# Patient Record
Sex: Female | Born: 1995 | Race: White | Hispanic: Yes | Marital: Single | Smoking: Never smoker
Health system: Southern US, Community
[De-identification: ages and names within clinical notes are randomized; demographics above are authoritative.]

## PROBLEM LIST (undated history)

## (undated) DIAGNOSIS — E669 Obesity, unspecified: Secondary | ICD-10-CM

## (undated) DIAGNOSIS — K603 Anal fistula, unspecified: Secondary | ICD-10-CM

## (undated) DIAGNOSIS — M549 Dorsalgia, unspecified: Secondary | ICD-10-CM

## (undated) DIAGNOSIS — O24419 Gestational diabetes mellitus in pregnancy, unspecified control: Secondary | ICD-10-CM

## (undated) DIAGNOSIS — N83209 Unspecified ovarian cyst, unspecified side: Secondary | ICD-10-CM

## (undated) DIAGNOSIS — D649 Anemia, unspecified: Secondary | ICD-10-CM

## (undated) DIAGNOSIS — N200 Calculus of kidney: Secondary | ICD-10-CM

## (undated) DIAGNOSIS — G8929 Other chronic pain: Secondary | ICD-10-CM

## (undated) HISTORY — DX: Obesity, unspecified: E66.9

## (undated) HISTORY — DX: Other chronic pain: G89.29

## (undated) HISTORY — DX: Anemia, unspecified: D64.9

## (undated) HISTORY — PX: TONSILLECTOMY: SUR1361

## (undated) HISTORY — PX: ABDOMINAL SURGERY: SHX537

## (undated) HISTORY — PX: ADENOIDECTOMY: SUR15

## (undated) HISTORY — DX: Dorsalgia, unspecified: M54.9

---
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
## 2013-01-14 ENCOUNTER — Encounter (HOSPITAL_COMMUNITY): Payer: Self-pay | Admitting: Nurse Practitioner

## 2013-01-14 ENCOUNTER — Emergency Department (HOSPITAL_COMMUNITY)
Admission: EM | Admit: 2013-01-14 | Discharge: 2013-01-14 | Disposition: A | Discharge status: Home / Self Care | Payer: Medicaid Other | Attending: Emergency Medicine | Admitting: Emergency Medicine

## 2013-01-14 DIAGNOSIS — IMO0002 Reserved for concepts with insufficient information to code with codable children: Secondary | ICD-10-CM | POA: Insufficient documentation

## 2013-01-14 DIAGNOSIS — M5417 Radiculopathy, lumbosacral region: Secondary | ICD-10-CM

## 2013-01-14 DIAGNOSIS — R209 Unspecified disturbances of skin sensation: Secondary | ICD-10-CM | POA: Insufficient documentation

## 2013-01-14 MED ORDER — TRAMADOL HCL 50 MG PO TABS: 50.0000 mg | ORAL_TABLET | Freq: Four times a day (QID) | ORAL | Status: DC | PRN

## 2013-01-14 MED ORDER — IBUPROFEN 600 MG PO TABS: 600.0000 mg | ORAL_TABLET | Freq: Four times a day (QID) | ORAL | Status: DC | PRN

## 2013-01-14 MED ORDER — METHOCARBAMOL 500 MG PO TABS: 1000.0000 mg | ORAL_TABLET | Freq: Four times a day (QID) | ORAL | Status: DC

## 2013-01-14 NOTE — ED Notes (Signed)
Pt c/o lower back pain radiating down R leg. History of back pain was initially treated 3 years ago and it resolved but has been recurring this year. Taking ibuprofen with no relief. Denies new injuries. ambulatory

## 2013-01-14 NOTE — ED Provider Notes (Signed)
Medical screening examination/treatment/procedure(s) were performed by non-physician practitioner and as supervising physician I was immediately available for consultation/collaboration.   Rannie Craney, MD 01/14/13 2324 

## 2013-01-14 NOTE — ED Provider Notes (Signed)
History    This chart was scribed for non-physician practitioner working with Dione Booze, MD by Frederik Pear, ED Scribe. This patient was seen in room TR10C/TR10C and the patient's care was started at 1836.   CSN: [id number]  Arrival date & time 01/14/13  1756   First MD Initiated Contact with Patient 01/14/13 1836      Chief Complaint  Patient presents with  . Back Pain    (Consider location/radiation/quality/duration/timing/severity/associated sxs/prior treatment) The history is provided by the patient. No language interpreter was used.    Isabel Conner is a 17 y.o. female with no h/o of back surgeries who presents to the Emergency Department complaining of gradually worsening, waxing and waning, shooting right lower back pain that is aggravated by lifting her neck, which causes the pain to radiate down her right leg and initially began over the summer, but has significantly worsened and become constant today after work. She reports that she initially injured the area 4 years ago, but the pain went away and didn't reappear until this past summer. She denies any h/o of cancer or use of IV drugs. She reports that she has treated the pain with ibuprofen with no relief. She is allergic to amoxicillin and penicillin.  History reviewed. No pertinent past medical history.  History reviewed. No pertinent past surgical history.  History reviewed. No pertinent family history.  History  Substance Use Topics  . Smoking status: Never Smoker   . Smokeless tobacco: Not on file  . Alcohol Use: No    OB History   Grav Para Term Preterm Abortions TAB SAB Ect Mult Living                  Review of Systems  Constitutional: Negative for fever and unexpected weight change.  Gastrointestinal: Negative for constipation.       Negative for fecal incontinence.   Genitourinary: Negative for dysuria, hematuria, flank pain, vaginal bleeding, vaginal discharge and pelvic pain.       Negative for  urinary incontinence or retention.  Musculoskeletal: Positive for back pain.  Neurological: Positive for numbness. Negative for weakness.       Denies saddle paresthesias.  All other systems reviewed and are negative.    Allergies  Amoxicillin and Penicillins  Home Medications   Current Outpatient Rx  Name  Route  Sig  Dispense  Refill  . ibuprofen (ADVIL,MOTRIN) 200 MG tablet   Oral   Take 400-600 mg by mouth every 6 (six) hours as needed for pain.           BP 116/67  Pulse 87  Temp(Src) 99.2 F (37.3 C) (Oral)  Resp 18  SpO2 100%  Physical Exam  Nursing note and vitals reviewed. Constitutional: She appears well-developed and well-nourished. No distress.  Awake, alert, nontoxic appearance.  HENT:  Head: Normocephalic and atraumatic.  Eyes: Conjunctivae and EOM are normal. Pupils are equal, round, and reactive to light.  Neck: Normal range of motion. Neck supple. No tracheal deviation present.  Cardiovascular: Normal rate.   Pulmonary/Chest: Effort normal. No respiratory distress. She exhibits no tenderness.  Abdominal: Soft. She exhibits no distension. There is no tenderness. There is no rebound and no CVA tenderness.  Musculoskeletal: Normal range of motion. She exhibits no edema and no tenderness.  Pain over the right lumbar paraspinous muscles.  Normal DTRs. Normal sensation.  Neurological: She is alert. She has normal strength. She displays normal reflexes. No sensory deficit.  5/5 strength in entire  lower extremities bilaterally. No sensation deficit.   Skin: Skin is warm and dry. No rash noted.  Psychiatric: She has a normal mood and affect. Her behavior is normal.    ED Course  Procedures (including critical care time)  DIAGNOSTIC STUDIES: Oxygen Saturation is 100% on room air, normal by my interpretation.    COORDINATION OF CARE:  19:16- Discussed planned course of treatment with the patient, including Ultram, Advil, and Robaxin, who is agreeable at  this time.   Labs Reviewed - No data to display No results found.   1. Lumbosacral radiculopathy     Vital signs reviewed and are as follows: Filed Vitals:   01/14/13 1814  BP: 116/67  Pulse: 87  Temp: 99.2 F (37.3 C)  Resp: 18   No red flag s/s of low back pain. Patient was counseled on back pain precautions and told to do activity as tolerated but do not lift, push, or pull heavy objects more than 10 pounds for the next week.  Patient counseled to use ice or heat on back for no longer than 15 minutes every hour.   Patient prescribed muscle relaxer and counseled on proper use of muscle relaxant medication.    Patient prescribed narcotic pain medicine and counseled on proper use of narcotic pain medications. Counseled not to combine this medication with others containing tylenol.   Urged patient not to drink alcohol, drive, or perform any other activities that requires focus while taking either of these medications.  Patient urged to follow-up with PCP if pain does not improve with treatment and rest or if pain becomes recurrent. Urged to return with worsening severe pain, loss of bowel or bladder control, trouble walking.   The patient verbalizes understanding and agrees with the plan.   MDM  Patient with radicular type back pain. No neurological deficits. Patient is ambulatory. No warning symptoms of back pain including: loss of bowel or bladder control, night sweats, waking from sleep with back pain, unexplained fevers or weight loss, h/o cancer, IVDU, recent trauma. No concern for cauda equina, epidural abscess, or other serious cause of back pain. Conservative measures such as rest, ice/heat and pain medicine indicated with PCP follow-up if no improvement with conservative management.    I personally performed the services described in this documentation, which was scribed in my presence. The recorded information has been reviewed and is accurate.    Renne Crigler,  [place] 01/14/13 2316

## 2013-01-17 ENCOUNTER — Ambulatory Visit: Payer: Medicaid Other | Admitting: *Deleted

## 2013-01-25 ENCOUNTER — Encounter: Payer: Self-pay | Admitting: *Deleted

## 2013-01-25 ENCOUNTER — Encounter: Payer: Medicaid Other | Attending: Family Medicine | Admitting: *Deleted

## 2013-01-25 DIAGNOSIS — Z713 Dietary counseling and surveillance: Secondary | ICD-10-CM | POA: Insufficient documentation

## 2013-01-25 DIAGNOSIS — E669 Obesity, unspecified: Secondary | ICD-10-CM | POA: Insufficient documentation

## 2013-01-25 NOTE — Patient Instructions (Addendum)
Plan:  Aim for 4 Carb Choices per meal (60 grams) +/- 1 either way  Aim for 0-2 Carbs per snack if hungry  Consider reading food labels for Total Carbohydrate of foods Consider  increasing your activity level by swimming or walking for 15-30 minutes daily as tolerated Consider decreasing soda and increase water as tolerated

## 2013-01-25 NOTE — Progress Notes (Signed)
  Medical Nutrition Therapy:  Appt start time: 1030 end time:  1130.  Assessment:  Primary concerns today: patient here for obesity. Would like a plan to follow for weight loss to goal of 150 pounds. Lives with father and little brother and temporarily her grandmother. Father shops for food, patient or grandmother prepare meals. Sleeps from 11 PM to 6:30 AM. 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 Work times vary from 16 to 24 hours a week. At home when not working so no schedule to follow for most of the week. Now that weather is better, she is starting to walk more. Prefers outside activities, enjoys swimming. Would also prefer a class over individual. She states history of vegetarian diet, lost 30 pounds last Fall for about a month, but stopped following because she got tired of the food choices and had to cook every day.   MEDICATIONS: see list   DIETARY INTAKE:  Usual eating pattern includes 2 meals and 2 snacks per day.  Everyday foods include 2-3.  Avoided foods include 1-2.    24-hr recall:  B ( AM): skips often  Snk ( AM): occasionally chips or cookies  L ( PM): grandmother cooks: eggs with sliced hotdogs OR spaghetti soup, 12 oz regular soda from 2 liter bottle Snk ( PM): none D ( PM): eggs often, beans, rice or pasta, corn tortillas, 12 oz regular soda Snk ( PM): if cake available OR chocolate milk Beverages: regular soda, chocolate milk, occasional sweet tea  Usual physical activity: walking when weather is nice, lifting boxes at work  Estimated energy needs: 1800 calories 200 g carbohydrates 135 g protein 50 g fat  Progress Towards Goal(s):  In progress.   Nutritional Diagnosis:  NI-1.5 Excessive energy intake As related to activity level.  As evidenced by BMI of 36.1.    Intervention:  Nutrition counseling for weight loss initiated. Discussed use of Carb Counting as method for portion control. Taught her how to read food labels  and we discussed the benefits of increased activity. She was surprised at the amount of carbohydrate in her regular sodas and how many empty calories they contain.  Plan:  Aim for 4 Carb Choices per meal (60 grams) +/- 1 either way  Aim for 0-2 Carbs per snack if hungry  Consider reading food labels for Total Carbohydrate of foods Consider  increasing your activity level by swimming or walking for 15-30 minutes daily as tolerated Consider decreasing soda and increase water as tolerated  Handouts given during visit include: Carb Counting and Food Label handouts Meal Plan Card   Monitoring/Evaluation:  Dietary intake, exercise, reading food labels, and body weight in 5 week(s).

## 2013-02-23 ENCOUNTER — Encounter: Payer: Self-pay | Admitting: Family Medicine

## 2013-02-23 DIAGNOSIS — E669 Obesity, unspecified: Secondary | ICD-10-CM | POA: Insufficient documentation

## 2013-02-23 DIAGNOSIS — M549 Dorsalgia, unspecified: Secondary | ICD-10-CM | POA: Insufficient documentation

## 2013-02-26 ENCOUNTER — Ambulatory Visit (INDEPENDENT_AMBULATORY_CARE_PROVIDER_SITE_OTHER): Payer: Medicaid Other | Admitting: Physician Assistant

## 2013-02-26 VITALS — BP 126/70 | HR 72 | Temp 98.2°F | Resp 18 | Ht 63.0 in | Wt 204.0 lb

## 2013-02-26 DIAGNOSIS — IMO0001 Reserved for inherently not codable concepts without codable children: Secondary | ICD-10-CM

## 2013-02-26 DIAGNOSIS — Z309 Encounter for contraceptive management, unspecified: Secondary | ICD-10-CM

## 2013-02-26 MED ORDER — DESOGESTREL-ETHINYL ESTRADIOL 0.15-0.02/0.01 MG (21/5) PO TABS: 1.0000 | ORAL_TABLET | Freq: Every day | ORAL | Status: DC

## 2013-02-27 NOTE — Progress Notes (Signed)
   Patient ID: Isabel Conner MRN: [id number], DOB: 04/20/1996, 17 y.o. Date of Encounter: 02/27/2013, 9:37 AM    Chief Complaint:  Chief Complaint  Patient presents with  . wants to discuss birth control     HPI: 17 y.o. year old female has never used any type of contaception. Wants to start contraception. Is currently on menses-started 4 days ago.  Menses occur regularly Q 27-29 days. She has no significant pain/cramping with menses and has "normal" amount of bleeding.  No h/o blood clots. No family h/o blood clot. She does not smoke. Does not have HTN.     Home Meds: Current Outpatient Prescriptions on File Prior to Visit  Medication Sig Dispense Refill  . ibuprofen (ADVIL,MOTRIN) 600 MG tablet Take 1 tablet (600 mg total) by mouth every 6 (six) hours as needed for pain.  20 tablet  0  . methocarbamol (ROBAXIN) 500 MG tablet Take 2 tablets (1,000 mg total) by mouth 4 (four) times daily.  20 tablet  0  . traMADol (ULTRAM) 50 MG tablet Take 1 tablet (50 mg total) by mouth every 6 (six) hours as needed for pain.  15 tablet  0   No current facility-administered medications on file prior to visit.    Allergies:  Allergies  Allergen Reactions  . Amoxicillin Anaphylaxis  . Penicillins Anaphylaxis      Review of Systems: Constitutional: negative for chills, fever, night sweats, weight changes, or fatigue  HEENT: negative for vision changes, hearing loss, congestion, rhinorrhea, ST, epistaxis, or sinus pressure Cardiovascular: negative for chest pain or palpitations Respiratory: negative for hemoptysis, wheezing, shortness of breath, or cough Abdominal: negative for abdominal pain, nausea, vomiting, diarrhea, or constipation Dermatological: negative for rash Neurologic: negative for headache, dizziness, or syncope    Physical Exam: Blood pressure 126/70, pulse 72, temperature 98.2 F (36.8 C), temperature source Oral, resp. rate 18, height 5\' 3"  (1.6 m), weight 204 lb  (92.534 kg), last menstrual period 02/22/2013., Body mass index is 36.15 kg/(m^2). General: Well developed, well nourished, in no acute distress. Neck: Supple. No thyromegaly. Full ROM. No lymphadenopathy. Lungs: Clear bilaterally to auscultation without wheezes, rales, or rhonchi. Breathing is unlabored. Heart: RRR with S1 S2. No murmurs, rubs, or gallops appreciated. Abdomen: Soft, non-tender, non-distended with normoactive bowel sounds. No hepatomegaly. No rebound/guarding. No obvious abdominal masses. Msk:  Strength and tone normal for age. Extremities/Skin: Warm and dry. No clubbing or cyanosis. No edema. No rashes or suspicious lesions. Neuro: Alert and oriented X 3. Moves all extremities spontaneously. Gait is normal. CNII-XII grossly in tact. Psych:  Responds to questions appropriately with a normal affect.     ASSESSMENT AND PLAN:  17 y.o. year old female with  1. Contraception Discussed options. She chooses OCT/BCP. I discussed to take daily at same time of day. Discussed when to expect menses, etc. She voices understanding. Will need to start pap smear at age 17. - desogestrel-ethinyl estradiol (KARIVA,AZURETTE,MIRCETTE) 0.15-0.02/0.01 MG (21/5) tablet; Take 1 tablet by mouth daily.  Dispense: 1 Package; Refill: [address] [place], [place], [facility] 02/27/2013 9:37 AM

## 2013-03-05 ENCOUNTER — Ambulatory Visit: Payer: Medicaid Other | Admitting: *Deleted

## 2013-04-03 ENCOUNTER — Encounter (HOSPITAL_COMMUNITY): Payer: Self-pay | Admitting: *Deleted

## 2013-04-03 ENCOUNTER — Emergency Department (HOSPITAL_COMMUNITY)
Admission: EM | Admit: 2013-04-03 | Discharge: 2013-04-04 | Disposition: A | Discharge status: Home / Self Care | Payer: Medicaid Other | Attending: Emergency Medicine | Admitting: Emergency Medicine

## 2013-04-03 DIAGNOSIS — R509 Fever, unspecified: Secondary | ICD-10-CM | POA: Insufficient documentation

## 2013-04-03 DIAGNOSIS — Z8739 Personal history of other diseases of the musculoskeletal system and connective tissue: Secondary | ICD-10-CM | POA: Insufficient documentation

## 2013-04-03 DIAGNOSIS — J3489 Other specified disorders of nose and nasal sinuses: Secondary | ICD-10-CM | POA: Insufficient documentation

## 2013-04-03 DIAGNOSIS — E669 Obesity, unspecified: Secondary | ICD-10-CM | POA: Insufficient documentation

## 2013-04-03 DIAGNOSIS — H9191 Unspecified hearing loss, right ear: Secondary | ICD-10-CM

## 2013-04-03 DIAGNOSIS — J329 Chronic sinusitis, unspecified: Secondary | ICD-10-CM | POA: Insufficient documentation

## 2013-04-03 DIAGNOSIS — H902 Conductive hearing loss, unspecified: Secondary | ICD-10-CM | POA: Insufficient documentation

## 2013-04-03 DIAGNOSIS — R0982 Postnasal drip: Secondary | ICD-10-CM | POA: Insufficient documentation

## 2013-04-03 DIAGNOSIS — J029 Acute pharyngitis, unspecified: Secondary | ICD-10-CM | POA: Insufficient documentation

## 2013-04-03 DIAGNOSIS — H9209 Otalgia, unspecified ear: Secondary | ICD-10-CM | POA: Insufficient documentation

## 2013-04-03 MED ORDER — CLARITHROMYCIN 250 MG PO TABS
250.0000 mg | ORAL_TABLET | Freq: Once | ORAL | Status: AC
  Administered 2013-04-04: 250 mg via ORAL
  Filled 2013-04-03: qty 1

## 2013-04-03 MED ORDER — CLARITHROMYCIN 250 MG PO TABS: 500.0000 mg | ORAL_TABLET | Freq: Two times a day (BID) | ORAL | Status: DC

## 2013-04-03 NOTE — ED Provider Notes (Signed)
History     CSN: [id number]  Arrival date & time 04/03/13  2248   First MD Initiated Contact with Patient 04/03/13 2327      Chief Complaint  Patient presents with  . Headache    (Consider location/radiation/quality/duration/timing/severity/associated sxs/prior treatment) HPI Comments: Patient with a history of seasonal allergies, has been taking her normal.  Allergy, medication for the last 2-3, weeks.  She has had increased congestion, facial pain, decreased hearing in her right ear, low-grade fever.  She has tried taking multiple over-the-counter allergy medicines as well, without any relief.  She is a dull, frontal headache.  Denies individual changes  Patient is a 17 y.o. female presenting with headaches. The history is provided by the patient.  Headache Pain location:  Frontal Quality:  Dull Radiates to:  Does not radiate Severity currently:  5/10 Severity at highest:  5/10 Onset quality:  Gradual Duration:  2 weeks Timing:  Constant Chronicity:  Recurrent Relieved by:  Nothing Associated symptoms: congestion, drainage, ear pain, fever, hearing loss and sore throat   Associated symptoms: no blurred vision, no dizziness and no neck pain   Congestion:    Location:  Nasal   Interferes with sleep: yes     Interferes with eating/drinking: no     Past Medical History  Diagnosis Date  . Obesity   . Obesity   . Chronic back pain     History reviewed. No pertinent past surgical history.  No family history on file.  History  Substance Use Topics  . Smoking status: Never Smoker   . Smokeless tobacco: Never Used  . Alcohol Use: No    OB History   Grav Para Term Preterm Abortions TAB SAB Ect Mult Living                  Review of Systems  Constitutional: Positive for fever. Negative for chills.  HENT: Positive for hearing loss, ear pain, congestion, sore throat, rhinorrhea and postnasal drip. Negative for neck pain.   Eyes: Negative for blurred vision.  Skin:  Negative for pallor.  Neurological: Positive for headaches. Negative for dizziness and weakness.  All other systems reviewed and are negative.    Allergies  Amoxicillin and Penicillins  Home Medications   Current Outpatient Rx  Name  Route  Sig  Dispense  Refill  . desogestrel-ethinyl estradiol (KARIVA,AZURETTE,MIRCETTE) 0.15-0.02/0.01 MG (21/5) tablet   Oral   Take 1 tablet by mouth daily.   1 Package   11   . ibuprofen (ADVIL,MOTRIN) 600 MG tablet   Oral   Take 1 tablet (600 mg total) by mouth every 6 (six) hours as needed for pain.   20 tablet   0   . Loratadine 10 MG CAPS   Oral   Take 10 mg by mouth daily.         . methocarbamol (ROBAXIN) 500 MG tablet   Oral   Take 2 tablets (1,000 mg total) by mouth 4 (four) times daily.   20 tablet   0   . traMADol (ULTRAM) 50 MG tablet   Oral   Take 1 tablet (50 mg total) by mouth every 6 (six) hours as needed for pain.   15 tablet   0     BP 121/75  Pulse 104  Temp(Src) 98.7 F (37.1 C)  Resp 20  SpO2 98%  Physical Exam  Nursing note and vitals reviewed. Constitutional: She is oriented to person, place, and time. She appears well-developed and well-nourished.  HENT:  Head: Normocephalic and atraumatic.  Right Ear: No lacerations. No drainage, swelling or tenderness. A middle ear effusion is present. Decreased hearing is noted.  Left Ear: External ear normal.  Mouth/Throat: Oropharynx is clear and moist.  Eyes: Pupils are equal, round, and reactive to light.  Neck: Normal range of motion.  Cardiovascular: Normal rate.   Abdominal: Soft.  Musculoskeletal: Normal range of motion.  Neurological: She is alert and oriented to person, place, and time.  Skin: Skin is warm.    ED Course  Procedures (including critical care time)  Labs Reviewed - No data to display No results found.   No diagnosis found.    MDM   We'll treat patient for sinusitis, with antibiotic, with her my assistant to to her  penicillin allergy, but her sugar continue her ranitidine on a regular basis.  I will refer her to ENT to evaluate her right ear is healing which has been ongoing for the past one to 2 years        Arman Filter, NP 04/03/13 2343

## 2013-04-03 NOTE — ED Notes (Signed)
The pt is c/o a headache and not able to breath through her nose for 2-3 days

## 2013-04-04 NOTE — ED Provider Notes (Signed)
Medical screening examination/treatment/procedure(s) were performed by non-physician practitioner and as supervising physician I was immediately available for consultation/collaboration.  John-Adam Janith Nielson, M.D.    John-Adam Odell Fasching, MD 04/04/13 0732 

## 2013-04-24 ENCOUNTER — Encounter (HOSPITAL_COMMUNITY): Payer: Self-pay | Admitting: Adult Health

## 2013-04-24 ENCOUNTER — Emergency Department (HOSPITAL_COMMUNITY)
Admission: EM | Admit: 2013-04-24 | Discharge: 2013-04-24 | Disposition: A | Discharge status: Home / Self Care | Payer: Medicaid Other | Attending: Emergency Medicine | Admitting: Emergency Medicine

## 2013-04-24 DIAGNOSIS — Z3202 Encounter for pregnancy test, result negative: Secondary | ICD-10-CM | POA: Insufficient documentation

## 2013-04-24 DIAGNOSIS — E669 Obesity, unspecified: Secondary | ICD-10-CM | POA: Insufficient documentation

## 2013-04-24 DIAGNOSIS — M549 Dorsalgia, unspecified: Secondary | ICD-10-CM | POA: Insufficient documentation

## 2013-04-24 DIAGNOSIS — R51 Headache: Secondary | ICD-10-CM | POA: Insufficient documentation

## 2013-04-24 DIAGNOSIS — G8929 Other chronic pain: Secondary | ICD-10-CM | POA: Insufficient documentation

## 2013-04-24 DIAGNOSIS — Z79899 Other long term (current) drug therapy: Secondary | ICD-10-CM | POA: Insufficient documentation

## 2013-04-24 MED ORDER — IBUPROFEN 200 MG PO TABS
400.0000 mg | ORAL_TABLET | Freq: Once | ORAL | Status: AC
  Administered 2013-04-24: 400 mg via ORAL
  Filled 2013-04-24: qty 2

## 2013-04-24 MED ORDER — KETOROLAC TROMETHAMINE 30 MG/ML IJ SOLN: 30.0000 mg | Freq: Once | INTRAMUSCULAR | Status: DC

## 2013-04-24 MED ORDER — OXYCODONE-ACETAMINOPHEN 5-325 MG PO TABS
1.0000 | ORAL_TABLET | Freq: Once | ORAL | Status: AC
  Administered 2013-04-24: 1 via ORAL
  Filled 2013-04-24: qty 1

## 2013-04-24 MED ORDER — DIPHENHYDRAMINE HCL 50 MG/ML IJ SOLN: 25.0000 mg | Freq: Once | INTRAMUSCULAR | Status: DC

## 2013-04-24 MED ORDER — METOCLOPRAMIDE HCL 5 MG/ML IJ SOLN
10.0000 mg | Freq: Once | INTRAMUSCULAR | Status: AC
  Administered 2013-04-24: 10 mg via INTRAMUSCULAR
  Filled 2013-04-24: qty 2

## 2013-04-24 MED ORDER — DEXAMETHASONE SODIUM PHOSPHATE 10 MG/ML IJ SOLN
10.0000 mg | Freq: Once | INTRAMUSCULAR | Status: AC
  Administered 2013-04-24: 10 mg via INTRAMUSCULAR
  Filled 2013-04-24: qty 1

## 2013-04-24 NOTE — ED Notes (Signed)
Pt ambulating independently w/ steady gait on d/c in no acute distress, A&Ox4.D/c instructions reviewed w/ pt and family - pt and family deny any further questions or concerns at present.  

## 2013-04-24 NOTE — ED Notes (Addendum)
Presents with headache that began last night. Pt described headache as mild and got better with better with Ibuprofen, this morning woke up with a worse headache and light and sound sensitivity. Headache described as throbbing located behind eyes and in temples and radiation into both ears. Pt alert and oreinted, MAEx4. No drift or droop.

## 2013-04-24 NOTE — ED Notes (Signed)
NURSE FIRST ROUNDS : NURSE EXPLAINED DELAY , WAIT TIME AND PROCESS , DENIES PAIN AT THIS TIME , RESPIRATIONS UNLABORED .

## 2013-04-24 NOTE — ED Provider Notes (Signed)
History     CSN: [id number]  Arrival date & time 04/24/13  1717   First MD Initiated Contact with Patient 04/24/13 2201      Chief Complaint  Patient presents with  . Headache   HPI  History provided by the patient. Patient is a 17 year old female history of seasonal allergies who presents with complaints of headache. Headache first started gradually yesterday evening and was improved with ibuprofen. She then awoke this morning with worsening headache. Is primarily over the frontal forehead area. She also complains of a fullness and pressure to her ears especially in her right ear. Patient did take more ibuprofen today but has not had significant improvement. She denies significant allergy symptoms recently. Denies any fever, chills or sweats. Denies any weakness or numbness in extremities. She does have some associated photophobia and generalized fatigue. No other aggravating or alleviating factors. No other associated symptoms.    Past Medical History  Diagnosis Date  . Obesity   . Obesity   . Chronic back pain     History reviewed. No pertinent past surgical history.  History reviewed. No pertinent family history.  History  Substance Use Topics  . Smoking status: Never Smoker   . Smokeless tobacco: Never Used  . Alcohol Use: No    OB History   Grav Para Term Preterm Abortions TAB SAB Ect Mult Living                  Review of Systems  Constitutional: Negative for fever, chills and diaphoresis.  Eyes: Positive for photophobia.  Gastrointestinal: Negative for nausea and vomiting.  Neurological: Positive for light-headedness and headaches.  All other systems reviewed and are negative.    Allergies  Amoxicillin and Penicillins  Home Medications   Current Outpatient Rx  Name  Route  Sig  Dispense  Refill  . clarithromycin (BIAXIN) 250 MG tablet   Oral   Take 250 mg by mouth 2 (two) times daily.         Marland Kitchen desogestrel-ethinyl estradiol (VIORELE)  0.15-0.02/0.01 MG (21/5) tablet   Oral   Take 1 tablet by mouth daily.         Marland Kitchen ibuprofen (ADVIL,MOTRIN) 200 MG tablet   Oral   Take 600 mg by mouth every 6 (six) hours as needed for pain.            BP 106/68  Pulse 77  Temp(Src) 99 F (37.2 C) (Oral)  Resp 14  SpO2 99%  Physical Exam  Nursing note and vitals reviewed. Constitutional: She is oriented to person, place, and time. She appears well-developed and well-nourished. No distress.  HENT:  Head: Normocephalic and atraumatic.  Right Ear: Tympanic membrane normal.  Left Ear: Tympanic membrane normal.  Eyes: Conjunctivae and EOM are normal. Pupils are equal, round, and reactive to light.  Neck: Normal range of motion. Neck supple.  No meningeal signs  Cardiovascular: Normal rate and regular rhythm.   No murmur heard. Pulmonary/Chest: Effort normal and breath sounds normal. No respiratory distress. She has no wheezes. She has no rales.  Abdominal: Soft. There is no tenderness. There is no rigidity, no rebound, no guarding, no CVA tenderness and no tenderness at McBurney's point.  Neurological: She is alert and oriented to person, place, and time. She has normal strength. No cranial nerve deficit or sensory deficit. Gait normal.  Skin: Skin is warm and dry. No rash noted.  Psychiatric: She has a normal mood and affect. Her behavior is normal.  ED Course  Procedures       1. Headache       MDM  Patient seen and evaluated. Patient will appearing with normal nonfocal neuro exam.  Pt feeling better ready to return home.      Angus Seller, PA-C 04/25/13 0004

## 2013-04-26 NOTE — ED Provider Notes (Signed)
Medical screening examination/treatment/procedure(s) were performed by non-physician practitioner and as supervising physician I was immediately available for consultation/collaboration.   Itzel Lowrimore B. Bernette Mayers, MD 04/26/13 [phone]

## 2013-08-29 ENCOUNTER — Emergency Department (HOSPITAL_COMMUNITY)
Admission: EM | Admit: 2013-08-29 | Discharge: 2013-08-29 | Disposition: A | Discharge status: Home / Self Care | Payer: Medicaid Other | Attending: Emergency Medicine | Admitting: Emergency Medicine

## 2013-08-29 ENCOUNTER — Encounter (HOSPITAL_COMMUNITY): Payer: Self-pay | Admitting: Emergency Medicine

## 2013-08-29 DIAGNOSIS — R112 Nausea with vomiting, unspecified: Secondary | ICD-10-CM | POA: Insufficient documentation

## 2013-08-29 DIAGNOSIS — M549 Dorsalgia, unspecified: Secondary | ICD-10-CM | POA: Insufficient documentation

## 2013-08-29 DIAGNOSIS — R51 Headache: Secondary | ICD-10-CM | POA: Insufficient documentation

## 2013-08-29 DIAGNOSIS — R52 Pain, unspecified: Secondary | ICD-10-CM | POA: Insufficient documentation

## 2013-08-29 DIAGNOSIS — R000 Tachycardia, unspecified: Secondary | ICD-10-CM | POA: Insufficient documentation

## 2013-08-29 DIAGNOSIS — G8929 Other chronic pain: Secondary | ICD-10-CM | POA: Insufficient documentation

## 2013-08-29 DIAGNOSIS — R509 Fever, unspecified: Secondary | ICD-10-CM

## 2013-08-29 DIAGNOSIS — Z880 Allergy status to penicillin: Secondary | ICD-10-CM | POA: Insufficient documentation

## 2013-08-29 DIAGNOSIS — Z3202 Encounter for pregnancy test, result negative: Secondary | ICD-10-CM | POA: Insufficient documentation

## 2013-08-29 LAB — PREGNANCY, URINE: Preg Test, Ur: NEGATIVE

## 2013-08-29 LAB — URINALYSIS, ROUTINE W REFLEX MICROSCOPIC
Bilirubin Urine: NEGATIVE
Glucose, UA: NEGATIVE mg/dL
Hgb urine dipstick: NEGATIVE
Ketones, ur: NEGATIVE mg/dL
Nitrite: NEGATIVE
Protein, ur: NEGATIVE mg/dL
Specific Gravity, Urine: 1.016 (ref 1.005–1.030)
Urobilinogen, UA: 0.2 mg/dL (ref 0.0–1.0)
pH: 7.5 (ref 5.0–8.0)

## 2013-08-29 LAB — URINE MICROSCOPIC-ADD ON

## 2013-08-29 MED ORDER — ACETAMINOPHEN 325 MG PO TABS
650.0000 mg | ORAL_TABLET | Freq: Once | ORAL | Status: AC
  Administered 2013-08-29: 650 mg via ORAL
  Filled 2013-08-29: qty 2

## 2013-08-29 MED ORDER — IBUPROFEN 400 MG PO TABS
600.0000 mg | ORAL_TABLET | Freq: Once | ORAL | Status: AC
  Administered 2013-08-29: 600 mg via ORAL
  Filled 2013-08-29 (×2): qty 1

## 2013-08-29 MED ORDER — SODIUM CHLORIDE 0.9 % IV BOLUS (SEPSIS)
1000.0000 mL | Freq: Once | INTRAVENOUS | Status: AC
  Administered 2013-08-29: 1000 mL via INTRAVENOUS

## 2013-08-29 MED ORDER — ONDANSETRON HCL 4 MG/2ML IJ SOLN
4.0000 mg | Freq: Once | INTRAMUSCULAR | Status: AC
  Administered 2013-08-29: 4 mg via INTRAVENOUS
  Filled 2013-08-29: qty 2

## 2013-08-29 NOTE — ED Notes (Signed)
Dr Juleen China made aware of pt vital signs. sts to ambulate pt. Pt ambulated-steady gait, denies dizziness and lightheadedness. sts "I'm good". MD made aware- pt discharged.

## 2013-08-29 NOTE — ED Provider Notes (Signed)
CSN: [id number]     Arrival date & time 08/29/13  1107 History   First MD Initiated Contact with Patient 08/29/13 1203     Chief Complaint  Patient presents with  . Fever   (Consider location/radiation/quality/duration/timing/severity/associated sxs/prior Treatment) HPI  17 year old female with fever or, myalgias and nausea vomiting. Symptom onset around 6:00 this morning. Diffuse body aches and headache. Subjective fever. Nausea and multiple episodes of vomiting. Initially stomach contents and then after several episodes of vomiting noticed a small amount of blood. No diarrhea. No sick contacts. No urinary complaints. Patient w/ no past medical history. No intervention prior to arrival.  Past Medical History  Diagnosis Date  . Obesity   . Obesity   . Chronic back pain    History reviewed. No pertinent past surgical history. No family history on file. History  Substance Use Topics  . Smoking status: Never Smoker   . Smokeless tobacco: Never Used  . Alcohol Use: No   OB History   Grav Para Term Preterm Abortions TAB SAB Ect Mult Living                 Review of Systems  All systems reviewed and negative, other than as noted in HPI.   Allergies  Amoxicillin and Penicillins  Home Medications   Current Outpatient Rx  Name  Route  Sig  Dispense  Refill  . diphenhydrAMINE (BENADRYL) 25 MG tablet   Oral   Take 25 mg by mouth every 6 (six) hours as needed for itching or allergies.         Marland Kitchen ibuprofen (ADVIL,MOTRIN) 200 MG tablet   Oral   Take 400 mg by mouth every 6 (six) hours as needed for pain.           BP 100/50  Pulse 108  Temp(Src) 102.9 F (39.4 C) (Oral)  Resp 20  SpO2 99% Physical Exam  Nursing note and vitals reviewed. Constitutional: She appears well-developed and well-nourished. No distress.  Laying in bed. NAD. Obese.   HENT:  Head: Normocephalic and atraumatic.  Mouth/Throat: Oropharynx is clear and moist. No oropharyngeal exudate.  Eyes:  Conjunctivae are normal. Pupils are equal, round, and reactive to light. Right eye exhibits no discharge. Left eye exhibits no discharge.  Neck: Normal range of motion. Neck supple.  No nuchal rigidity  Cardiovascular: Normal rate, regular rhythm and normal heart sounds.  Exam reveals no gallop and no friction rub.   No murmur heard. tachcyardic  Pulmonary/Chest: Effort normal and breath sounds normal. No respiratory distress.  Abdominal: Soft. She exhibits no distension. There is no tenderness.  Genitourinary:  No cva tenderness  Musculoskeletal: She exhibits no edema and no tenderness.  Neurological: She is alert. She exhibits normal muscle tone. Coordination normal.  Skin: Skin is warm and dry. She is not diaphoretic.  Psychiatric: She has a normal mood and affect. Her behavior is normal. Thought content normal.    ED Course  Procedures (including critical care time) Labs Review Labs Reviewed  URINALYSIS, ROUTINE W REFLEX MICROSCOPIC - Abnormal; Notable for the following:    APPearance CLOUDY (*)    Leukocytes, UA SMALL (*)    All other components within normal limits  URINE MICROSCOPIC-ADD ON - Abnormal; Notable for the following:    Squamous Epithelial / LPF FEW (*)    Bacteria, UA FEW (*)    All other components within normal limits  URINE CULTURE  PREGNANCY, URINE   Imaging Review No results found.  MDM   1. Febrile illness, acute     17 year old female with fever, body aches and nausea vomiting. Suspect viral illness. Nonfocal physical exam. Patient is nontoxic appearing. Tachycardia mproved with IV fluids and antipyretics. Mild hypotension noted but pt reports improved symptoms. Consider sepsis/bacteremia, but doubt. Suspect that the blood she noticed was from a small Mallory-Weiss tear. Abdominal exam is benign. No further vomiting.  No respiratory distress. UA with few bacteria/leuks, but also some squam cells. Not overwhelmingly convincing of UTI and w/o specific  urinary complaints will defer from tx at this time. Culture sent. Will tx symptomatically. Return precautions discussed.     Raeford Razor, MD 08/29/13 1525

## 2013-08-29 NOTE — ED Notes (Signed)
Fever and body aches since this am denies dysuria has had some vomiting  That had blood in it

## 2013-09-01 LAB — URINE CULTURE: Colony Count: >=100000

## 2013-09-02 ENCOUNTER — Telehealth (HOSPITAL_COMMUNITY): Payer: Self-pay | Admitting: Emergency Medicine

## 2013-09-02 NOTE — Progress Notes (Signed)
ED Antimicrobial Stewardship Positive Culture Follow Up   Isabel Conner is an 17 y.o. female who presented to [facility] on 08/29/2013 with a chief complaint of  Chief Complaint  Patient presents with  . Fever    Recent Results (from the past 720 hour(s))  URINE CULTURE     Status: None   Collection Time    08/29/13  1:48 PM      Result Value Range Status   Specimen Description URINE, RANDOM   Final   Special Requests NONE   Final   Culture  Setup Time     Final   Value: 08/29/2013 15:12     Performed at [facility] Count     Final   Value: >=100,000 COLONIES/ML     Performed at [facility]   Culture     Final   Value: ESCHERICHIA COLI     Performed at [facility]   Report Status 09/01/2013 FINAL   Final   Organism ID, Bacteria ESCHERICHIA COLI   Final    []  Treated with , organism resistant to prescribed antimicrobial [x]  Patient discharged originally without antimicrobial agent and treatment is now indicated  New antibiotic prescription: Cipro 250 mg PO every 12 hours for 3 days  ED Provider: Fayrene Helper, PA-C  Isabel Conner 09/02/2013, 11:06 AM Infectious Diseases Pharmacist Phone# [phone]

## 2013-09-02 NOTE — ED Notes (Signed)
Post ED Visit - Positive Culture Follow-up: Successful Patient Follow-Up  Culture assessed and recommendations reviewed by: []  Wes Dulaney, Pharm.D., BCPS []  Celedonio Miyamoto, Pharm.D., BCPS []  Georgina Pillion, Pharm.D., BCPS []  [place], [address].D., BCPS, AAHIVP []  Estella Husk, Pharm.D., BCPS, AAHIVP [x]  Abran Duke, [address].D., BCPS  Positive urine culture  [x]  Patient discharged without antimicrobial prescription and treatment is now indicated []  Organism is resistant to prescribed ED discharge antimicrobial []  Patient with positive blood cultures  Changes discussed with ED provider: Fayrene Helper PA-C New antibiotic prescription: Cipro 250 mg PO every 12 hrs for 3 days    Kylie A Holland 09/02/2013, 11:57 AM

## 2014-06-22 ENCOUNTER — Encounter (HOSPITAL_COMMUNITY): Payer: Self-pay | Admitting: Emergency Medicine

## 2014-06-22 ENCOUNTER — Emergency Department (HOSPITAL_COMMUNITY)
Admission: EM | Admit: 2014-06-22 | Discharge: 2014-06-22 | Disposition: A | Discharge status: Home / Self Care | Payer: Medicaid Other | Attending: Emergency Medicine | Admitting: Emergency Medicine

## 2014-06-22 DIAGNOSIS — K529 Noninfective gastroenteritis and colitis, unspecified: Secondary | ICD-10-CM

## 2014-06-22 DIAGNOSIS — Z880 Allergy status to penicillin: Secondary | ICD-10-CM | POA: Insufficient documentation

## 2014-06-22 DIAGNOSIS — G8929 Other chronic pain: Secondary | ICD-10-CM | POA: Insufficient documentation

## 2014-06-22 DIAGNOSIS — Z79899 Other long term (current) drug therapy: Secondary | ICD-10-CM | POA: Insufficient documentation

## 2014-06-22 DIAGNOSIS — E669 Obesity, unspecified: Secondary | ICD-10-CM | POA: Insufficient documentation

## 2014-06-22 DIAGNOSIS — Z791 Long term (current) use of non-steroidal anti-inflammatories (NSAID): Secondary | ICD-10-CM | POA: Insufficient documentation

## 2014-06-22 DIAGNOSIS — K5289 Other specified noninfective gastroenteritis and colitis: Secondary | ICD-10-CM | POA: Insufficient documentation

## 2014-06-22 DIAGNOSIS — R112 Nausea with vomiting, unspecified: Secondary | ICD-10-CM | POA: Insufficient documentation

## 2014-06-22 LAB — COMPREHENSIVE METABOLIC PANEL
ALT: 12 U/L (ref 0–35)
ANION GAP: 17 — AB (ref 5–15)
AST: 16 U/L (ref 0–37)
Albumin: 3.7 g/dL (ref 3.5–5.2)
Alkaline Phosphatase: 74 U/L (ref 39–117)
BUN: 7 mg/dL (ref 6–23)
CALCIUM: 9.1 mg/dL (ref 8.4–10.5)
CO2: 22 mEq/L (ref 19–32)
Chloride: 99 mEq/L (ref 96–112)
Creatinine, Ser: 0.52 mg/dL (ref 0.50–1.10)
GFR calc non Af Amer: >90 mL/min (ref >90)
GLUCOSE: 108 mg/dL — AB (ref 70–99)
POTASSIUM: 3.3 meq/L — AB (ref 3.7–5.3)
SODIUM: 138 meq/L (ref 137–147)
Total Bilirubin: 0.4 mg/dL (ref 0.3–1.2)
Total Protein: 7.5 g/dL (ref 6.0–8.3)

## 2014-06-22 LAB — CBC
HCT: 36.8 % (ref 36.0–46.0)
HEMOGLOBIN: 12 g/dL (ref 12.0–15.0)
MCH: 26.3 pg (ref 26.0–34.0)
MCHC: 32.6 g/dL (ref 30.0–36.0)
MCV: 80.7 fL (ref 78.0–100.0)
Platelets: 209 10*3/uL (ref 150–400)
RBC: 4.56 MIL/uL (ref 3.87–5.11)
RDW: 14.1 % (ref 11.5–15.5)
WBC: 14.3 10*3/uL — ABNORMAL HIGH (ref 4.0–10.5)

## 2014-06-22 LAB — HCG, SERUM, QUALITATIVE: Preg, Serum: NEGATIVE

## 2014-06-22 LAB — I-STAT CG4 LACTIC ACID, ED: LACTIC ACID, VENOUS: 1.36 mmol/L (ref 0.5–2.2)

## 2014-06-22 LAB — LIPASE, BLOOD: Lipase: 11 U/L (ref 11–59)

## 2014-06-22 MED ORDER — ONDANSETRON 4 MG PO TBDP: 4.0000 mg | ORAL_TABLET | Freq: Three times a day (TID) | ORAL | Status: DC | PRN

## 2014-06-22 MED ORDER — ACETAMINOPHEN 325 MG PO TABS
650.0000 mg | ORAL_TABLET | Freq: Once | ORAL | Status: AC
  Administered 2014-06-22: 650 mg via ORAL
  Filled 2014-06-22: qty 2

## 2014-06-22 MED ORDER — DICYCLOMINE HCL 20 MG PO TABS: 20.0000 mg | ORAL_TABLET | Freq: Two times a day (BID) | ORAL | Status: DC

## 2014-06-22 MED ORDER — DIPHENOXYLATE-ATROPINE 2.5-0.025 MG PO TABS: 1.0000 | ORAL_TABLET | Freq: Four times a day (QID) | ORAL | Status: DC | PRN

## 2014-06-22 MED ORDER — ONDANSETRON HCL 4 MG/2ML IJ SOLN
4.0000 mg | Freq: Once | INTRAMUSCULAR | Status: AC
  Administered 2014-06-22: 4 mg via INTRAVENOUS
  Filled 2014-06-22: qty 2

## 2014-06-22 MED ORDER — DIPHENOXYLATE-ATROPINE 2.5-0.025 MG PO TABS
2.0000 | ORAL_TABLET | Freq: Once | ORAL | Status: AC
  Administered 2014-06-22: 2 via ORAL
  Filled 2014-06-22: qty 2

## 2014-06-22 MED ORDER — SODIUM CHLORIDE 0.9 % IV BOLUS (SEPSIS)
1000.0000 mL | Freq: Once | INTRAVENOUS | Status: AC
  Administered 2014-06-22: 1000 mL via INTRAVENOUS

## 2014-06-22 NOTE — ED Notes (Signed)
Pt refused lab work at this time.

## 2014-06-22 NOTE — ED Notes (Signed)
Pt in c/o diarrhea for 5 days, today developed fever and n/v, generalized fatigue, body aches, no distress noted

## 2014-06-22 NOTE — Discharge Instructions (Signed)
Viral Gastroenteritis Viral gastroenteritis is also called stomach flu. This illness is caused by a certain type of germ (virus). It can cause sudden watery poop (diarrhea) and throwing up (vomiting). This can cause you to lose body fluids (dehydration). This illness usually lasts for 3 to 8 days. It usually goes away on its own. HOME CARE   Drink enough fluids to keep your pee (urine) clear or pale yellow. Drink small amounts of fluids often.  Ask your doctor how to replace body fluid losses (rehydration).  Avoid:  Foods high in sugar.  Alcohol.  Bubbly (carbonated) drinks.  Tobacco.  Juice.  Caffeine drinks.  Very hot or cold fluids.  Fatty, greasy foods.  Eating too much at one time.  Dairy products until 24 to 48 hours after your watery poop stops.  You may eat foods with active cultures (probiotics). They can be found in some yogurts and supplements.  Wash your hands well to avoid spreading the illness.  Only take medicines as told by your doctor. Do not give aspirin to children. Do not take medicines for watery poop (antidiarrheals).  Ask your doctor if you should keep taking your regular medicines.  Keep all doctor visits as told. GET HELP RIGHT AWAY IF:   You cannot keep fluids down.  You do not pee at least once every 6 to 8 hours.  You are short of breath.  You see blood in your poop or throw up. This may look like coffee grounds.  You have belly (abdominal) pain that gets worse or is just in one small spot (localized).  You keep throwing up or having watery poop.  You have a fever.  The patient is a child younger than 3 months, and he or she has a fever.  The patient is a child older than 3 months, and he or she has a fever and problems that do not go away.  The patient is a child older than 3 months, and he or she has a fever and problems that suddenly get worse.  The patient is a baby, and he or she has no tears when crying. MAKE SURE YOU:     Understand these instructions.  Will watch your condition.  Will get help right away if you are not doing well or get worse. Document Released: 05/03/2008 Document Revised: 02/07/2012 Document Reviewed: 09/01/2011 ExitCare Patient Information 2015 [employer]. This information is not intended to replace advice given to you by your health care provider. Make sure you discuss any questions you have with your health care provider.  

## 2014-06-28 NOTE — ED Provider Notes (Signed)
CSN: [id number]     Arrival date & time 06/22/14  1431 History   First MD Initiated Contact with Patient 06/22/14 1712     Chief Complaint  Patient presents with  . N/V/D      HPI  She presents with several days of diarrhea. Fever nausea and vomiting feels weak all over body aches presents here. No difficulty breathing. No skin rash. No dysuria. Denies pregnancy. No foreign travel.  Past Medical History  Diagnosis Date  . Obesity   . Obesity   . Chronic back pain    History reviewed. No pertinent past surgical history. History reviewed. No pertinent family history. History  Substance Use Topics  . Smoking status: Never Smoker   . Smokeless tobacco: Never Used  . Alcohol Use: No   OB History   Grav Para Term Preterm Abortions TAB SAB Ect Mult Living                 Review of Systems  Constitutional: Negative for fever, chills, diaphoresis, appetite change and fatigue.  HENT: Negative for mouth sores, sore throat and trouble swallowing.   Eyes: Negative for visual disturbance.  Respiratory: Negative for cough, chest tightness, shortness of breath and wheezing.   Cardiovascular: Negative for chest pain.  Gastrointestinal: Positive for nausea, vomiting and diarrhea. Negative for abdominal pain and abdominal distention.  Endocrine: Negative for polydipsia, polyphagia and polyuria.  Genitourinary: Negative for dysuria, frequency and hematuria.  Musculoskeletal: Negative for gait problem.  Skin: Negative for color change, pallor and rash.  Neurological: Negative for dizziness, syncope, light-headedness and headaches.  Hematological: Does not bruise/bleed easily.  Psychiatric/Behavioral: Negative for behavioral problems and confusion.      Allergies  Amoxicillin and Penicillins  Home Medications   Prior to Admission medications   Medication Sig Start Date End Date Taking? Authorizing Provider  acetaminophen (TYLENOL) 325 MG tablet Take 650 mg by mouth every 6 (six)  hours as needed for mild pain or moderate pain.    Yes Historical Provider, MD  naproxen sodium (ANAPROX) 220 MG tablet Take 220 mg by mouth 2 (two) times daily as needed (pain).   Yes Historical Provider, MD  dicyclomine (BENTYL) 20 MG tablet Take 1 tablet (20 mg total) by mouth 2 (two) times daily. 06/22/14   Rolland PorterMark Alazne Quant, MD  diphenoxylate-atropine (LOMOTIL) 2.5-0.025 MG per tablet Take 1 tablet by mouth 4 (four) times daily as needed for diarrhea or loose stools. 06/22/14   Rolland PorterMark Chrissi Crow, MD  ondansetron (ZOFRAN ODT) 4 MG disintegrating tablet Take 1 tablet (4 mg total) by mouth every 8 (eight) hours as needed for nausea. 06/22/14   Rolland PorterMark Kareena Arrambide, MD   BP 114/68  Pulse 110  Temp(Src) 102.6 F (39.2 C) (Oral)  Resp 20  Ht 5\' 3"  (1.6 m)  Wt 230 lb (104.327 kg)  BMI 40.75 kg/m2  SpO2 97%  LMP 06/11/2014 Physical Exam  Constitutional: She is oriented to person, place, and time. She appears well-developed and well-nourished. No distress.  HENT:  Head: Normocephalic.  Eyes: Conjunctivae are normal. Pupils are equal, round, and reactive to light. No scleral icterus.  Neck: Normal range of motion. Neck supple. No thyromegaly present.  Cardiovascular: Normal rate and regular rhythm.  Exam reveals no gallop and no friction rub.   No murmur heard. Pulmonary/Chest: Effort normal and breath sounds normal. No respiratory distress. She has no wheezes. She has no rales.  Abdominal: Soft. Bowel sounds are normal. She exhibits no distension. There is no tenderness.  There is no rebound.  Complains of mild diffuse tenderness. No guarding or rebound.  Musculoskeletal: Normal range of motion.  Neurological: She is alert and oriented to person, place, and time.  Skin: Skin is warm and dry. No rash noted.  Psychiatric: She has a normal mood and affect. Her behavior is normal.    ED Course  Procedures (including critical care time) Labs Review Labs Reviewed  CBC - Abnormal; Notable for the following:    WBC  14.3 (*)    All other components within normal limits  COMPREHENSIVE METABOLIC PANEL - Abnormal; Notable for the following:    Potassium 3.3 (*)    Glucose, Bld 108 (*)    Anion gap 17 (*)    All other components within normal limits  LIPASE, BLOOD  HCG, SERUM, QUALITATIVE  I-STAT CG4 LACTIC ACID, ED    Imaging Review No results found.   EKG Interpretation None      MDM   Final diagnoses:  Gastroenteritis    Labs reassuring. Patient is appropriate for outpatient treatment. Antiemetics. Fluid hydration.    Rolland Porter, MD 06/28/14 0010

## 2015-11-17 ENCOUNTER — Emergency Department (HOSPITAL_COMMUNITY)
Admission: EM | Admit: 2015-11-17 | Discharge: 2015-11-17 | Disposition: A | Discharge status: Home / Self Care | Payer: Medicaid Other | Attending: Emergency Medicine | Admitting: Emergency Medicine

## 2015-11-17 ENCOUNTER — Encounter (HOSPITAL_COMMUNITY): Payer: Self-pay | Admitting: Emergency Medicine

## 2015-11-17 DIAGNOSIS — Z880 Allergy status to penicillin: Secondary | ICD-10-CM | POA: Insufficient documentation

## 2015-11-17 DIAGNOSIS — M545 Low back pain: Secondary | ICD-10-CM | POA: Insufficient documentation

## 2015-11-17 DIAGNOSIS — G8929 Other chronic pain: Secondary | ICD-10-CM | POA: Insufficient documentation

## 2015-11-17 DIAGNOSIS — Z9889 Other specified postprocedural states: Secondary | ICD-10-CM | POA: Insufficient documentation

## 2015-11-17 DIAGNOSIS — E669 Obesity, unspecified: Secondary | ICD-10-CM | POA: Insufficient documentation

## 2015-11-17 DIAGNOSIS — Z79899 Other long term (current) drug therapy: Secondary | ICD-10-CM | POA: Insufficient documentation

## 2015-11-17 DIAGNOSIS — M549 Dorsalgia, unspecified: Secondary | ICD-10-CM

## 2015-11-17 MED ORDER — NAPROXEN 500 MG PO TABS: 500.0000 mg | ORAL_TABLET | Freq: Two times a day (BID) | ORAL | Status: DC

## 2015-11-17 MED ORDER — METHOCARBAMOL 500 MG PO TABS: 500.0000 mg | ORAL_TABLET | Freq: Two times a day (BID) | ORAL | Status: DC

## 2015-11-17 MED ORDER — NAPROXEN 500 MG PO TABS
500.0000 mg | ORAL_TABLET | Freq: Once | ORAL | Status: AC
  Administered 2015-11-17: 500 mg via ORAL
  Filled 2015-11-17: qty 1

## 2015-11-17 MED ORDER — METHOCARBAMOL 500 MG PO TABS
500.0000 mg | ORAL_TABLET | Freq: Once | ORAL | Status: AC
  Administered 2015-11-17: 500 mg via ORAL
  Filled 2015-11-17: qty 1

## 2015-11-17 NOTE — ED Provider Notes (Signed)
CSN: [id number]     Arrival date & time 11/17/15  1722 History   First MD Initiated Contact with Patient 11/17/15 1739     Chief Complaint  Patient presents with  . Back Pain    HPI   Isabel Conner is a 19 y.o. female with a PMH of chronic back pain who presents to the ED with pain. She states her pain worsened this morning on waking up. She reports movement exacerbates her pain. She has tried over-the-counter pain medication without significant symptom relief. She reports her pain radiates to her right lower extremity. He denies numbness, weakness, bowel or bladder incontinence, saddle anesthesia, history of malignancy, history of IV drug use, anticoagulant use. Patient states she injured herself 6 years ago while reaching down to shave her legs in the shower, and has had back pain since that time.   Past Medical History  Diagnosis Date  . Obesity   . Obesity   . Chronic back pain    History reviewed. No pertinent past surgical history. No family history on file. Social History  Substance Use Topics  . Smoking status: Never Smoker   . Smokeless tobacco: Never Used  . Alcohol Use: No   OB History    No data available      Review of Systems  Musculoskeletal: Positive for back pain.  Neurological: Negative for weakness and numbness.      Allergies  Amoxicillin and Penicillins  Home Medications   Prior to Admission medications   Medication Sig Start Date End Date Taking? Authorizing Provider  acetaminophen (TYLENOL) 325 MG tablet Take 650 mg by mouth every 6 (six) hours as needed for mild pain or moderate pain.     Historical Provider, MD  dicyclomine (BENTYL) 20 MG tablet Take 1 tablet (20 mg total) by mouth 2 (two) times daily. 06/22/14   Rolland Porter, MD  diphenoxylate-atropine (LOMOTIL) 2.5-0.025 MG per tablet Take 1 tablet by mouth 4 (four) times daily as needed for diarrhea or loose stools. 06/22/14   Rolland Porter, MD  methocarbamol (ROBAXIN) 500 MG tablet Take 1  tablet (500 mg total) by mouth 2 (two) times daily. 11/17/15   Mady Gemma, PA-C  naproxen (NAPROSYN) 500 MG tablet Take 1 tablet (500 mg total) by mouth 2 (two) times daily with a meal. 11/17/15   Mady Gemma, PA-C  naproxen sodium (ANAPROX) 220 MG tablet Take 220 mg by mouth 2 (two) times daily as needed (pain).    Historical Provider, MD  ondansetron (ZOFRAN ODT) 4 MG disintegrating tablet Take 1 tablet (4 mg total) by mouth every 8 (eight) hours as needed for nausea. 06/22/14   Rolland Porter, MD    BP 131/62 mmHg  Pulse 59  Temp(Src) 98.1 F (36.7 C) (Oral)  Resp 18  SpO2 100%  LMP 11/17/2015 Physical Exam  Constitutional: She is oriented to person, place, and time. She appears well-developed and well-nourished. No distress.  HENT:  Head: Normocephalic and atraumatic.  Right Ear: External ear normal.  Left Ear: External ear normal.  Nose: Nose normal.  Eyes: Conjunctivae and EOM are normal. Right eye exhibits no discharge. Left eye exhibits no discharge. No scleral icterus.  Neck: Normal range of motion. Neck supple.  Cardiovascular: Normal rate, regular rhythm, normal heart sounds and intact distal pulses.   Pulmonary/Chest: Effort normal and breath sounds normal. No respiratory distress. She has no wheezes. She has no rales.  Abdominal: Soft. Bowel sounds are normal. She exhibits no distension  and no mass. There is no tenderness. There is no rebound and no guarding.  Musculoskeletal: Normal range of motion. She exhibits tenderness. She exhibits no edema.  Mild tenderness to palpation of right lumbar paraspinal muscles. No palpable step-off or deformity. No midline tenderness.  Neurological: She is alert and oriented to person, place, and time. She has normal strength and normal reflexes. No sensory deficit.  Skin: Skin is warm and dry. She is not diaphoretic.  Psychiatric: She has a normal mood and affect. Her behavior is normal.  Nursing note and vitals  reviewed.   ED Course  Procedures (including critical care time)  Labs Review Labs Reviewed - No data to display  Imaging Review No results found.     EKG Interpretation None      MDM   Final diagnoses:  Back pain, unspecified location    19 year old female presents with back pain, which she states started this morning on waking up. She reports chronic back pain s/p injury that occurred 6 years ago. She denies numbness, weakness, bowel or bladder incontinence, saddle anesthesia, history of malignancy, history of IV drug use, anticoagulant use. On exam, patient has tenderness to palpation of her right lumbar paraspinal muscles. No midline tenderness, step-off, or deformity. Strength, sensation, DTRs intact. Patient given ibuprofen and robaxin. Do not feel imaging is indicated at this time. Low suspicion for cauda equina, abscess, hematoma. Patient to follow-up with spine specialist given ongoing symptoms. Will treat with anti-inflammatory and muscle relaxant. Return precautions discussed. Patient verbalizes her understanding and is in agreement with plan.  BP 131/62 mmHg  Pulse 59  Temp(Src) 98.1 F (36.7 C) (Oral)  Resp 18  SpO2 100%  LMP 11/17/2015     Mady GemmaElizabeth C Westfall, PA-C 11/17/15 1759  Leta BaptistEmily Roe Nguyen, MD 11/20/15 [phone]

## 2015-11-17 NOTE — ED Notes (Signed)
Friend at bedside to drive patient home

## 2015-11-17 NOTE — ED Notes (Signed)
Per pt, states chronic back pain-increased pain this am

## 2015-11-17 NOTE — Discharge Instructions (Signed)
1. Medications: naproxen, robaxin, usual home medications 2. Treatment: rest, drink plenty of fluids, heat 3. Follow Up: please followup with spine specialist for discussion of your diagnoses and further evaluation after today's visit; if you do not have a primary care doctor use the resource guide provided to find one; please return to the ER for loss of control of your bowel or bladder, numbness, weakness, new or worsening symptoms   Back Pain, Adult Back pain is very common in adults.The cause of back pain is rarely dangerous and the pain often gets better over time.The cause of your back pain may not be known. Some common causes of back pain include:  Strain of the muscles or ligaments supporting the spine.  Wear and tear (degeneration) of the spinal disks.  Arthritis.  Direct injury to the back. For many people, back pain may return. Since back pain is rarely dangerous, most people can learn to manage this condition on their own. HOME CARE INSTRUCTIONS Watch your back pain for any changes. The following actions may help to lessen any discomfort you are feeling:  Remain active. It is stressful on your back to sit or stand in one place for long periods of time. Do not sit, drive, or stand in one place for more than 30 minutes at a time. Take short walks on even surfaces as soon as you are able.Try to increase the length of time you walk each day.  Exercise regularly as directed by your health care provider. Exercise helps your back heal faster. It also helps avoid future injury by keeping your muscles strong and flexible.  Do not stay in bed.Resting more than 1-2 days can delay your recovery.  Pay attention to your body when you bend and lift. The most comfortable positions are those that put less stress on your recovering back. Always use proper lifting techniques, including:  Bending your knees.  Keeping the load close to your body.  Avoiding twisting.  Find a comfortable  position to sleep. Use a firm mattress and lie on your side with your knees slightly bent. If you lie on your back, put a pillow under your knees.  Avoid feeling anxious or stressed.Stress increases muscle tension and can worsen back pain.It is important to recognize when you are anxious or stressed and learn ways to manage it, such as with exercise.  Take medicines only as directed by your health care provider. Over-the-counter medicines to reduce pain and inflammation are often the most helpful.Your health care provider may prescribe muscle relaxant drugs.These medicines help dull your pain so you can more quickly return to your normal activities and healthy exercise.  Apply ice to the injured area:  Put ice in a plastic bag.  Place a towel between your skin and the bag.  Leave the ice on for 20 minutes, 2-3 times a day for the first 2-3 days. After that, ice and heat may be alternated to reduce pain and spasms.  Maintain a healthy weight. Excess weight puts extra stress on your back and makes it difficult to maintain good posture. SEEK MEDICAL CARE IF:  You have pain that is not relieved with rest or medicine.  You have increasing pain going down into the legs or buttocks.  You have pain that does not improve in one week.  You have night pain.  You lose weight.  You have a fever or chills. SEEK IMMEDIATE MEDICAL CARE IF:   You develop new bowel or bladder control problems.  You  have unusual weakness or numbness in your arms or legs.  You develop nausea or vomiting.  You develop abdominal pain.  You feel faint.   This information is not intended to replace advice given to you by your health care provider. Make sure you discuss any questions you have with your health care provider.   Document Released: 11/15/2005 Document Revised: 12/06/2014 Document Reviewed: 03/19/2014 Elsevier Interactive Patient Education 2016 [employer].   Emergency Department Resource  Guide 1) Find a Doctor and Pay Out of Pocket Although you won't have to find out who is covered by your insurance plan, it is a good idea to ask around and get recommendations. You will then need to call the office and see if the doctor you have chosen will accept you as a new patient and what types of options they offer for patients who are self-pay. Some doctors offer discounts or will set up payment plans for their patients who do not have insurance, but you will need to ask so you aren't surprised when you get to your appointment.  2) Contact Your Local Health Department Not all health departments have doctors that can see patients for sick visits, but many do, so it is worth a call to see if yours does. If you don't know where your local health department is, you can check in your phone book. The CDC also has a tool to help you locate your state's health department, and many state websites also have listings of all of their local health departments.  3) Find a Walk-in Clinic If your illness is not likely to be very severe or complicated, you may want to try a walk in clinic. These are popping up all over the country in pharmacies, drugstores, and shopping centers. They're usually staffed by nurse practitioners or physician assistants that have been trained to treat common illnesses and complaints. They're usually fairly quick and inexpensive. However, if you have serious medical issues or chronic medical problems, these are probably not your best option.  No Primary Care Doctor: - Call Health Connect at  [phone] - they can help you locate a primary care doctor that  accepts your insurance, provides certain services, etc. - Physician Referral Service- [phone]  Chronic Pain Problems: Organization         Address  Phone   Notes  [facility]  [phone] Patients need to be referred by their primary care doctor.   Medication Assistance: Organization          Address  Phone   Notes  [facility] Medication [facility] [address] [place] [place], [place] [address] [phone] --Must be a resident of [facility] -- Must have NO insurance coverage whatsoever (no Medicaid/ Medicare, etc.) -- The pt. MUST have a primary care doctor that directs their care regularly and follows them in the community   Med[facility]ist  [phone]   [employer]  [phone]    Agencies that provide inexpensive medical care: Organization         Address  Phone   Notes  [facility]  [phone]   [facility]    [phone]   [facility] [address] [place], [place] [address] [phone]   [facility] [address] [place] [place] [phone]   Planned Parenthood    [phone]   [facility]    [phone]  [facility]  [address], [place] Phone:  [phone], Fax:  [phone] Hours of Operation:  9 am - 6 pm, M-F.  Also accepts Medicaid/Medicare and self-pay.  [facility]  [address], [place] Phone: [phone], Fax: [phone]. Hours of Operation:  8:30 am - 5:30 pm, M-F.  Also accepts Medicaid and self-pay.  [facility] [place] [address], [place] Phone: [phone]   [facility] [address] Natasha Bence [place], [place] [phone], Ext. [phone] Mondays & Thursdays: 7-9 AM.  First 15 patients are seen on a first come, first serve basis.    Medicaid-accepting [facility] Providers:  Organization         Address  Phone   Notes  [facility] [address], [place] [phone] Also accepts self-pay patients.  [facility] [address] Laurell Josephs [place], [place]  [phone]   [facility] [address], [place] [phone]   [facility] [address] [place] [phone]   Renaye Rakers [address], [place]   [phone] Only accepts [place] Access [place] patients after they have their name applied to their card.   Self-Pay (no insurance) in [facility]:  Organization         Address  Phone   Notes  Sickle Cell Patients, [facility] [address] [place], [place] [phone]   [facility] [address] [place], [place] [phone]   [facility]  [address], [place] [phone]   [facility]/Dr. Osei-Bonsu  [address] [place] or [address], [place] [phone] Phone number for both [place] and [place] locations is the same.  Urgent Medical and [facility] [address] [place] [phone]   [facility] [address] [place] or [address] [phone]   [facility] [address], [place] [phone], phone; [phone], fax Sees patients 1st and 3rd Saturday of every month.  Must not qualify for public or private insurance (i.e. Medicaid, Medicare, [place] Health Choice, Veterans' Benefits)  Household income should be no more than 200% of the poverty level The clinic cannot treat you if you are pregnant or think you are pregnant  Sexually transmitted diseases are not treated at the clinic.    Dental Care: Organization         Address  Phone  Notes  [facility] [address] [place], [place] [phone] Accepts children up to age 77 who are enrolled in [place] or [place] Health Choice; pregnant women with a Medicaid card; and children who have applied for Medicaid or [place] Health Choice, but were declined, whose parents can pay a reduced fee at time of service.  [facility] of [facility]  [address], [place] [phone] Accepts children up to age 41 who are enrolled in [place] or  Health Choice; pregnant women with a Medicaid card; and children who have applied for Medicaid or  Health Choice, but were declined, whose parents can pay a reduced fee at time of service.  [facility]  [phone]  [address] [place], [place] [phone] Patients are seen by appointment only. Walk-ins are not accepted. [facility] will see patients 40 years of age and older. Monday - Tuesday (8am-5pm) Most Wednesdays (8:30-5pm) $30 per visit, cash only  [facility]  [address], [facility] [phone] Patients are seen by appointment only. Walk-ins are not accepted. [facility] will see patients 20 years of age and older. One Wednesday Evening (Monthly: Volunteer Based).  $30 per visit, cash only  [employer]  [phone] for adults; Children under age 21, call Graduate Pediatric Dentistry at [phone]. Children aged 41-14, please call [phone] to request a pediatric application.  Dental services are provided in all areas of dental care including fillings, crowns and bridges, complete and partial dentures, implants, gum treatment, root canals, and extractions. Preventive care is also provided. Treatment is provided to both adults and children. Patients are selected via a lottery and there is often a waiting list.   [facility] [address], [place]  [phone] [email or web address]   [facility] [address] [place], [place] [phone], Ext. [phone] Second and Fourth Thursday of each month, opens at 6:30 AM; Clinic ends at 9 AM.  Patients are seen on a first-come first-served basis, and a limited number are seen during each clinic.   [facility]  [address] Ether Griffins [place], [place] [phone]   Eligibility Requirements You must  have lived in [place], [place], or [place] counties for at least the last three months.   You cannot be eligible for state or federal sponsored [place], including [employer], [place], or [employer].   You generally cannot be eligible for healthcare insurance through your employer.    How to apply: Eligibility screenings are held every Tuesday and Wednesday afternoon from 1:00 pm until 4:00 pm. You do not need an appointment for the interview!  [facility] [address], [place], [place] [phone]   [facility] Health Department  [phone]   [facility] Health Department  [phone]   [facility]  [phone]    Behavioral Health Resources in the Community: Intensive Outpatient Programs Organization         Address  Phone  Notes  [facility] [address], [place], [place] [phone]   [facility] Outpatient [address] [place], [place] [phone]   [facility] [address] [place], [place]  [phone]   [facility] [address]  [place], [place] [phone] or [phone]   Substance Abuse Resources Organization         Address  Phone  Notes  [facility]  [phone]   [facility]  [phone]   [facility]  [phone]   Floydene Flock  [phone]   Residential & Outpatient Substance Abuse Program  [phone]   Psychological Services Organization         Address  Phone  Notes  [facility]  [phone]   [employer] Services  [phone]   [facility] Mental Health [address] [place] [phone] or [phone]    Mobile Crisis Teams Organization         Address  Phone  Notes  [facility], Mobile Crisis Care Unit  [phone]   [facility]  [address] [place], [place] [phone]   Jasmine December  DeEsch [address] [place] [phone]    Self-Help/Support Groups Organization         Address  Phone             Notes  [facility] [place] - variety of support groups  336- I7437963 Call for more information  Narcotics Anonymous (NA), [facility] [address], [employer] [place]  2 meetings at this location   Statistician         Address  Phone  Notes  [facility] [address],    [place]  [phone]   [facility]  [address], [place] [address], [place], [place] [phone]   [facility] [address] [place], [place] [phone] Admissions: 8am-3pm M-F  [facility] [address],    [place], [place] [phone]   [facility] [address] [place], [place], [place] [phone]   [facility] [address]  [place], [place] [phone]   [facility] - Intensive Outpatient [address] Laurell Josephs [address], [place], [place] [phone]   [facility] ([facility]iction [facility] [address] [place]  [place], [place] [phone] or [phone]   [facility] ([facility]) [address] [place], [place] [phone] Accepts Medicaid  [facility] [address]  [place] [phone] Substance Abuse/Addiction Treatment   [facility] Organization         Address  Phone  Notes  [facility]  [phone]   Angie Fava, PhD [address] Ervin Knack [place], [place]   [phone] or [phone]   [facility]   [address] [place], [place] [phone]   [facility] [address], [place], [place] [phone] Insurance/Medicaid/sponsorship through [facility] [address]                                    [place], [place] [phone] Therapy/tele-psych/case  [facility] [address], [place] [phone]    Dr. Lolly Mustache  [phone]   [facility]  [employer] [facility] Dept. 1) [address] [place] 2) [address], [place] 3)  [address], [place] [phone]  [phone]   [facility] Child Abuse Hotline 440-040-2218 or [phone] (After Hours)

## 2015-12-08 ENCOUNTER — Emergency Department (HOSPITAL_COMMUNITY)
Admission: EM | Admit: 2015-12-08 | Discharge: 2015-12-08 | Disposition: A | Discharge status: Home / Self Care | Payer: Medicaid Other | Attending: Emergency Medicine | Admitting: Emergency Medicine

## 2015-12-08 ENCOUNTER — Encounter (HOSPITAL_COMMUNITY): Payer: Self-pay

## 2015-12-08 DIAGNOSIS — Y9389 Activity, other specified: Secondary | ICD-10-CM | POA: Insufficient documentation

## 2015-12-08 DIAGNOSIS — E669 Obesity, unspecified: Secondary | ICD-10-CM | POA: Insufficient documentation

## 2015-12-08 DIAGNOSIS — Z880 Allergy status to penicillin: Secondary | ICD-10-CM | POA: Insufficient documentation

## 2015-12-08 DIAGNOSIS — Z791 Long term (current) use of non-steroidal anti-inflammatories (NSAID): Secondary | ICD-10-CM | POA: Insufficient documentation

## 2015-12-08 DIAGNOSIS — Y998 Other external cause status: Secondary | ICD-10-CM | POA: Insufficient documentation

## 2015-12-08 DIAGNOSIS — S6992XA Unspecified injury of left wrist, hand and finger(s), initial encounter: Secondary | ICD-10-CM | POA: Insufficient documentation

## 2015-12-08 DIAGNOSIS — Y92511 Restaurant or cafe as the place of occurrence of the external cause: Secondary | ICD-10-CM | POA: Insufficient documentation

## 2015-12-08 DIAGNOSIS — Z79899 Other long term (current) drug therapy: Secondary | ICD-10-CM | POA: Insufficient documentation

## 2015-12-08 DIAGNOSIS — G8929 Other chronic pain: Secondary | ICD-10-CM | POA: Insufficient documentation

## 2015-12-08 DIAGNOSIS — Z23 Encounter for immunization: Secondary | ICD-10-CM | POA: Insufficient documentation

## 2015-12-08 DIAGNOSIS — W231XXA Caught, crushed, jammed, or pinched between stationary objects, initial encounter: Secondary | ICD-10-CM | POA: Insufficient documentation

## 2015-12-08 MED ORDER — SULFAMETHOXAZOLE-TRIMETHOPRIM 800-160 MG PO TABS: 1.0000 | ORAL_TABLET | Freq: Two times a day (BID) | ORAL | Status: AC

## 2015-12-08 MED ORDER — TETANUS-DIPHTH-ACELL PERTUSSIS 5-2.5-18.5 LF-MCG/0.5 IM SUSP
0.5000 mL | Freq: Once | INTRAMUSCULAR | Status: AC
  Administered 2015-12-08: 0.5 mL via INTRAMUSCULAR
  Filled 2015-12-08: qty 0.5

## 2015-12-08 MED ORDER — HYDROCODONE-ACETAMINOPHEN 5-325 MG PO TABS: 1.0000 | ORAL_TABLET | Freq: Four times a day (QID) | ORAL | Status: DC | PRN

## 2015-12-08 MED ORDER — HYDROCODONE-ACETAMINOPHEN 5-325 MG PO TABS
2.0000 | ORAL_TABLET | Freq: Once | ORAL | Status: AC
  Administered 2015-12-08: 2 via ORAL
  Filled 2015-12-08: qty 2

## 2015-12-08 MED ORDER — BACITRACIN ZINC 500 UNIT/GM EX OINT
1.0000 "application " | TOPICAL_OINTMENT | Freq: Two times a day (BID) | CUTANEOUS | Status: DC
  Administered 2015-12-08: 1 via TOPICAL
  Filled 2015-12-08: qty 0.9

## 2015-12-08 MED ORDER — LIDOCAINE HCL (PF) 1 % IJ SOLN
5.0000 mL | Freq: Once | INTRAMUSCULAR | Status: AC
  Administered 2015-12-08: 5 mL
  Filled 2015-12-08: qty 5

## 2015-12-08 NOTE — Discharge Instructions (Signed)
Nail Avulsion Injury °Nail avulsion means that you have lost the whole, or part of a nail. The nail will usually grow back in 2 to 6 months. If your injury damaged the growth center of the nail, the nail may be deformed, split, or not stuck to the nail bed. Sometimes the avulsed nail is stitched back in place. This provides temporary protection to the nail bed until the new nail grows in.  °HOME CARE INSTRUCTIONS  °· Raise (elevate) your injury as much as possible. °· Protect the injury and cover it with bandages (dressings) or splints as instructed. °· Change dressings as instructed. °SEEK MEDICAL CARE IF:  °· There is increasing pain, redness, or swelling. °· You cannot move your fingers or toes. °  °This information is not intended to replace advice given to you by your health care provider. Make sure you discuss any questions you have with your health care provider. °  °Document Released: 12/23/2004 Document Revised: 02/07/2012 Document Reviewed: 10/17/2009 °Elsevier Interactive Patient Education ©2016 Elsevier Inc. ° °

## 2015-12-08 NOTE — ED Notes (Signed)
Pt c/o L ring finger injury.  Pain score 10/10.  Pt reports that she was attempting to sit down in a chair, was punctured by a piece of metal, and sat on her finger while it was punctured by the metal.  Bleeding is currently controlled.  Last tetanus unknown.

## 2015-12-08 NOTE — ED Provider Notes (Signed)
CSN: [id number]     Arrival date & time 12/08/15  1631 History  By signing my name below, I, Soijett Blue, attest that this documentation has been prepared under the direction and in the presence of Roxy Horseman, PA-C Electronically Signed: Soijett Blue, ED Scribe. 12/08/2015. 5:00 PM.   Chief Complaint  Patient presents with  . Finger Injury      The history is provided by the patient. No language interpreter was used.    Isabel Conner is a 20 y.o. female who presents to the Emergency Department complaining of left ring finger injury onset 30 minutes ago. She reports that she crushed her left ring finger while trying to move a chair while at a restaurant and her finger was caught in the metal frame of the chair while attempting to sit down. She is unsure of the status of her tetanus at this time.  She states that she has tried applying pressure for the relief for her symptoms. She denies color change, swelling, and any other symptoms.    Past Medical History  Diagnosis Date  . Obesity   . Obesity   . Chronic back pain    History reviewed. No pertinent past surgical history. History reviewed. No pertinent family history. Social History  Substance Use Topics  . Smoking status: Never Smoker   . Smokeless tobacco: Never Used  . Alcohol Use: No   OB History    No data available     Review of Systems  Musculoskeletal: Positive for arthralgias. Negative for joint swelling.  Skin: Positive for wound. Negative for color change.      Allergies  Amoxicillin and Penicillins  Home Medications   Prior to Admission medications   Medication Sig Start Date End Date Taking? Authorizing Provider  acetaminophen (TYLENOL) 325 MG tablet Take 650 mg by mouth every 6 (six) hours as needed for mild pain or moderate pain.     Historical Provider, MD  dicyclomine (BENTYL) 20 MG tablet Take 1 tablet (20 mg total) by mouth 2 (two) times daily. 06/22/14   Rolland Porter, MD   diphenoxylate-atropine (LOMOTIL) 2.5-0.025 MG per tablet Take 1 tablet by mouth 4 (four) times daily as needed for diarrhea or loose stools. 06/22/14   Rolland Porter, MD  methocarbamol (ROBAXIN) 500 MG tablet Take 1 tablet (500 mg total) by mouth 2 (two) times daily. 11/17/15   Mady Gemma, PA-C  naproxen (NAPROSYN) 500 MG tablet Take 1 tablet (500 mg total) by mouth 2 (two) times daily with a meal. 11/17/15   Mady Gemma, PA-C  naproxen sodium (ANAPROX) 220 MG tablet Take 220 mg by mouth 2 (two) times daily as needed (pain).    Historical Provider, MD  ondansetron (ZOFRAN ODT) 4 MG disintegrating tablet Take 1 tablet (4 mg total) by mouth every 8 (eight) hours as needed for nausea. 06/22/14   Rolland Porter, MD   BP 126/78 mmHg  Pulse 96  Temp(Src) 98.1 F (36.7 C) (Oral)  Resp 16  SpO2 98%  LMP 11/17/2015 Physical Exam Physical Exam  Constitutional: Pt is oriented to person, place, and time. Appears well-developed and well-nourished. No distress.  HENT:  Head: Normocephalic and atraumatic.  Eyes: Conjunctivae are normal. No scleral icterus.  Neck: Normal range of motion.  Cardiovascular: Normal rate, regular rhythm, normal heart sounds and intact distal pulses.   No murmur heard. Capillary refill < 3 sec  Pulmonary/Chest: Effort normal and breath sounds normal. No respiratory distress.  Musculoskeletal: Normal range  of motion. Exhibits no edema.  ROM: 5/5  Neurological: Pt is alert and oriented to person, place, and time.  Sensation: 5/5 Strength: 5/5  Skin: Skin is warm and dry. Pt. is not diaphoretic. Left ring fingernail has laceration through the distal aspect of the fingernail. Psychiatric: Pt has a normal mood and affect.  Nursing note and vitals reviewed.   ED Course  .Nail Removal Date/Time: 12/08/2015 5:52 PM Performed by: Roxy HorsemanBROWNING, Jerrel Tiberio Authorized by: Roxy HorsemanBROWNING, Veneta Sliter Consent: Verbal consent obtained. Risks and benefits: risks, benefits and alternatives  were discussed Consent given by: patient Patient understanding: patient states understanding of the procedure being performed Patient consent: the patient's understanding of the procedure matches consent given Procedure consent: procedure consent matches procedure scheduled Relevant documents: relevant documents present and verified Test results: test results available and properly labeled Site marked: the operative site was marked Imaging studies: imaging studies available Required items: required blood products, implants, devices, and special equipment available Patient identity confirmed: verbally with patient Time out: Immediately prior to procedure a "time out" was called to verify the correct patient, procedure, equipment, support staff and site/side marked as required. Location: left hand Location details: left ring finger Anesthesia: digital block Local anesthetic: lidocaine 1% without epinephrine Anesthetic total: 3 ml Patient sedated: no Preparation: skin prepped with alcohol Amount removed: partial Side: ulnar Wedge excision of skin of nail fold: no Nail bed sutured: yes Nail bed suture material: 5-0 vicryl plus. Number of sutures: 1 Nail matrix removed: none Removed nail replaced and anchored: no Dressing: splint and antibiotic ointment Patient tolerance: Patient tolerated the procedure well with no immediate complications   (including critical care time) DIAGNOSTIC STUDIES: Oxygen Saturation is 98% on RA, nl by my interpretation.    COORDINATION OF CARE: 4:59 PM Discussed treatment plan with pt at bedside and pt agreed to plan.      MDM   Final diagnoses:  Injury of tip of finger, left, initial encounter    Distal fingertip injury. Doubt any bone involvement, as the injury is quite distal. Underlying fracture not completely ruled out without imaging, but I find this to be very unlikely. Patient does not have tenderness over the DIP. Fingernail removed  partially, and laceration of nailbed repaired. Bacitracin applied with finger splint. Will give antibiotics, update tetanus, and discharge with hand follow-up.  I personally performed the services described in this documentation, which was scribed in my presence. The recorded information has been reviewed and is accurate.      Roxy Horsemanobert Karinna Beadles, PA-C 12/08/15 1756  Loren Raceravid Yelverton, MD 12/09/15 [id number]

## 2015-12-08 NOTE — Progress Notes (Addendum)
Pt states she see providers at [facility] and will return there if needed  CM provided her with a list of [place] medicaid providers to assist to find pcp services prn  Pt appreciative and pleasant CM joked with them because pt was upset with female visitor causing her pain

## 2016-02-29 ENCOUNTER — Emergency Department (HOSPITAL_COMMUNITY)
Admission: EM | Admit: 2016-02-29 | Discharge: 2016-02-29 | Disposition: A | Discharge status: Home / Self Care | Payer: Medicaid Other | Attending: Physician Assistant | Admitting: Physician Assistant

## 2016-02-29 ENCOUNTER — Encounter (HOSPITAL_COMMUNITY): Payer: Self-pay

## 2016-02-29 DIAGNOSIS — Z880 Allergy status to penicillin: Secondary | ICD-10-CM | POA: Insufficient documentation

## 2016-02-29 DIAGNOSIS — Z79899 Other long term (current) drug therapy: Secondary | ICD-10-CM | POA: Insufficient documentation

## 2016-02-29 DIAGNOSIS — B9689 Other specified bacterial agents as the cause of diseases classified elsewhere: Secondary | ICD-10-CM

## 2016-02-29 DIAGNOSIS — E669 Obesity, unspecified: Secondary | ICD-10-CM | POA: Insufficient documentation

## 2016-02-29 DIAGNOSIS — N760 Acute vaginitis: Secondary | ICD-10-CM | POA: Insufficient documentation

## 2016-02-29 DIAGNOSIS — Z791 Long term (current) use of non-steroidal anti-inflammatories (NSAID): Secondary | ICD-10-CM | POA: Insufficient documentation

## 2016-02-29 DIAGNOSIS — G8929 Other chronic pain: Secondary | ICD-10-CM | POA: Insufficient documentation

## 2016-02-29 DIAGNOSIS — N390 Urinary tract infection, site not specified: Secondary | ICD-10-CM | POA: Insufficient documentation

## 2016-02-29 DIAGNOSIS — Z3202 Encounter for pregnancy test, result negative: Secondary | ICD-10-CM | POA: Insufficient documentation

## 2016-02-29 DIAGNOSIS — R112 Nausea with vomiting, unspecified: Secondary | ICD-10-CM | POA: Insufficient documentation

## 2016-02-29 LAB — COMPREHENSIVE METABOLIC PANEL
ALT: 13 U/L — ABNORMAL LOW (ref 14–54)
ANION GAP: 9 (ref 5–15)
AST: 21 U/L (ref 15–41)
Albumin: 4.2 g/dL (ref 3.5–5.0)
Alkaline Phosphatase: 66 U/L (ref 38–126)
BUN: 11 mg/dL (ref 6–20)
CHLORIDE: 108 mmol/L (ref 101–111)
CO2: 22 mmol/L (ref 22–32)
Calcium: 8.9 mg/dL (ref 8.9–10.3)
Creatinine, Ser: 0.57 mg/dL (ref 0.44–1.00)
GFR calc Af Amer: >60 mL/min (ref >60)
GFR calc non Af Amer: >60 mL/min (ref >60)
GLUCOSE: 119 mg/dL — AB (ref 65–99)
POTASSIUM: 3.9 mmol/L (ref 3.5–5.1)
SODIUM: 139 mmol/L (ref 135–145)
TOTAL PROTEIN: 7.8 g/dL (ref 6.5–8.1)
Total Bilirubin: 0.5 mg/dL (ref 0.3–1.2)

## 2016-02-29 LAB — WET PREP, GENITAL
SPERM: NONE SEEN
TRICH WET PREP: NONE SEEN
YEAST WET PREP: NONE SEEN

## 2016-02-29 LAB — CBC
HCT: 41.4 % (ref 36.0–46.0)
Hemoglobin: 14 g/dL (ref 12.0–15.0)
MCH: 27.9 pg (ref 26.0–34.0)
MCHC: 33.8 g/dL (ref 30.0–36.0)
MCV: 82.5 fL (ref 78.0–100.0)
Platelets: 208 10*3/uL (ref 150–400)
RBC: 5.02 MIL/uL (ref 3.87–5.11)
RDW: 13.1 % (ref 11.5–15.5)
WBC: 16.9 10*3/uL — ABNORMAL HIGH (ref 4.0–10.5)

## 2016-02-29 LAB — URINALYSIS, ROUTINE W REFLEX MICROSCOPIC
Bilirubin Urine: NEGATIVE
Glucose, UA: NEGATIVE mg/dL
Hgb urine dipstick: NEGATIVE
Ketones, ur: NEGATIVE mg/dL
Nitrite: NEGATIVE
Protein, ur: NEGATIVE mg/dL
Specific Gravity, Urine: 1.024 (ref 1.005–1.030)
pH: 6 (ref 5.0–8.0)

## 2016-02-29 LAB — URINE MICROSCOPIC-ADD ON

## 2016-02-29 LAB — I-STAT BETA HCG BLOOD, ED (MC, WL, AP ONLY): I-stat hCG, quantitative: <5 m[IU]/mL (ref <5)

## 2016-02-29 LAB — LIPASE, BLOOD: LIPASE: 21 U/L (ref 11–51)

## 2016-02-29 MED ORDER — ONDANSETRON HCL 4 MG/2ML IJ SOLN
4.0000 mg | Freq: Once | INTRAMUSCULAR | Status: DC
  Filled 2016-02-29: qty 2

## 2016-02-29 MED ORDER — SODIUM CHLORIDE 0.9 % IV BOLUS (SEPSIS): 1000.0000 mL | Freq: Once | INTRAVENOUS | Status: DC

## 2016-02-29 MED ORDER — ONDANSETRON 4 MG PO TBDP
4.0000 mg | ORAL_TABLET | Freq: Once | ORAL | Status: AC | PRN
  Administered 2016-02-29: 4 mg via ORAL
  Filled 2016-02-29: qty 1

## 2016-02-29 MED ORDER — FENTANYL CITRATE (PF) 100 MCG/2ML IJ SOLN
50.0000 ug | INTRAMUSCULAR | Status: DC | PRN
  Administered 2016-02-29: 50 ug via NASAL
  Filled 2016-02-29: qty 2

## 2016-02-29 MED ORDER — METRONIDAZOLE 500 MG PO TABS: 500.0000 mg | ORAL_TABLET | Freq: Two times a day (BID) | ORAL | Status: DC

## 2016-02-29 MED ORDER — NITROFURANTOIN MONOHYD MACRO 100 MG PO CAPS: 100.0000 mg | ORAL_CAPSULE | Freq: Two times a day (BID) | ORAL | Status: DC

## 2016-02-29 NOTE — ED Provider Notes (Signed)
CSN: [id number]     Arrival date & time 02/29/16  1655 History   First MD Initiated Contact with Patient 02/29/16 2042     Chief Complaint  Patient presents with  . Abdominal Pain  . Emesis   HPI  Isabel Conner is a 20 year old female with PMHx of chronic back pain and obesity presenting with abdominal pain. Onset of pain was 3 days. She describes them as period cramps over her lower abdomen. She states that she is not due for her period for another week. She denies vaginal bleeding. Her cramping has been worsening over the past 24 hours. She denies exacerbating or alleviating factors. She has tried over-the-counter gas relief pills. She reports associated nausea, vomiting and vaginal discharge. She describes a white, creamy discharge. She denies dysuria, hematuria, malodorous urine, or pelvic pain. She denies concerns for STD exposures.  Past Medical History  Diagnosis Date  . Obesity   . Obesity   . Chronic back pain    History reviewed. No pertinent past surgical history. No family history on file. Social History  Substance Use Topics  . Smoking status: Never Smoker   . Smokeless tobacco: Never Used  . Alcohol Use: 1.8 oz/week    3 Standard drinks or equivalent per week   OB History    No data available     Review of Systems  All other systems reviewed and are negative.     Allergies  Amoxicillin; Penicillins; and Aleve  Home Medications   Prior to Admission medications   Medication Sig Start Date End Date Taking? Authorizing Provider  ibuprofen (ADVIL,MOTRIN) 200 MG tablet Take 600-800 mg by mouth every 6 (six) hours as needed for headache or moderate pain.   Yes Historical Provider, MD  Simethicone (GAS-X PO) Take 2 tablets by mouth daily as needed (gas).   Yes Historical Provider, MD  methocarbamol (ROBAXIN) 500 MG tablet Take 1 tablet (500 mg total) by mouth 2 (two) times daily. 11/17/15   Mady Gemma, PA-C  metroNIDAZOLE (FLAGYL) 500 MG tablet Take 1 tablet  (500 mg total) by mouth 2 (two) times daily. 02/29/16   Theodore Virgin, PA-C  naproxen (NAPROSYN) 500 MG tablet Take 1 tablet (500 mg total) by mouth 2 (two) times daily with a meal. 11/17/15   Mady Gemma, PA-C  nitrofurantoin, macrocrystal-monohydrate, (MACROBID) 100 MG capsule Take 1 capsule (100 mg total) by mouth 2 (two) times daily. 02/29/16   Arah Aro, PA-C   BP 128/78 mmHg  Pulse 121  Temp(Src) 98.2 F (36.8 C) (Oral)  Resp 18  Ht  (1.6 m)  Wt 95.255 kg  BMI 37.21 kg/m2  SpO2 100%  LMP 01/31/2016 Physical Exam  Constitutional: She appears well-developed and well-nourished. No distress.  Obese. Nontoxic appearing.  HENT:  Head: Normocephalic and atraumatic.  Right Ear: External ear normal.  Left Ear: External ear normal.  Eyes: Conjunctivae are normal. Right eye exhibits no discharge. Left eye exhibits no discharge. No scleral icterus.  Neck: Normal range of motion.  Cardiovascular: Normal rate.   Pulmonary/Chest: Effort normal.  Abdominal: Soft. Normal appearance and bowel sounds are normal. There is tenderness in the suprapubic area. There is no rigidity, no rebound and no guarding.  Suprapubic tenderness. No peritoneal signs.  Genitourinary: Cervix exhibits no motion tenderness, no discharge and no friability. Vaginal discharge found.  Musculoskeletal: Normal range of motion.  Moves all extremities spontaneously  Lymphadenopathy:       Right: No inguinal adenopathy  present.       Left: No inguinal adenopathy present.  Neurological: She is alert. Coordination normal.  Skin: Skin is warm and dry.  Psychiatric: She has a normal mood and affect. Her behavior is normal.  Nursing note and vitals reviewed.   ED Course  Procedures (including critical care time) Labs Review Labs Reviewed  WET PREP, GENITAL - Abnormal; Notable for the following:    Clue Cells Wet Prep HPF POC PRESENT (*)    WBC, Wet Prep HPF POC RARE (*)    All other components within  normal limits  COMPREHENSIVE METABOLIC PANEL - Abnormal; Notable for the following:    Glucose, Bld 119 (*)    ALT 13 (*)    All other components within normal limits  CBC - Abnormal; Notable for the following:    WBC 16.9 (*)    All other components within normal limits  URINALYSIS, ROUTINE W REFLEX MICROSCOPIC (NOT [facility]) - Abnormal; Notable for the following:    APPearance TURBID (*)    Leukocytes, UA MODERATE (*)    All other components within normal limits  URINE MICROSCOPIC-ADD ON - Abnormal; Notable for the following:    Squamous Epithelial / LPF TOO NUMEROUS TO COUNT (*)    Bacteria, UA MANY (*)    All other components within normal limits  URINE CULTURE  LIPASE, BLOOD  RPR  HIV ANTIBODY (ROUTINE TESTING)  I-STAT BETA HCG BLOOD, ED ([facility], [facility], [facility] ONLY)  GC/CHLAMYDIA PROBE AMP ([place]) NOT AT [facility]    Imaging Review No results found. I have personally reviewed and evaluated these images and lab results as part of my medical decision-making.   EKG Interpretation None      MDM   Final diagnoses:  UTI (lower urinary tract infection)  BV (bacterial vaginosis)   20 year old female presenting with lower abdominal cramping, nausea, vomiting and vaginal discharge. Afebrile and nontoxic appearing. Patient is tachycardic to 120. Denies chest pain, shortness breath or palpitations. Attempted to give bolus and IV infiltrated. Patient does not want an IV and is requesting PO fluids. Patient is hemodynamically stable and in no acute distress so I do not believe that she needs an IV. Suprapubic tenderness without peritoneal signs. Copious amounts  of white discharge noted on pelvic. No CMT or adnexal tenderness. Leukocytosis. Urinalysis positive for infection. Clue cells on wet prep. We'll treat with Flagyl and Macrobid. Patient is to follow-up with her PCP as needed. Return precautions given in discharge paperwork and discussed with pt at bedside. Pt stable for  discharge    Alveta HeimlichStevi Megean Fabio, PA-C 02/29/16 2349  Courteney Lyn Mackuen, MD 02/29/16 2353

## 2016-02-29 NOTE — Discharge Instructions (Signed)
Bacterial Vaginosis °Bacterial vaginosis is a vaginal infection that occurs when the normal balance of bacteria in the vagina is disrupted. It results from an overgrowth of certain bacteria. This is the most common vaginal infection in women of childbearing age. Treatment is important to prevent complications, especially in pregnant women, as it can cause a premature delivery. °CAUSES  °Bacterial vaginosis is caused by an increase in harmful bacteria that are normally present in smaller amounts in the vagina. Several different kinds of bacteria can cause bacterial vaginosis. However, the reason that the condition develops is not fully understood. °RISK FACTORS °Certain activities or behaviors can put you at an increased risk of developing bacterial vaginosis, including: °· Having a new sex partner or multiple sex partners. °· Douching. °· Using an intrauterine device (IUD) for contraception. °Women do not get bacterial vaginosis from toilet seats, bedding, swimming pools, or contact with objects around them. °SIGNS AND SYMPTOMS  °Some women with bacterial vaginosis have no signs or symptoms. Common symptoms include: °· Grey vaginal discharge. °· A fishlike odor with discharge, especially after sexual intercourse. °· Itching or burning of the vagina and vulva. °· Burning or pain with urination. °DIAGNOSIS  °Your health care provider will take a medical history and examine the vagina for signs of bacterial vaginosis. A sample of vaginal fluid may be taken. Your health care provider will look at this sample under a microscope to check for bacteria and abnormal cells. A vaginal pH test may also be done.  °TREATMENT  °Bacterial vaginosis may be treated with antibiotic medicines. These may be given in the form of a pill or a vaginal cream. A second round of antibiotics may be prescribed if the condition comes back after treatment. Because bacterial vaginosis increases your risk for sexually transmitted diseases, getting  treated can help reduce your risk for chlamydia, gonorrhea, HIV, and herpes. °HOME CARE INSTRUCTIONS  °· Only take over-the-counter or prescription medicines as directed by your health care provider. °· If antibiotic medicine was prescribed, take it as directed. Make sure you finish it even if you start to feel better. °· Tell all sexual partners that you have a vaginal infection. They should see their health care provider and be treated if they have problems, such as a mild rash or itching. °· During treatment, it is important that you follow these instructions: °· Avoid sexual activity or use condoms correctly. °· Do not douche. °· Avoid alcohol as directed by your health care provider. °· Avoid breastfeeding as directed by your health care provider. °SEEK MEDICAL CARE IF:  °· Your symptoms are not improving after 3 days of treatment. °· You have increased discharge or pain. °· You have a fever. °MAKE SURE YOU:  °· Understand these instructions. °· Will watch your condition. °· Will get help right away if you are not doing well or get worse. °FOR MORE INFORMATION  °Centers for Disease Control and Prevention, Division of STD Prevention: www.cdc.gov/std °American Sexual Health Association (ASHA): www.ashastd.org  °  °This information is not intended to replace advice given to you by your health care provider. Make sure you discuss any questions you have with your health care provider. °  °Document Released: 11/15/2005 Document Revised: 12/06/2014 Document Reviewed: 06/27/2013 °Elsevier Interactive Patient Education ©2016 Elsevier Inc[employer] ° °Urinary Tract Infection °Urinary tract infections (UTIs) can develop anywhere along your urinary tract. Your urinary tract is your body's drainage system for removing wastes and extra water. Your urinary tract includes two kidneys, two ureters,   a bladder, and a urethra. Your kidneys are a pair of bean-shaped organs. Each kidney is about the size of your fist. They are located below  your ribs, one on each side of your spine. °CAUSES °Infections are caused by microbes, which are microscopic organisms, including fungi, viruses, and bacteria. These organisms are so small that they can only be seen through a microscope. Bacteria are the microbes that most commonly cause UTIs. °SYMPTOMS  °Symptoms of UTIs may vary by age and gender of the patient and by the location of the infection. Symptoms in young women typically include a frequent and intense urge to urinate and a painful, burning feeling in the bladder or urethra during urination. Older women and men are more likely to be tired, shaky, and weak and have muscle aches and abdominal pain. A fever may mean the infection is in your kidneys. Other symptoms of a kidney infection include pain in your back or sides below the ribs, nausea, and vomiting. °DIAGNOSIS °To diagnose a UTI, your caregiver will ask you about your symptoms. Your caregiver will also ask you to provide a urine sample. The urine sample will be tested for bacteria and white blood cells. White blood cells are made by your body to help fight infection. °TREATMENT  °Typically, UTIs can be treated with medication. Because most UTIs are caused by a bacterial infection, they usually can be treated with the use of antibiotics. The choice of antibiotic and length of treatment depend on your symptoms and the type of bacteria causing your infection. °HOME CARE INSTRUCTIONS °· If you were prescribed antibiotics, take them exactly as your caregiver instructs you. Finish the medication even if you feel better after you have only taken some of the medication. °· Drink enough water and fluids to keep your urine clear or pale yellow. °· Avoid caffeine, tea, and carbonated beverages. They tend to irritate your bladder. °· Empty your bladder often. Avoid holding urine for long periods of time. °· Empty your bladder before and after sexual intercourse. °· After a bowel movement, women should cleanse  from front to back. Use each tissue only once. °SEEK MEDICAL CARE IF:  °· You have back pain. °· You develop a fever. °· Your symptoms do not begin to resolve within 3 days. °SEEK IMMEDIATE MEDICAL CARE IF:  °· You have severe back pain or lower abdominal pain. °· You develop chills. °· You have nausea or vomiting. °· You have continued burning or discomfort with urination. °MAKE SURE YOU:  °· Understand these instructions. °· Will watch your condition. °· Will get help right away if you are not doing well or get worse. °  °This information is not intended to replace advice given to you by your health care provider. Make sure you discuss any questions you have with your health care provider. °  °Document Released: 08/25/2005 Document Revised: 08/06/2015 Document Reviewed: 12/24/2011 °Elsevier Interactive Patient Education ©2016 Elsevier Inc. ° °

## 2016-02-29 NOTE — ED Notes (Signed)
While obtaining an IV, it infiltrated. Attempted to start another IV, pt would like to hold off until the pelvic exam is performed. Pt reports being nausea. Informed that we had anti-nausea medication via IV if she would allow for an IV. Pt continues to refuse til her pelvic exam is completed.

## 2016-02-29 NOTE — ED Notes (Signed)
Soda brought to pt per MD, pt refused IV.  PT sitting in bed laughing with friend

## 2016-02-29 NOTE — ED Notes (Signed)
Pt c/o period cramps that started 2-3 days ago, but they have gradually become worse and is described as severe cramping. Pt states she has missed her expected period and denies spotting. Pt is having a "cream like" discharge. Pt states today she has been nauseated and has vomited x1.

## 2016-02-29 NOTE — ED Notes (Signed)
Pain medication given in Triage. Patient advised about side effects of medications and  to avoid driving for a minimum of 4 hours.  

## 2016-03-01 LAB — GC/CHLAMYDIA PROBE AMP (~~LOC~~) NOT AT ARMC
Chlamydia: NEGATIVE
NEISSERIA GONORRHEA: NEGATIVE

## 2016-03-02 LAB — URINE CULTURE

## 2016-03-02 LAB — RPR: RPR: NONREACTIVE

## 2016-03-02 LAB — HIV ANTIBODY (ROUTINE TESTING W REFLEX): HIV SCREEN 4TH GENERATION: NONREACTIVE

## 2016-12-03 ENCOUNTER — Ambulatory Visit (HOSPITAL_COMMUNITY)
Admission: EM | Admit: 2016-12-03 | Discharge: 2016-12-03 | Disposition: A | Discharge status: Other Acute Inpatient Hospital | Payer: BLUE CROSS/BLUE SHIELD

## 2017-05-21 ENCOUNTER — Emergency Department (HOSPITAL_COMMUNITY)
Admission: EM | Admit: 2017-05-21 | Discharge: 2017-05-22 | Disposition: A | Discharge status: Home / Self Care | Payer: BLUE CROSS/BLUE SHIELD | Attending: Emergency Medicine | Admitting: Emergency Medicine

## 2017-05-21 ENCOUNTER — Encounter (HOSPITAL_COMMUNITY): Payer: Self-pay | Admitting: *Deleted

## 2017-05-21 DIAGNOSIS — Y929 Unspecified place or not applicable: Secondary | ICD-10-CM | POA: Diagnosis not present

## 2017-05-21 DIAGNOSIS — Y939 Activity, unspecified: Secondary | ICD-10-CM | POA: Insufficient documentation

## 2017-05-21 DIAGNOSIS — R55 Syncope and collapse: Secondary | ICD-10-CM | POA: Insufficient documentation

## 2017-05-21 DIAGNOSIS — W01198A Fall on same level from slipping, tripping and stumbling with subsequent striking against other object, initial encounter: Secondary | ICD-10-CM | POA: Insufficient documentation

## 2017-05-21 DIAGNOSIS — S0081XA Abrasion of other part of head, initial encounter: Secondary | ICD-10-CM | POA: Diagnosis not present

## 2017-05-21 DIAGNOSIS — Y998 Other external cause status: Secondary | ICD-10-CM | POA: Diagnosis not present

## 2017-05-21 LAB — CBC
HCT: 41.1 % (ref 36.0–46.0)
HEMOGLOBIN: 13.3 g/dL (ref 12.0–15.0)
MCH: 27.4 pg (ref 26.0–34.0)
MCHC: 32.4 g/dL (ref 30.0–36.0)
MCV: 84.7 fL (ref 78.0–100.0)
Platelets: 226 10*3/uL (ref 150–400)
RBC: 4.85 MIL/uL (ref 3.87–5.11)
RDW: 13.2 % (ref 11.5–15.5)
WBC: 12 10*3/uL — ABNORMAL HIGH (ref 4.0–10.5)

## 2017-05-21 LAB — BASIC METABOLIC PANEL
ANION GAP: 9 (ref 5–15)
BUN: 12 mg/dL (ref 6–20)
CALCIUM: 9 mg/dL (ref 8.9–10.3)
CO2: 20 mmol/L — ABNORMAL LOW (ref 22–32)
Chloride: 104 mmol/L (ref 101–111)
Creatinine, Ser: 0.65 mg/dL (ref 0.44–1.00)
GFR calc Af Amer: >60 mL/min (ref >60)
GLUCOSE: 161 mg/dL — AB (ref 65–99)
Potassium: 3.5 mmol/L (ref 3.5–5.1)
Sodium: 133 mmol/L — ABNORMAL LOW (ref 135–145)

## 2017-05-21 LAB — CBG MONITORING, ED: GLUCOSE-CAPILLARY: 164 mg/dL — AB (ref 65–99)

## 2017-05-21 LAB — I-STAT BETA HCG BLOOD, ED (MC, WL, AP ONLY)

## 2017-05-21 MED ORDER — SODIUM CHLORIDE 0.9 % IV BOLUS (SEPSIS)
1000.0000 mL | Freq: Once | INTRAVENOUS | Status: AC
  Administered 2017-05-21: 1000 mL via INTRAVENOUS

## 2017-05-21 MED ORDER — SODIUM CHLORIDE 0.9 % IV SOLN: 1000.0000 mL | INTRAVENOUS | Status: DC

## 2017-05-21 NOTE — ED Provider Notes (Signed)
[facility]-EMERGENCY DEPT Provider Note   CSN: [id number] Arrival date & time: 05/21/17  2301     History   Chief Complaint Chief Complaint  Patient presents with  . Loss of Consciousness    HPI Isabel Conner is a 21 y.o. female with a hx of obesity and chronic back pain presents to the Emergency Department complaining of acute syncope onset just PTA.  Patient reports she has been on a diet for the last 4 days and has decreased her by mouth intake to only 1 small meal per day. She reports streaking water but no other fluids. She reports that tonight she was sitting on the bottom stair of her front porch and began feeling lightheaded when she passed out falling forward. Witnesses state that she struck her face on the concrete. They deny seizure activity. Witness reports that patient was immediately alert and oriented after the episode. No loss of bowel or bladder control. No numbness, tingling, weakness. Patient denies vision changes, diarrhea, fever, chills, chest pain, shortness of breath.  The history is provided by the patient and medical records. No language interpreter was used.    Past Medical History:  Diagnosis Date  . Chronic back pain   . Obesity   . Obesity     Patient Active Problem List   Diagnosis Date Noted  . Obesity   . Chronic back pain     Past Surgical History:  Procedure Laterality Date  . ADENOIDECTOMY    . TONSILLECTOMY      OB History    No data available       Home Medications    Prior to Admission medications   Medication Sig Start Date End Date Taking? Authorizing Provider  bacitracin ointment Apply 1 application topically 2 (two) times daily. 05/22/17   Lani Havlik, Dahlia Client, PA-C  ibuprofen (ADVIL,MOTRIN) 200 MG tablet Take 600-800 mg by mouth every 6 (six) hours as needed for headache or moderate pain.    [provider]  methocarbamol (ROBAXIN) 500 MG tablet Take 1 tablet (500 mg total) by mouth 2 (two) times daily. 11/17/15    Mady Gemma, PA-C  metroNIDAZOLE (FLAGYL) 500 MG tablet Take 1 tablet (500 mg total) by mouth 2 (two) times daily. 02/29/16   Barrett, Rolm Gala, PA-C  naproxen (NAPROSYN) 500 MG tablet Take 1 tablet (500 mg total) by mouth 2 (two) times daily with a meal. 11/17/15   Westfall, Dorise Hiss, PA-C  nitrofurantoin, macrocrystal-monohydrate, (MACROBID) 100 MG capsule Take 1 capsule (100 mg total) by mouth 2 (two) times daily. 02/29/16   Barrett, Rolm Gala, PA-C  Simethicone (GAS-X PO) Take 2 tablets by mouth daily as needed (gas).    [provider]    Family History No family history on file.  Social History Social History  Substance Use Topics  . Smoking status: Never Smoker  . Smokeless tobacco: Never Used  . Alcohol use 1.8 oz/week    3 Standard drinks or equivalent per week     Allergies   Amoxicillin; Penicillins; and Aleve [naproxen sodium]   Review of Systems Review of Systems  Constitutional: Negative for appetite change, diaphoresis, fatigue, fever and unexpected weight change.  HENT: Positive for facial swelling. Negative for mouth sores.   Eyes: Negative for visual disturbance.  Respiratory: Negative for cough, chest tightness, shortness of breath and wheezing.   Cardiovascular: Negative for chest pain.  Gastrointestinal: Negative for abdominal pain, constipation, diarrhea, nausea and vomiting.  Endocrine: Negative for polydipsia, polyphagia and polyuria.  Genitourinary: Negative for dysuria, frequency, hematuria and urgency.  Musculoskeletal: Negative for back pain and neck stiffness.  Skin: Negative for rash.  Allergic/Immunologic: Negative for immunocompromised state.  Neurological: Positive for syncope. Negative for light-headedness and headaches.  Hematological: Does not bruise/bleed easily.  Psychiatric/Behavioral: Negative for sleep disturbance. The patient is not nervous/anxious.   All other systems reviewed and are negative.    Physical Exam Updated  Vital Signs BP 116/65   Pulse 92   Temp 98 F (36.7 C) (Oral)   Resp 16   LMP 05/18/2017   SpO2 100%   Physical Exam  Constitutional: She is oriented to person, place, and time. She appears well-developed and well-nourished. No distress.  Awake, alert, nontoxic appearance  HENT:  Head: Normocephalic. Head is with abrasion.  Nose: No nasal deformity, septal deviation or nasal septal hematoma. Epistaxis is observed.  Mouth/Throat: Oropharynx is clear and moist. No oropharyngeal exudate.  Abrasion to the right forehead, nose and lips No bony tenderness to the face Small amount of epistaxis but no evidence of septal deviation or hematoma Teeth intact, no oral lacerations  Eyes: Conjunctivae and EOM are normal. Pupils are equal, round, and reactive to light. No scleral icterus.  No horizontal, vertical or rotational nystagmus  Neck: Normal range of motion. Neck supple.  Full active and passive ROM without pain No midline or paraspinal tenderness No nuchal rigidity or meningeal signs  Cardiovascular: Normal rate, regular rhythm and intact distal pulses.   Pulmonary/Chest: Effort normal and breath sounds normal. No respiratory distress. She has no wheezes. She has no rales.  Equal chest expansion  Abdominal: Soft. Bowel sounds are normal. She exhibits no mass. There is no tenderness. There is no rebound and no guarding.  Musculoskeletal: Normal range of motion. She exhibits no edema.  Lymphadenopathy:    She has no cervical adenopathy.  Neurological: She is alert and oriented to person, place, and time. No cranial nerve deficit. She exhibits normal muscle tone. Coordination normal.  Mental Status:  Alert, oriented, thought content appropriate. Speech fluent without evidence of aphasia. Able to follow 2 step commands without difficulty.  Cranial Nerves:  II:  Peripheral visual fields grossly normal, pupils equal, round, reactive to light III,IV, VI: ptosis not present, extra-ocular  motions intact bilaterally  V,VII: smile symmetric, facial light touch sensation equal VIII: hearing grossly normal bilaterally  IX,X: midline uvula rise  XI: bilateral shoulder shrug equal and strong XII: midline tongue extension  Motor:  5/5 in upper and lower extremities bilaterally including strong and equal grip strength and dorsiflexion/plantar flexion Sensory: Pinprick and light touch normal in all extremities.  Cerebellar: normal finger-to-nose with bilateral upper extremities Gait: normal gait and balance CV: distal pulses palpable throughout   Skin: Skin is warm and dry. No rash noted. She is not diaphoretic.  Psychiatric: She has a normal mood and affect. Her behavior is normal. Judgment and thought content normal.  Nursing note and vitals reviewed.    ED Treatments / Results  Labs (all labs ordered are listed, but only abnormal results are displayed) Labs Reviewed  BASIC METABOLIC PANEL - Abnormal; Notable for the following:       Result Value   Sodium 133 (*)    CO2 20 (*)    Glucose, Bld 161 (*)    All other components within normal limits  CBC - Abnormal; Notable for the following:    WBC 12.0 (*)    All other components within normal limits  URINALYSIS, ROUTINE  W REFLEX MICROSCOPIC - Abnormal; Notable for the following:    Hgb urine dipstick MODERATE (*)    Squamous Epithelial / LPF 0-5 (*)    All other components within normal limits  CBG MONITORING, ED - Abnormal; Notable for the following:    Glucose-Capillary 164 (*)    All other components within normal limits  MAGNESIUM  I-STAT BETA HCG BLOOD, ED ([facility], [facility], [facility] ONLY)    EKG  EKG Interpretation  Date/Time:  Saturday May 21 2017 23:10:18 EDT Ventricular Rate:  106 PR Interval:    QRS Duration: 96 QT Interval:  346 QTC Calculation: 460 R Axis:   71 Text Interpretation:  Sinus tachycardia Borderline Q waves in lateral leads No old tracing to compare Confirmed by Rolan Bucco [phone]) on 05/21/2017  11:31:17 PM       Procedures Procedures (including critical care time)  Medications Ordered in ED Medications  sodium chloride 0.9 % bolus 1,000 mL (0 mLs Intravenous Stopped 05/22/17 0457)    Followed by  sodium chloride 0.9 % bolus 1,000 mL (0 mLs Intravenous Stopped 05/22/17 0457)    Followed by  0.9 %  sodium chloride infusion (1,000 mLs Intravenous Not Given 05/22/17 0505)     Initial Impression / Assessment and Plan / ED Course  I have reviewed the triage vital signs and the nursing notes.  Pertinent labs & imaging results that were available during my care of the patient were reviewed by me and considered in my medical decision making (see chart for details).  Clinical Course as of May 22 649  Sun May 22, 2017  0131 Patient reports she is feeling better. Tachycardia has improved. No hypotension.  [HM]    Clinical Course User Index [HM] Kaleia Longhi, Dahlia Client, PA-C    Patient without arrhythmia while here in the department.  Mild tachycardia resolved after fluids.  Patient without history of congestive heart failure, normal hematocrit, normal ECG, no shortness of breath and systolic blood pressure greater than 90; patient is low risk. Syncope is likely secondary to patient's lack of food intake for the last 4 days. Will plan for discharge home with close cardiology/PCP follow-up.  Possibility of recurrent syncope has been discussed. I discussed reasons to avoid driving until cardiology/PCP followup and other safety prevention including use of ladders and working at heights.   Pt has remained hemodynamically stable throughout their time in the ED  BP 111/61   Pulse 97   Temp 98 F (36.7 C) (Oral)   Resp 20   LMP 05/18/2017   SpO2 95%   Orthostatic VS for the past 24 hrs:  BP- Lying Pulse- Lying BP- Sitting Pulse- Sitting BP- Standing at 0 minutes Pulse- Standing at 0 minutes  05/22/17 0059 120/61 99 117/71 107 123/55 102       Final Clinical Impressions(s) / ED  Diagnoses   Final diagnoses:  Syncope and collapse  Abrasion, face w/o infection    New Prescriptions Discharge Medication List as of 05/22/2017  4:55 AM    START taking these medications   Details  bacitracin ointment Apply 1 application topically 2 (two) times daily., Starting Sun 05/22/2017, Print         Encarnacion Scioneaux, Dahlia Client, PA-C 05/22/17 0652    Ward, Layla Maw, DO 05/22/17 [phone]

## 2017-05-21 NOTE — ED Triage Notes (Signed)
Pt has been fasting on a diet for a few days. Has been having headaches since yesterday. Tonight pt was walking down a couple of steps and had a syncopal episode. Abrasions noted to forehead and nose. Pt a&ox4 on arrival, c/o of facial pain, worse to nose

## 2017-05-22 LAB — URINALYSIS, ROUTINE W REFLEX MICROSCOPIC
BACTERIA UA: NONE SEEN
Bilirubin Urine: NEGATIVE
Glucose, UA: NEGATIVE mg/dL
Ketones, ur: NEGATIVE mg/dL
Leukocytes, UA: NEGATIVE
NITRITE: NEGATIVE
PROTEIN: NEGATIVE mg/dL
SPECIFIC GRAVITY, URINE: 1.013 (ref 1.005–1.030)
pH: 6 (ref 5.0–8.0)

## 2017-05-22 LAB — MAGNESIUM: Magnesium: 2 mg/dL (ref 1.7–2.4)

## 2017-05-22 MED ORDER — BACITRACIN ZINC 500 UNIT/GM EX OINT: 1.0000 "application " | TOPICAL_OINTMENT | Freq: Two times a day (BID) | CUTANEOUS | 0 refills | Status: DC

## 2017-05-22 NOTE — Discharge Instructions (Signed)
1. Medications: usual home medications 2. Treatment: rest, drink plenty of fluids, eat a minimum of 3 meals per day 3. Follow Up: Please followup with your primary doctor in 1-2 days for discussion of your diagnoses and further evaluation after today's visit; if you do not have a primary care doctor use the resource guide provided to find one; Please return to the ER for if you pass out again, have chest pain, difficulty breathing or other concerns.

## 2018-06-08 ENCOUNTER — Ambulatory Visit: Payer: BLUE CROSS/BLUE SHIELD | Admitting: Family Medicine

## 2018-06-19 ENCOUNTER — Encounter: Payer: Self-pay | Admitting: Family Medicine

## 2018-06-27 ENCOUNTER — Ambulatory Visit: Payer: BLUE CROSS/BLUE SHIELD | Admitting: Family Medicine

## 2018-06-28 ENCOUNTER — Encounter: Payer: Self-pay | Admitting: Family Medicine

## 2018-08-28 ENCOUNTER — Emergency Department (HOSPITAL_COMMUNITY)
Admission: EM | Admit: 2018-08-28 | Discharge: 2018-08-29 | Disposition: A | Discharge status: Home / Self Care | Payer: BLUE CROSS/BLUE SHIELD | Attending: Emergency Medicine | Admitting: Emergency Medicine

## 2018-08-28 ENCOUNTER — Emergency Department (HOSPITAL_COMMUNITY): Payer: BLUE CROSS/BLUE SHIELD

## 2018-08-28 ENCOUNTER — Encounter (HOSPITAL_COMMUNITY): Payer: Self-pay

## 2018-08-28 ENCOUNTER — Other Ambulatory Visit: Payer: Self-pay

## 2018-08-28 DIAGNOSIS — R109 Unspecified abdominal pain: Secondary | ICD-10-CM | POA: Diagnosis present

## 2018-08-28 DIAGNOSIS — Z79899 Other long term (current) drug therapy: Secondary | ICD-10-CM | POA: Insufficient documentation

## 2018-08-28 DIAGNOSIS — N760 Acute vaginitis: Secondary | ICD-10-CM | POA: Insufficient documentation

## 2018-08-28 DIAGNOSIS — N83201 Unspecified ovarian cyst, right side: Secondary | ICD-10-CM | POA: Insufficient documentation

## 2018-08-28 DIAGNOSIS — B373 Candidiasis of vulva and vagina: Secondary | ICD-10-CM | POA: Diagnosis not present

## 2018-08-28 DIAGNOSIS — B379 Candidiasis, unspecified: Secondary | ICD-10-CM

## 2018-08-28 DIAGNOSIS — B9689 Other specified bacterial agents as the cause of diseases classified elsewhere: Secondary | ICD-10-CM

## 2018-08-28 LAB — CBC WITH DIFFERENTIAL/PLATELET
Abs Immature Granulocytes: 0.1 10*3/uL (ref 0.0–0.1)
BASOS PCT: 1 %
Basophils Absolute: 0.1 10*3/uL (ref 0.0–0.1)
EOS PCT: 3 %
Eosinophils Absolute: 0.5 10*3/uL (ref 0.0–0.7)
HCT: 42.2 % (ref 36.0–46.0)
Hemoglobin: 13.5 g/dL (ref 12.0–15.0)
Immature Granulocytes: 1 %
Lymphocytes Relative: 22 %
Lymphs Abs: 3.8 10*3/uL (ref 0.7–4.0)
MCH: 28.2 pg (ref 26.0–34.0)
MCHC: 32 g/dL (ref 30.0–36.0)
MCV: 88.3 fL (ref 78.0–100.0)
MONO ABS: 1 10*3/uL (ref 0.1–1.0)
MONOS PCT: 6 %
Neutro Abs: 12.1 10*3/uL — ABNORMAL HIGH (ref 1.7–7.7)
Neutrophils Relative %: 69 %
PLATELETS: 279 10*3/uL (ref 150–400)
RBC: 4.78 MIL/uL (ref 3.87–5.11)
RDW: 12.6 % (ref 11.5–15.5)
WBC: 17.5 10*3/uL — ABNORMAL HIGH (ref 4.0–10.5)

## 2018-08-28 LAB — COMPREHENSIVE METABOLIC PANEL
ALT: 10 U/L (ref 0–44)
ANION GAP: 8 (ref 5–15)
AST: 31 U/L (ref 15–41)
Albumin: 3.6 g/dL (ref 3.5–5.0)
Alkaline Phosphatase: 62 U/L (ref 38–126)
BUN: 13 mg/dL (ref 6–20)
CO2: 24 mmol/L (ref 22–32)
Calcium: 8.8 mg/dL — ABNORMAL LOW (ref 8.9–10.3)
Chloride: 106 mmol/L (ref 98–111)
Creatinine, Ser: 0.62 mg/dL (ref 0.44–1.00)
Glucose, Bld: 106 mg/dL — ABNORMAL HIGH (ref 70–99)
Potassium: 4.3 mmol/L (ref 3.5–5.1)
SODIUM: 138 mmol/L (ref 135–145)
Total Bilirubin: 1.2 mg/dL (ref 0.3–1.2)
Total Protein: 6.5 g/dL (ref 6.5–8.1)

## 2018-08-28 LAB — WET PREP, GENITAL
Sperm: NONE SEEN
Trich, Wet Prep: NONE SEEN

## 2018-08-28 LAB — POC URINE PREG, ED: Preg Test, Ur: NEGATIVE

## 2018-08-28 LAB — HCG, QUANTITATIVE, PREGNANCY: HCG, BETA CHAIN, QUANT, S: 1 m[IU]/mL (ref <5)

## 2018-08-28 NOTE — ED Triage Notes (Signed)
Pt found out she was pregnant about 6 weeks ago.  Had a week of bleeding off and on until the 28th of September.  Had a  miscarriage last month. Now having cramping but no bleeding. Hx of previous ectopic.

## 2018-08-28 NOTE — ED Provider Notes (Signed)
[facility] EMERGENCY DEPARTMENT Provider Note   CSN: [id number] Arrival date & time: 08/28/18  1912     History   Chief Complaint Chief Complaint  Patient presents with  . Abdominal Cramping    HPI Isabel Conner is a 22 y.o. female.  Patient presents to the emergency department with a chief complaint of abdominal cramping.  She reports that she has had an irregular periods this month.  She reports that she has had spotting since 9/25, and has had some lower abdominal/pelvic cramps.  She reports having had positive pregnancy test last month, but believes that she miscarried as she had vaginal bleeding after the pregnancy test last month.  She has not followed up with anyone regarding this.  She does not have an OB/GYN.  She reports that she has not had any vaginal discharge, but is still having some discomfort.  She has a history of ectopic pregnancy, and is concerned about this.  She rates her pain is mild to moderate.  Denies any other associated symptoms.  There are no aggravating factors.  The history is provided by the patient. No language interpreter was used.    Past Medical History:  Diagnosis Date  . Chronic back pain   . Obesity   . Obesity     Patient Active Problem List   Diagnosis Date Noted  . Obesity   . Chronic back pain     Past Surgical History:  Procedure Laterality Date  . ADENOIDECTOMY    . TONSILLECTOMY       OB History   None      Home Medications    Prior to Admission medications   Medication Sig Start Date End Date Taking? Authorizing Provider  bacitracin ointment Apply 1 application topically 2 (two) times daily. 05/22/17   Muthersbaugh, Dahlia Client, PA-C  ibuprofen (ADVIL,MOTRIN) 200 MG tablet Take 600-800 mg by mouth every 6 (six) hours as needed for headache or moderate pain.    [provider]  methocarbamol (ROBAXIN) 500 MG tablet Take 1 tablet (500 mg total) by mouth 2 (two) times daily. 11/17/15   Mady Gemma, PA-C  metroNIDAZOLE (FLAGYL) 500 MG tablet Take 1 tablet (500 mg total) by mouth 2 (two) times daily. 02/29/16   Barrett, Rolm Gala, PA-C  naproxen (NAPROSYN) 500 MG tablet Take 1 tablet (500 mg total) by mouth 2 (two) times daily with a meal. 11/17/15   Westfall, Dorise Hiss, PA-C  nitrofurantoin, macrocrystal-monohydrate, (MACROBID) 100 MG capsule Take 1 capsule (100 mg total) by mouth 2 (two) times daily. 02/29/16   Barrett, Rolm Gala, PA-C  Simethicone (GAS-X PO) Take 2 tablets by mouth daily as needed (gas).    [provider]    Family History History reviewed. No pertinent family history.  Social History Social History   Tobacco Use  . Smoking status: Never Smoker  . Smokeless tobacco: Never Used  Substance Use Topics  . Alcohol use: Yes    Alcohol/week: 3.0 standard drinks    Types: 3 Standard drinks or equivalent per week  . Drug use: Yes    Types: Marijuana    Comment: occasional     Allergies   Amoxicillin; Penicillins; and Aleve [naproxen sodium]   Review of Systems Review of Systems  All other systems reviewed and are negative.    Physical Exam Updated Vital Signs BP 121/74   Pulse 86   Temp 98.9 F (37.2 C) (Oral)   Resp 18   Ht 5\' 3"  (  1.6 m)   Wt 102.5 kg   LMP 08/26/2018   SpO2 100%   BMI 40.03 kg/m   Physical Exam  Constitutional: She is oriented to person, place, and time. She appears well-developed and well-nourished.  HENT:  Head: Normocephalic and atraumatic.  Eyes: Pupils are equal, round, and reactive to light. Conjunctivae and EOM are normal.  Neck: Normal range of motion. Neck supple.  Cardiovascular: Normal rate and regular rhythm. Exam reveals no gallop and no friction rub.  No murmur heard. Pulmonary/Chest: Effort normal and breath sounds normal. No respiratory distress. She has no wheezes. She has no rales. She exhibits no tenderness.  Abdominal: Soft. Bowel sounds are normal. She exhibits no distension and no mass.  There is no tenderness. There is no rebound and no guarding.  Genitourinary:  Genitourinary Comments: Pelvic exam chaperoned by female ER tech, no right or left adnexal tenderness, moderate uterine tenderness, no vaginal discharge, scant amount of blood in vaginal vault, no CMT or friability, no foreign body, no injury to the external genitalia, no other significant findings   Musculoskeletal: Normal range of motion. She exhibits no edema or tenderness.  Neurological: She is alert and oriented to person, place, and time.  Skin: Skin is warm and dry.  Psychiatric: She has a normal mood and affect. Her behavior is normal. Judgment and thought content normal.  Nursing note and vitals reviewed.    ED Treatments / Results  Labs (all labs ordered are listed, but only abnormal results are displayed) Labs Reviewed  WET PREP, GENITAL - Abnormal; Notable for the following components:      Result Value   Yeast Wet Prep HPF POC PRESENT (*)    Clue Cells Wet Prep HPF POC PRESENT (*)    WBC, Wet Prep HPF POC MANY (*)    All other components within normal limits  CBC WITH DIFFERENTIAL/PLATELET - Abnormal; Notable for the following components:   WBC 17.5 (*)    Neutro Abs 12.1 (*)    All other components within normal limits  COMPREHENSIVE METABOLIC PANEL - Abnormal; Notable for the following components:   Glucose, Bld 106 (*)    Calcium 8.8 (*)    All other components within normal limits  HCG, QUANTITATIVE, PREGNANCY  POC URINE PREG, ED  GC/CHLAMYDIA PROBE AMP ([place]) NOT AT [facility]    EKG None  Radiology US Transvaginal Non-ob  Result Date: 08/29/2018 CLINICAL DATA:  Pelvic pain x 3-4 days, miscarriage last month EXAM: TRANSABDOMINAL AND TRANSVAGINAL ULTRASOUND OF PELVIS DOPPLER ULTRASOUND OF OVARIES TECHNIQUE: Both transabdominal and transvaginal ultrasound examinations of the pelvis were performed. Transabdominal technique was performed for global imaging of the pelvis including  uterus, ovaries, adnexal regions, and pelvic cul-de-sac. It was necessary to proceed with endovaginal exam following the transabdominal exam to visualize the bilateral ovaries. Color and duplex Doppler ultrasound was utilized to evaluate blood flow to the ovaries. COMPARISON:  None. FINDINGS: Uterus Measurements: 6.7 x 3.3 x 4.3 cm. No fibroids or other mass visualized. Endometrium Thickness: 6 mm.  No focal abnormality visualized. Right ovary Measurements: 8.1 x 7.5 x 9.5 cm. 7.0 x 6.7 x 7.7 cm simple cyst. Adjacent 3.6 x 2.7 x 2.3 cm simple cyst. Intervening thickened septation versus ovarian tissue. Left ovary Measurements: 2.9 x 1.8 x 2.8 cm. Normal appearance/no adnexal mass. Pulsed Doppler evaluation of both ovaries demonstrates normal low-resistance arterial and venous waveforms. Other findings No abnormal free fluid. IMPRESSION: Two simple right ovarian cysts measuring up to 7.7  cm. Consider follow-up pelvic ultrasound in 6-12 weeks. Otherwise negative pelvic ultrasound. No evidence of ovarian torsion. Electronically Signed   By: Charline Bills M.D.   On: 08/29/2018 00:56   US Pelvis Complete  Result Date: 08/29/2018 CLINICAL DATA:  Pelvic pain x 3-4 days, miscarriage last month EXAM: TRANSABDOMINAL AND TRANSVAGINAL ULTRASOUND OF PELVIS DOPPLER ULTRASOUND OF OVARIES TECHNIQUE: Both transabdominal and transvaginal ultrasound examinations of the pelvis were performed. Transabdominal technique was performed for global imaging of the pelvis including uterus, ovaries, adnexal regions, and pelvic cul-de-sac. It was necessary to proceed with endovaginal exam following the transabdominal exam to visualize the bilateral ovaries. Color and duplex Doppler ultrasound was utilized to evaluate blood flow to the ovaries. COMPARISON:  None. FINDINGS: Uterus Measurements: 6.7 x 3.3 x 4.3 cm. No fibroids or other mass visualized. Endometrium Thickness: 6 mm.  No focal abnormality visualized. Right ovary Measurements:  8.1 x 7.5 x 9.5 cm. 7.0 x 6.7 x 7.7 cm simple cyst. Adjacent 3.6 x 2.7 x 2.3 cm simple cyst. Intervening thickened septation versus ovarian tissue. Left ovary Measurements: 2.9 x 1.8 x 2.8 cm. Normal appearance/no adnexal mass. Pulsed Doppler evaluation of both ovaries demonstrates normal low-resistance arterial and venous waveforms. Other findings No abnormal free fluid. IMPRESSION: Two simple right ovarian cysts measuring up to 7.7 cm. Consider follow-up pelvic ultrasound in 6-12 weeks. Otherwise negative pelvic ultrasound. No evidence of ovarian torsion. Electronically Signed   By: Charline Bills M.D.   On: 08/29/2018 00:56   [place] Art/ven Flow Abd Pelv Doppler  Result Date: 08/29/2018 CLINICAL DATA:  Pelvic pain x 3-4 days, miscarriage last month EXAM: TRANSABDOMINAL AND TRANSVAGINAL ULTRASOUND OF PELVIS DOPPLER ULTRASOUND OF OVARIES TECHNIQUE: Both transabdominal and transvaginal ultrasound examinations of the pelvis were performed. Transabdominal technique was performed for global imaging of the pelvis including uterus, ovaries, adnexal regions, and pelvic cul-de-sac. It was necessary to proceed with endovaginal exam following the transabdominal exam to visualize the bilateral ovaries. Color and duplex Doppler ultrasound was utilized to evaluate blood flow to the ovaries. COMPARISON:  None. FINDINGS: Uterus Measurements: 6.7 x 3.3 x 4.3 cm. No fibroids or other mass visualized. Endometrium Thickness: 6 mm.  No focal abnormality visualized. Right ovary Measurements: 8.1 x 7.5 x 9.5 cm. 7.0 x 6.7 x 7.7 cm simple cyst. Adjacent 3.6 x 2.7 x 2.3 cm simple cyst. Intervening thickened septation versus ovarian tissue. Left ovary Measurements: 2.9 x 1.8 x 2.8 cm. Normal appearance/no adnexal mass. Pulsed Doppler evaluation of both ovaries demonstrates normal low-resistance arterial and venous waveforms. Other findings No abnormal free fluid. IMPRESSION: Two simple right ovarian cysts measuring up to 7.7 cm.  Consider follow-up pelvic ultrasound in 6-12 weeks. Otherwise negative pelvic ultrasound. No evidence of ovarian torsion. Electronically Signed   By: Charline Bills M.D.   On: 08/29/2018 00:56    Procedures Procedures (including critical care time)  Medications Ordered in ED Medications - No data to display   Initial Impression / Assessment and Plan / ED Course  I have reviewed the triage vital signs and the nursing notes.  Pertinent labs & imaging results that were available during my care of the patient were reviewed by me and considered in my medical decision making (see chart for details).     Patient with lower abdominal cramps/pelvic pain.  Will check pelvic exam and probably ultrasound given history of recent pregnancy.  hCG is 1 today.  Likely had miscarriage last month.  Moderate leukocytosis to 17.5.  Will check wet  prep.  Wet prep shows yeast and clue cells.  We will treat appropriately with Flagyl and Diflucan.  Ultrasound shows 2 simple right ovarian cyst.  Recommend OB/GYN follow-up.  Patient advised of results.  She will follow-up as directed.  Return precautions given.  Final Clinical Impressions(s) / ED Diagnoses   Final diagnoses:  BV (bacterial vaginosis)  Yeast infection  Cyst of right ovary    ED Discharge Orders         Ordered    fluconazole (DIFLUCAN) 150 MG tablet   Once     08/29/18 0102    metroNIDAZOLE (FLAGYL) 500 MG tablet  2 times daily     08/29/18 0102           Roxy Horseman, PA-C 08/29/18 0104    Nira Conn, MD 08/29/18 [phone]

## 2018-08-28 NOTE — ED Notes (Signed)
Please note: Pt stated that in addition to abdominal cramping, she was experiencing photophobia and put sunglasses on to reduce the pain / headache she was experiencing.

## 2018-08-28 NOTE — ED Provider Notes (Signed)
Patient placed in Quick Look pathway, seen and evaluated   Chief Complaint: Abdominal cramping  HPI:  Isabel Conner is a 22 y.o. female who presents to the Emergency Department complaining of abdominal cramping x 4 days. She had spotting on day 1, then vaginal bleeding on day 2, but no bleeding the last 2 days. This is unusual for her menses, prompting her to seek care. She reports that she had a positive pregnancy test about 6 weeks ago. 4 weeks ago, she had a miscarriage, but never saw her doctor for follow up appointment.   ROS: + vaginal bleeding, abdominal pain  - vomiting, fevers  Physical Exam:   Gen: No distress  Neuro: Awake and Alert  Skin: Warm    Focused Exam: No abdominal tenderness.    Initiation of care has begun. The patient has been counseled on the process, plan, and necessity for staying for the completion/evaluation, and the remainder of the medical screening examination.    Cesar Rogerson, Chase Picket, PA-C 08/28/18 1945    Gwyneth Sprout, MD 08/29/18 [phone]

## 2018-08-28 NOTE — ED Notes (Signed)
Pt ambulatory to bathroom with no reported to issues.

## 2018-08-29 LAB — GC/CHLAMYDIA PROBE AMP (~~LOC~~) NOT AT ARMC
Chlamydia: NEGATIVE
Neisseria Gonorrhea: NEGATIVE

## 2018-08-29 MED ORDER — ACETAMINOPHEN 325 MG PO TABS: 325.0000 mg | ORAL_TABLET | Freq: Once | ORAL | Status: DC

## 2018-08-29 MED ORDER — ACETAMINOPHEN 325 MG PO TABS
650.0000 mg | ORAL_TABLET | Freq: Once | ORAL | Status: AC
  Administered 2018-08-29: 650 mg via ORAL
  Filled 2018-08-29: qty 2

## 2018-08-29 MED ORDER — METRONIDAZOLE 500 MG PO TABS: 500.0000 mg | ORAL_TABLET | Freq: Two times a day (BID) | ORAL | 0 refills | Status: DC

## 2018-08-29 MED ORDER — FLUCONAZOLE 150 MG PO TABS: 150.0000 mg | ORAL_TABLET | Freq: Once | ORAL | 0 refills | Status: AC

## 2018-08-29 NOTE — ED Notes (Signed)
Patient verbalizes understanding of discharge instructions. Opportunity for questioning and answers were provided. Armband removed by staff, pt discharged from ED home via POV.  

## 2019-05-03 ENCOUNTER — Encounter (HOSPITAL_COMMUNITY): Payer: Self-pay | Admitting: *Deleted

## 2019-05-03 ENCOUNTER — Other Ambulatory Visit: Payer: Self-pay

## 2019-05-03 ENCOUNTER — Inpatient Hospital Stay (HOSPITAL_COMMUNITY)
Admission: AD | Admit: 2019-05-03 | Discharge: 2019-05-03 | Disposition: A | Discharge status: Home / Self Care | Payer: BC Managed Care – PPO | Attending: Obstetrics | Admitting: Obstetrics

## 2019-05-03 ENCOUNTER — Inpatient Hospital Stay (HOSPITAL_COMMUNITY): Payer: BC Managed Care – PPO

## 2019-05-03 ENCOUNTER — Emergency Department (HOSPITAL_COMMUNITY)
Admission: EM | Admit: 2019-05-03 | Discharge: 2019-05-03 | Disposition: A | Discharge status: Home / Self Care | Payer: BC Managed Care – PPO

## 2019-05-03 DIAGNOSIS — Z880 Allergy status to penicillin: Secondary | ICD-10-CM | POA: Insufficient documentation

## 2019-05-03 DIAGNOSIS — R102 Pelvic and perineal pain: Secondary | ICD-10-CM | POA: Insufficient documentation

## 2019-05-03 DIAGNOSIS — O3481 Maternal care for other abnormalities of pelvic organs, first trimester: Secondary | ICD-10-CM | POA: Insufficient documentation

## 2019-05-03 DIAGNOSIS — R109 Unspecified abdominal pain: Secondary | ICD-10-CM

## 2019-05-03 DIAGNOSIS — Z3A01 Less than 8 weeks gestation of pregnancy: Secondary | ICD-10-CM | POA: Diagnosis not present

## 2019-05-03 DIAGNOSIS — O26899 Other specified pregnancy related conditions, unspecified trimester: Secondary | ICD-10-CM

## 2019-05-03 DIAGNOSIS — N83201 Unspecified ovarian cyst, right side: Secondary | ICD-10-CM | POA: Diagnosis not present

## 2019-05-03 DIAGNOSIS — O26891 Other specified pregnancy related conditions, first trimester: Secondary | ICD-10-CM | POA: Diagnosis not present

## 2019-05-03 DIAGNOSIS — Z6790 Unspecified blood type, Rh positive: Secondary | ICD-10-CM

## 2019-05-03 DIAGNOSIS — K5900 Constipation, unspecified: Secondary | ICD-10-CM | POA: Diagnosis not present

## 2019-05-03 HISTORY — DX: Anal fistula, unspecified: K60.30

## 2019-05-03 HISTORY — DX: Anal fistula: K60.3

## 2019-05-03 LAB — URINALYSIS, ROUTINE W REFLEX MICROSCOPIC
Bilirubin Urine: NEGATIVE
Glucose, UA: NEGATIVE mg/dL
Hgb urine dipstick: NEGATIVE
Ketones, ur: NEGATIVE mg/dL
Leukocytes,Ua: NEGATIVE
Nitrite: NEGATIVE
Protein, ur: NEGATIVE mg/dL
Specific Gravity, Urine: 1.011 (ref 1.005–1.030)
pH: 6 (ref 5.0–8.0)

## 2019-05-03 LAB — CBC
HCT: 38.9 % (ref 36.0–46.0)
Hemoglobin: 12.8 g/dL (ref 12.0–15.0)
MCH: 27.8 pg (ref 26.0–34.0)
MCHC: 32.9 g/dL (ref 30.0–36.0)
MCV: 84.6 fL (ref 80.0–100.0)
Platelets: 213 10*3/uL (ref 150–400)
RBC: 4.6 MIL/uL (ref 3.87–5.11)
RDW: 13.4 % (ref 11.5–15.5)
WBC: 14.3 10*3/uL — ABNORMAL HIGH (ref 4.0–10.5)
nRBC: 0 % (ref 0.0–0.2)

## 2019-05-03 LAB — POCT PREGNANCY, URINE: Preg Test, Ur: POSITIVE — AB

## 2019-05-03 LAB — WET PREP, GENITAL
Sperm: NONE SEEN
Trich, Wet Prep: NONE SEEN
Yeast Wet Prep HPF POC: NONE SEEN

## 2019-05-03 LAB — ABO/RH: ABO/RH(D): A POS

## 2019-05-03 LAB — HCG, QUANTITATIVE, PREGNANCY: hCG, Beta Chain, Quant, S: 26858 m[IU]/mL — ABNORMAL HIGH (ref <5)

## 2019-05-03 NOTE — Discharge Instructions (Signed)
Abdominal Pain During Pregnancy  Abdominal pain is common during pregnancy, and has many possible causes. Some causes are more serious than others, and sometimes the cause is not known. Abdominal pain can be a sign that labor is starting. It can also be caused by normal growth and stretching of muscles and ligaments during pregnancy. Always tell your health care provider if you have any abdominal pain. Follow these instructions at home:  Do not have sex or put anything in your vagina until your pain goes away completely.  Get plenty of rest until your pain improves.  Drink enough fluid to keep your urine pale yellow.  Take over-the-counter and prescription medicines only as told by your health care provider.  Keep all follow-up visits as told by your health care provider. This is important. Contact a health care provider if:  Your pain continues or gets worse after resting.  You have lower abdominal pain that: ? Comes and goes at regular intervals. ? Spreads to your back. ? Is similar to menstrual cramps.  You have pain or burning when you urinate. Get help right away if:  You have a fever or chills.  You have vaginal bleeding.  You are leaking fluid from your vagina.  You are passing tissue from your vagina.  You have vomiting or diarrhea that lasts for more than 24 hours.  Your baby is moving less than usual.  You feel very weak or faint.  You have shortness of breath.  You develop severe pain in your upper abdomen. Summary  Abdominal pain is common during pregnancy, and has many possible causes.  If you experience abdominal pain during pregnancy, tell your health care provider right away.  Follow your health care provider's home care instructions and keep all follow-up visits as directed. This information is not intended to replace advice given to you by your health care provider. Make sure you discuss any questions you have with your health care  provider. Document Released: 11/15/2005 Document Revised: 02/17/2017 Document Reviewed: 02/17/2017 Elsevier Interactive Patient Education  2019 [employer].   Constipation, Adult Constipation is when a person:  Poops (has a bowel movement) fewer times in a week than normal.  Has a hard time pooping.  Has poop that is dry, hard, or bigger than normal. Follow these instructions at home: Eating and drinking   Eat foods that have a lot of fiber, such as: ? Fresh fruits and vegetables. ? Whole grains. ? Beans.  Eat less of foods that are high in fat, low in fiber, or overly processed, such as: ? [place] fries. ? Hamburgers. ? Cookies. ? Candy. ? Soda.  Drink enough fluid to keep your pee (urine) clear or pale yellow. General instructions  Exercise regularly or as told by your doctor.  Go to the restroom when you feel like you need to poop. Do not hold it in.  Take over-the-counter and prescription medicines only as told by your doctor. These include any fiber supplements.  Do pelvic floor retraining exercises, such as: ? Doing deep breathing while relaxing your lower belly (abdomen). ? Relaxing your pelvic floor while pooping.  Watch your condition for any changes.  Keep all follow-up visits as told by your doctor. This is important. Contact a doctor if:  You have pain that gets worse.  You have a fever.  You have not pooped for 4 days.  You throw up (vomit).  You are not hungry.  You lose weight.  You are bleeding from the  anus.  You have thin, pencil-like poop (stool). Get help right away if:  You have a fever, and your symptoms suddenly get worse.  You leak poop or have blood in your poop.  Your belly feels hard or bigger than normal (is bloated).  You have very bad belly pain.  You feel dizzy or you faint. This information is not intended to replace advice given to you by your health care provider. Make sure you discuss any questions you have  with your health care provider. Document Released: 05/03/2008 Document Revised: 06/04/2016 Document Reviewed: 05/05/2016 Elsevier Interactive Patient Education  2019 [employer]

## 2019-05-03 NOTE — MAU Note (Signed)
Pt c/o sharp abd pain on and off for a few weeks. Had some bleeding last week and stated she passed some bloodclot/tissue for about 2 hours . No bleeding since.  Also stated she has been having belly button pain and noticed a green discharge from it. Not today though.

## 2019-05-03 NOTE — MAU Provider Note (Addendum)
History     CSN: [id number]  Arrival date and time: 05/03/19 1311   First Provider Initiated Contact with Patient 05/03/19 1405      Chief Complaint  Patient presents with  . Abdominal Pain   G3P0020  by sure LMP presenting with LAP. Pain started 2 weeks ago. Describes as sharp and intermittent, was initially bilateral but now mostly on left. Reports a 2 hr episode of heavy VB middle of last week. Denies urinary sx except increased urge with voiding and stooling. Denies vaginal discharge. She states she had to quit her job because of the pain. Reports hx of 2 ectopic pregnancies that her "body aborted in its own". Noticed rash, green drainage, and foul odor from her umbilicus last week.  OB History    Gravida  3   Para  0   Term      Preterm      AB  2   Living        SAB  1   TAB      Ectopic  1   Multiple      Live Births              Past Medical History:  Diagnosis Date  . Anal fistula   . Chronic back pain   . Obesity   . Obesity     Past Surgical History:  Procedure Laterality Date  . ADENOIDECTOMY    . TONSILLECTOMY      History reviewed. No pertinent family history.  Social History   Tobacco Use  . Smoking status: Never Smoker  . Smokeless tobacco: Never Used  Substance Use Topics  . Alcohol use: Not Currently    Alcohol/week: 3.0 standard drinks    Types: 3 Standard drinks or equivalent per week  . Drug use: Yes    Types: Marijuana    Comment: used yesterday    Allergies:  Allergies  Allergen Reactions  . Amoxicillin Anaphylaxis  . Penicillins Anaphylaxis    Has patient had a PCN reaction causing immediate rash, facial/tongue/throat swelling, SOB or lightheadedness with hypotension: Yes Has patient had a PCN reaction causing severe rash involving mucus membranes or skin necrosis: No Has patient had a PCN reaction that required hospitalization No Has patient had a PCN reaction occurring within the last 10 years: No If all  of the above answers are "NO", then may proceed with Cephalosporin use.   Armond Hang [Naproxen Sodium] Other (See Comments)    Chest pain    Medications Prior to Admission  Medication Sig Dispense Refill Last Dose  . bacitracin ointment Apply 1 application topically 2 (two) times daily. (Patient not taking: Reported on 08/28/2018) 120 g 0 Not Taking at Unknown time  . ibuprofen (ADVIL,MOTRIN) 200 MG tablet Take 600-800 mg by mouth every 6 (six) hours as needed for headache or moderate pain.   Past Month at Unknown time  . methocarbamol (ROBAXIN) 500 MG tablet Take 1 tablet (500 mg total) by mouth 2 (two) times daily. (Patient not taking: Reported on 08/28/2018) 20 tablet 0 Not Taking at Unknown time  . metroNIDAZOLE (FLAGYL) 500 MG tablet Take 1 tablet (500 mg total) by mouth 2 (two) times daily. 14 tablet 0   . naproxen (NAPROSYN) 500 MG tablet Take 1 tablet (500 mg total) by mouth 2 (two) times daily with a meal. (Patient not taking: Reported on 08/28/2018) 30 tablet 0 Not Taking at Unknown time  . nitrofurantoin, macrocrystal-monohydrate, (MACROBID) 100 MG capsule Take  1 capsule (100 mg total) by mouth 2 (two) times daily. (Patient not taking: Reported on 08/28/2018) 10 capsule 0 Not Taking at Unknown time    Review of Systems  Constitutional: Negative for chills and fever.  Gastrointestinal: Positive for abdominal pain and constipation. Negative for diarrhea, nausea and vomiting.  Genitourinary: Positive for vaginal bleeding. Negative for dysuria, hematuria, urgency and vaginal discharge.   Physical Exam   Blood pressure (!) 117/53, pulse 96, temperature 98.2 F (36.8 C), temperature source Oral, resp. rate 18, height  (1.6 m), weight 105.2 kg, last menstrual period 03/12/2019.  Physical Exam  Nursing note and vitals reviewed. Constitutional: She is oriented to person, place, and time. She appears well-developed and well-nourished. No distress.  HENT:  Head: Normocephalic and  atraumatic.  Neck: Normal range of motion.  Cardiovascular: Normal rate.  Respiratory: Effort normal. No respiratory distress.  GI: Soft. She exhibits no distension and no mass. There is no abdominal tenderness. There is no rebound and no guarding.    Genitourinary:    Genitourinary Comments: External: no lesions or erythema Vagina: rugated, pink, moist, small thin white discharge Uterus: non enlarged, anteverted, non tender, no CMT Adnexae: no masses, no tenderness left, no tenderness right Cervix closed    Musculoskeletal: Normal range of motion.  Neurological: She is alert and oriented to person, place, and time.  Skin: Skin is warm and dry.  Psychiatric: She has a normal mood and affect.   Results for orders placed or performed during the hospital encounter of 05/03/19 (from the past 24 hour(s))  Urinalysis, Routine w reflex microscopic     Status: Abnormal   Collection Time: 05/03/19  1:48 PM  Result Value Ref Range   Color, Urine YELLOW YELLOW   APPearance HAZY (A) CLEAR   Specific Gravity, Urine 1.011 1.005 - 1.030   pH 6.0 5.0 - 8.0   Glucose, UA NEGATIVE NEGATIVE mg/dL   Hgb urine dipstick NEGATIVE NEGATIVE   Bilirubin Urine NEGATIVE NEGATIVE   Ketones, ur NEGATIVE NEGATIVE mg/dL   Protein, ur NEGATIVE NEGATIVE mg/dL   Nitrite NEGATIVE NEGATIVE   Leukocytes,Ua NEGATIVE NEGATIVE  Pregnancy, urine POC     Status: Abnormal   Collection Time: 05/03/19  2:06 PM  Result Value Ref Range   Preg Test, Ur POSITIVE (A) NEGATIVE  Wet prep, genital     Status: Abnormal   Collection Time: 05/03/19  2:16 PM  Result Value Ref Range   Yeast Wet Prep HPF POC NONE SEEN NONE SEEN   Trich, Wet Prep NONE SEEN NONE SEEN   Clue Cells Wet Prep HPF POC PRESENT (A) NONE SEEN   WBC, Wet Prep HPF POC MANY (A) NONE SEEN   Sperm NONE SEEN   CBC     Status: Abnormal   Collection Time: 05/03/19  2:31 PM  Result Value Ref Range   WBC 14.3 (H) 4.0 - 10.5 K/uL   RBC 4.60 3.87 - 5.11 MIL/uL    Hemoglobin 12.8 12.0 - 15.0 g/dL   HCT 16.1 09.6 - 04.5 %   MCV 84.6 80.0 - 100.0 fL   MCH 27.8 26.0 - 34.0 pg   MCHC 32.9 30.0 - 36.0 g/dL   RDW 40.9 81.1 - 91.4 %   Platelets 213 150 - 400 K/uL   nRBC 0.0 0.0 - 0.2 %  ABO/Rh     Status: None   Collection Time: 05/03/19  2:31 PM  Result Value Ref Range   ABO/RH(D) A POS  No rh immune globuloin      NOT A RH IMMUNE GLOBULIN CANDIDATE, PT RH POSITIVE Performed at [facility], [address] [place], [place] [address]   hCG, quantitative, pregnancy     Status: Abnormal   Collection Time: 05/03/19  2:31 PM  Result Value Ref Range   hCG, Beta Chain, Quant, S 26,858 (H) <5 mIU/mL   [place] Ob Less Than 14 Weeks With Ob Transvaginal  Result Date: 05/03/2019 CLINICAL DATA:  Pregnant patient with pelvic pain for 2 weeks. EXAM: OBSTETRIC <14 WK US AND TRANSVAGINAL OB US TECHNIQUE: Both transabdominal and transvaginal ultrasound examinations were performed for complete evaluation of the gestation as well as the maternal uterus, adnexal regions, and pelvic cul-de-sac. Transvaginal technique was performed to assess early pregnancy. COMPARISON:  Pelvic ultrasound 08/28/2018. FINDINGS: Intrauterine gestational sac: Single. Yolk sac:  Visualized. Embryo:  Visualized. Cardiac Activity: Detected. Heart Rate: 152 bpm CRL:  9.7 mm   7 w   0 d                  US EDC: 12/20/2019 Subchorionic hemorrhage:  None visualized. Maternal uterus/adnexae: The right ovary measures 9.1 x 6.1 x 10.2 cm. A simple cyst measuring 7.2 x 6.3 x 7.6 cm is seen in the right ovary. There is also an adjacent cyst measuring 4.2 x 3.6 x 3.2 cm. These could represent 1 large cyst with a single thin septation between them. The left ovary is unremarkable. IMPRESSION: Single living intrauterine pregnancy.  No acute finding. Single large right ovarian cyst with a single thin septation between them or two adjacent ovarian cysts, unchanged since the comparison exam. Recommend follow-up  ultrasound in 1 year. Electronically Signed   By: Drusilla Kannerhomas  Dalessio M.D.   On: 05/03/2019 15:48   MAU Course  Procedures Orders Placed This Encounter  Procedures  . Wet prep, genital    Standing Status:   Standing    Number of Occurrences:   1    Order Specific Question:   Patient immune status    Answer:   Normal  . US OB LESS THAN 14 WEEKS WITH OB TRANSVAGINAL    Standing Status:   Standing    Number of Occurrences:   1    Order Specific Question:   Symptom/Reason for Exam    Answer:   Abdominal pain in pregnancy [[id number]]  . Urinalysis, Routine w reflex microscopic    Standing Status:   Standing    Number of Occurrences:   1  . CBC    Standing Status:   Standing    Number of Occurrences:   1  . hCG, quantitative, pregnancy    Standing Status:   Standing    Number of Occurrences:   1  . Pregnancy, urine POC    Standing Status:   Standing    Number of Occurrences:   1  . ABO/Rh    Standing Status:   Standing    Number of Occurrences:   1  . Discharge patient    Order Specific Question:   Discharge disposition    Answer:   01-Home or Self Care [1]    Order Specific Question:   Discharge patient date    Answer:   05/03/2019   MDM Labs and US ordered and reviewed. Viable IUP on US and right ovarian cyst. Unclear source of pain since in LLQ could be constipation, discussed prevention of. Stable for discharge home.   Assessment and Plan  1. [redacted] weeks gestation of pregnancy   2. Abdominal pain in pregnancy   3. Blood type, Rh positive   4. Right ovarian cyst    Discharge home Follow up at [facility] next week as scheduled SAB precautions  Allergies as of 05/03/2019      Reactions   Amoxicillin Anaphylaxis   Penicillins Anaphylaxis   Has patient had a PCN reaction causing immediate rash, facial/tongue/throat swelling, SOB or lightheadedness with hypotension: Yes Has patient had a PCN reaction causing severe rash involving mucus membranes or skin necrosis: No Has  patient had a PCN reaction that required hospitalization No Has patient had a PCN reaction occurring within the last 10 years: No If all of the above answers are "NO", then may proceed with Cephalosporin use.   Aleve [naproxen Sodium] Other (See Comments)   Chest pain      Medication List    STOP taking these medications   bacitracin ointment   ibuprofen 200 MG tablet Commonly known as:  ADVIL   methocarbamol 500 MG tablet Commonly known as:  ROBAXIN   metroNIDAZOLE 500 MG tablet Commonly known as:  FLAGYL   naproxen 500 MG tablet Commonly known as:  Naprosyn   nitrofurantoin (macrocrystal-monohydrate) 100 MG capsule Commonly known as:  Judeen Hammans, CNM 05/03/2019, 4:13 PM

## 2019-05-04 ENCOUNTER — Encounter (HOSPITAL_COMMUNITY): Payer: Self-pay

## 2019-05-04 LAB — GC/CHLAMYDIA PROBE AMP (~~LOC~~) NOT AT ARMC
Chlamydia: NEGATIVE
Neisseria Gonorrhea: NEGATIVE

## 2019-08-13 ENCOUNTER — Other Ambulatory Visit: Payer: Self-pay

## 2019-08-13 DIAGNOSIS — Z20822 Contact with and (suspected) exposure to covid-19: Secondary | ICD-10-CM

## 2019-08-14 LAB — NOVEL CORONAVIRUS, NAA: SARS-CoV-2, NAA: NOT DETECTED

## 2019-12-17 ENCOUNTER — Encounter (HOSPITAL_COMMUNITY): Payer: Self-pay

## 2019-12-17 ENCOUNTER — Other Ambulatory Visit: Payer: Self-pay

## 2019-12-17 ENCOUNTER — Ambulatory Visit (HOSPITAL_COMMUNITY)
Admission: EM | Admit: 2019-12-17 | Discharge: 2019-12-17 | Disposition: A | Discharge status: Home / Self Care | Payer: BC Managed Care – PPO | Attending: Family Medicine | Admitting: Family Medicine

## 2019-12-17 DIAGNOSIS — M5442 Lumbago with sciatica, left side: Secondary | ICD-10-CM | POA: Diagnosis not present

## 2019-12-17 MED ORDER — CYCLOBENZAPRINE HCL 5 MG PO TABS: 5.0000 mg | ORAL_TABLET | Freq: Every day | ORAL | 0 refills | Status: DC

## 2019-12-17 MED ORDER — MELOXICAM 7.5 MG PO TABS: 7.5000 mg | ORAL_TABLET | Freq: Every day | ORAL | 0 refills | Status: DC

## 2019-12-17 MED ORDER — PREDNISONE 10 MG (21) PO TBPK: ORAL_TABLET | Freq: Every day | ORAL | 0 refills | Status: DC

## 2019-12-17 NOTE — ED Triage Notes (Signed)
Pt present back and leg pain, this been going on for over a month now.

## 2019-12-17 NOTE — ED Provider Notes (Signed)
[facility]    CSN: [id number] Arrival date & time: 12/17/19  1245      History   Chief Complaint Chief Complaint  Patient presents with  . Back Pain  . Leg Pain    HPI Isabel Conner is a 24 y.o. female.   Isabel Conner presents with complaints of low back pain with radiation to left leg. Has had issues for over 10 years, intermittently, for her back. Most recent occurrence for the past 1.5 months. No specific known injury. 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 Certain movements and walking worsens the pain. Laying flat worsens the pain. Has done physical therapy in the past, last in 2012. Has been taking tylenol extra strength which hasn't helped. Hasn't taken ibuprofen in two weeks as it was unhelpful. No numbness tingling or weakness, no saddle symptoms. No loss of bladder or bowel. 7/10 pain. States she is has had issues with being overweight for years and is interested in getting assistance with weight loss.     ROS per HPI, negative if not otherwise mentioned.      Past Medical History:  Diagnosis Date  . Anal fistula   . Chronic back pain   . Obesity   . Obesity     Patient Active Problem List   Diagnosis Date Noted  . Obesity   . Chronic back pain     Past Surgical History:  Procedure Laterality Date  . ADENOIDECTOMY    . TONSILLECTOMY      OB History    Gravida  3   Para  0   Term      Preterm      AB  2   Living        SAB  1   TAB      Ectopic  1   Multiple      Live Births               Home Medications    Prior to Admission medications   Medication Sig Start Date End Date Taking? Authorizing Provider  cyclobenzaprine (FLEXERIL) 5 MG tablet Take 1 tablet (5 mg total) by mouth at bedtime. 12/17/19   Georgetta Haber, NP  meloxicam (MOBIC) 7.5 MG tablet Take 1 tablet (7.5 mg total) by mouth daily. 12/17/19   Georgetta Haber, NP  predniSONE (STERAPRED UNI-PAK 21 TAB) 10 MG (21) TBPK tablet Take by  mouth daily. Per box instruction 12/17/19   Georgetta Haber, NP    Family History No family history on file.  Social History Social History   Tobacco Use  . Smoking status: Never Smoker  . Smokeless tobacco: Never Used  Substance Use Topics  . Alcohol use: Not Currently    Alcohol/week: 3.0 standard drinks    Types: 3 Standard drinks or equivalent per week  . Drug use: Yes    Types: Marijuana    Comment: used yesterday     Allergies   Amoxicillin, Penicillins, and Aleve [naproxen sodium]   Review of Systems Review of Systems   Physical Exam Triage Vital Signs ED Triage Vitals [12/17/19 1356]  Enc Vitals Group     BP 120/63     Pulse Rate 95     Resp 16     Temp 98.4 F (36.9 C)     Temp Source Oral     SpO2 100 %     Weight      Height  Head Circumference      Peak Flow      Pain Score 10     Pain Loc      Pain Edu?      Excl. in GC?    No data found.  Updated Vital Signs BP 120/63 (BP Location: Right Arm)   Pulse 95   Temp 98.4 F (36.9 C) (Oral)   Resp 16   LMP 03/12/2019   SpO2 100%   Breastfeeding Unknown    Physical Exam Constitutional:      General: She is not in acute distress.    Appearance: She is well-developed.  Cardiovascular:     Rate and Rhythm: Normal rate.  Pulmonary:     Effort: Pulmonary effort is normal.  Musculoskeletal:     Lumbar back: Tenderness present. No bony tenderness. Normal range of motion. Positive left straight leg raise test. Negative right straight leg raise test.     Comments: Bilateral low back pain, with tenderness to left low back musculature; no step off or deformity to spinous processes; ambulatory without difficulty; strength equal bilaterally; gross sensation intact to lower extremities   Skin:    General: Skin is warm and dry.  Neurological:     Mental Status: She is alert and oriented to person, place, and time.      UC Treatments / Results  Labs (all labs ordered are listed, but only  abnormal results are displayed) Labs Reviewed - No data to display  EKG   Radiology No results found.  Procedures Procedures (including critical care time)  Medications Ordered in UC Medications - No data to display  Initial Impression / Assessment and Plan / UC Course  I have reviewed the triage vital signs and the nursing notes.  Pertinent labs & imaging results that were available during my care of the patient were reviewed by me and considered in my medical decision making (see chart for details).     Low back, L>R, has been intermittent for years with flare over the past 1.5 month. No new injury. No red flag findings. Sciatica on exam with treatment provided. Encouraged follow up for long term management. Weight management resources provided as well per patient request. Patient verbalized understanding and agreeable to plan.  Ambulatory out of clinic without difficulty.   Final Clinical Impressions(s) / UC Diagnoses   Final diagnoses:  Bilateral low back pain with left-sided sciatica, unspecified chronicity     Discharge Instructions     Light and regular activity as tolerated.  See exercises provided.  Heat application while active can help with muscle spasms.  Sleep with pillow under your knees.   Complete course of steroids Flexeril at night as a muscle relaxer. May cause drowsiness. Please do not take if driving or drinking alcohol.   Once steroids are completed you may use meloxicam once a day. Take with food and don't take additional ibuprofen.  I have provided you with two resources for options for follow up- physical medicine and rehab as well as our [facility].     ED Prescriptions    Medication Sig Dispense Auth. Provider   predniSONE (STERAPRED UNI-PAK 21 TAB) 10 MG (21) TBPK tablet Take by mouth daily. Per box instruction 21 tablet Linus Mako B, NP   cyclobenzaprine (FLEXERIL) 5 MG tablet Take 1 tablet (5 mg total) by mouth at  bedtime. 15 tablet Linus Mako B, NP   meloxicam (MOBIC) 7.5 MG tablet Take 1 tablet (7.5 mg  total) by mouth daily. 20 tablet Zigmund Gottron, NP     PDMP not reviewed this encounter.   Zigmund Gottron, NP 12/17/19 1520

## 2019-12-17 NOTE — Discharge Instructions (Addendum)
Light and regular activity as tolerated.  See exercises provided.  Heat application while active can help with muscle spasms.  Sleep with pillow under your knees.   Complete course of steroids Flexeril at night as a muscle relaxer. May cause drowsiness. Please do not take if driving or drinking alcohol.   Once steroids are completed you may use meloxicam once a day. Take with food and don't take additional ibuprofen.  I have provided you with two resources for options for follow up- physical medicine and rehab as well as our [facility].

## 2021-02-04 ENCOUNTER — Ambulatory Visit (INDEPENDENT_AMBULATORY_CARE_PROVIDER_SITE_OTHER): Payer: Self-pay | Admitting: *Deleted

## 2021-02-04 ENCOUNTER — Encounter: Payer: Self-pay | Admitting: Adult Health

## 2021-02-04 ENCOUNTER — Other Ambulatory Visit: Payer: Self-pay

## 2021-02-04 DIAGNOSIS — Z3201 Encounter for pregnancy test, result positive: Secondary | ICD-10-CM

## 2021-02-04 LAB — POCT URINE PREGNANCY: Preg Test, Ur: POSITIVE — AB

## 2021-02-04 NOTE — Progress Notes (Signed)
Chart reviewed for nurse visit. Agree with plan of care.  Izaiah Tabb A, NP 02/04/2021 1:48 PM  

## 2021-02-04 NOTE — Progress Notes (Signed)
   NURSE VISIT- PREGNANCY CONFIRMATION   SUBJECTIVE:  Isabel Conner is a 25 y.o. G33P0020 female at [redacted]w[redacted]d by certain LMP of Patient's last menstrual period was 12/24/2020 (exact date). Here for pregnancy confirmation.  Home pregnancy test: positive x 1  She reports cramping.  She is taking prenatal vitamins.    OBJECTIVE:  LMP 12/24/2020 (Exact Date)   Breastfeeding No   Appears well, in no apparent distress OB History  Gravida Para Term Preterm AB Living  4 0     2    SAB IAB Ectopic Multiple Live Births  1   1        # Outcome Date GA Lbr Len/2nd Weight Sex Delivery Anes PTL Lv  4 Current           3 SAB 2016          2 Ectopic           1 Gravida             Results for orders placed or performed in visit on 02/04/21 (from the past 24 hour(s))  POCT urine pregnancy   Collection Time: 02/04/21  9:53 AM  Result Value Ref Range   Preg Test, Ur Positive (A) Negative    ASSESSMENT: Positive pregnancy test, [redacted]w[redacted]d by LMP    PLAN: Schedule for dating ultrasound in 2 weeks Prenatal vitamins: continue   Nausea medicines: not currently needed   OB packet given: Yes  Annamarie Dawley  02/04/2021 9:54 AM

## 2021-02-07 ENCOUNTER — Inpatient Hospital Stay (HOSPITAL_COMMUNITY): Payer: Medicaid Other

## 2021-02-07 ENCOUNTER — Inpatient Hospital Stay (HOSPITAL_COMMUNITY)
Admission: AD | Admit: 2021-02-07 | Discharge: 2021-02-07 | Disposition: A | Discharge status: Home / Self Care | Payer: Medicaid Other | Attending: Emergency Medicine | Admitting: Emergency Medicine

## 2021-02-07 ENCOUNTER — Other Ambulatory Visit: Payer: Self-pay

## 2021-02-07 ENCOUNTER — Encounter (HOSPITAL_COMMUNITY): Payer: Self-pay | Admitting: Emergency Medicine

## 2021-02-07 DIAGNOSIS — Z3490 Encounter for supervision of normal pregnancy, unspecified, unspecified trimester: Secondary | ICD-10-CM

## 2021-02-07 DIAGNOSIS — N8311 Corpus luteum cyst of right ovary: Secondary | ICD-10-CM | POA: Diagnosis not present

## 2021-02-07 DIAGNOSIS — Z3A01 Less than 8 weeks gestation of pregnancy: Secondary | ICD-10-CM | POA: Insufficient documentation

## 2021-02-07 DIAGNOSIS — O26891 Other specified pregnancy related conditions, first trimester: Secondary | ICD-10-CM

## 2021-02-07 DIAGNOSIS — O26899 Other specified pregnancy related conditions, unspecified trimester: Secondary | ICD-10-CM

## 2021-02-07 DIAGNOSIS — O0001 Abdominal pregnancy with intrauterine pregnancy: Secondary | ICD-10-CM | POA: Diagnosis not present

## 2021-02-07 DIAGNOSIS — R102 Pelvic and perineal pain: Secondary | ICD-10-CM

## 2021-02-07 DIAGNOSIS — O3481 Maternal care for other abnormalities of pelvic organs, first trimester: Secondary | ICD-10-CM | POA: Insufficient documentation

## 2021-02-07 LAB — URINALYSIS, ROUTINE W REFLEX MICROSCOPIC
Bilirubin Urine: NEGATIVE
Glucose, UA: NEGATIVE mg/dL
Hgb urine dipstick: NEGATIVE
Ketones, ur: NEGATIVE mg/dL
Leukocytes,Ua: NEGATIVE
Nitrite: NEGATIVE
Protein, ur: NEGATIVE mg/dL
Specific Gravity, Urine: 1.013 (ref 1.005–1.030)
pH: 6 (ref 5.0–8.0)

## 2021-02-07 LAB — WET PREP, GENITAL
Sperm: NONE SEEN
Trich, Wet Prep: NONE SEEN
Yeast Wet Prep HPF POC: NONE SEEN

## 2021-02-07 LAB — CBC
HCT: 38.7 % (ref 36.0–46.0)
Hemoglobin: 13.1 g/dL (ref 12.0–15.0)
MCH: 29.2 pg (ref 26.0–34.0)
MCHC: 33.9 g/dL (ref 30.0–36.0)
MCV: 86.4 fL (ref 80.0–100.0)
Platelets: 236 10*3/uL (ref 150–400)
RBC: 4.48 MIL/uL (ref 3.87–5.11)
RDW: 13.2 % (ref 11.5–15.5)
WBC: 17.3 10*3/uL — ABNORMAL HIGH (ref 4.0–10.5)
nRBC: 0 % (ref 0.0–0.2)

## 2021-02-07 LAB — HCG, QUANTITATIVE, PREGNANCY: hCG, Beta Chain, Quant, S: 17253 m[IU]/mL — ABNORMAL HIGH (ref <5)

## 2021-02-07 NOTE — ED Provider Notes (Signed)
Emergency Medicine Provider OB Triage Evaluation Note  Isabel Conner is a 25 y.o. female, G4P0020, at [redacted]w[redacted]d gestation who presents to the emergency department with complaints of RLQ abdominal pain that began earlier today. PT reports history of 2 ectopic pregnancies in the past and states this feels similar causing her concern. No vaginal bleeding. No other complaints.  Review of  Systems  Positive: + abdominal pain Negative: - fevers, - nausea, - vomiting, - urinary sx  Physical Exam  BP (!) 145/75   Pulse 88   Temp 98.6 F (37 C)   Resp 18   LMP 12/24/2020 (Exact Date)   SpO2 100%  General: Awake, no distress  HEENT: Atraumatic  Resp: Normal effort  Cardiac: Normal rate Abd: Nondistended, nontender  MSK: Moves all extremities without difficulty Neuro: Speech clear  Medical Decision Making  Pt evaluated for pregnancy concern and is stable for transfer to MAU. Pt is in agreement with plan for transfer.  6:41 Discussed with MAU APP, Rayfield Citizen, who accepts patient in transfer.  Clinical Impression  No diagnosis found.  25 year old female who is currently [redacted] weeks pregnant presenting to the ED with right lower quadrant abdominal pain that began earlier today, history of ectopic pregnancies and states this feels similar.  On arrival to the ED vitals are stable.  Patient appears to be no acute distress.  We do have a documented pregnancy test in our system from 3/02.  Have discussed case with MAU who will accept patient for transfer.    Tanda Rockers, PA-C 02/07/21 1904    Tilden Fossa, MD 02/07/21 2043

## 2021-02-07 NOTE — MAU Note (Signed)
Sent from [facility] with c/o RLQ pain that started earlier today. Denies any vag bleeding or discharge. Stated she has a feeling of fullness in that RLQ.

## 2021-02-07 NOTE — ED Notes (Addendum)
[place], [place] at triage for MSE.  Report called to [facility] in MAU and transport notified of pt.

## 2021-02-07 NOTE — Discharge Instructions (Signed)
Obstetrics: Normal and Problem Pregnancies (7th ed., pp. 102-121). [place], [place]: Elsevier."> Textbook of Family Medicine (9th ed., pp. 365-410). [place], [place]: [employer].">  First Trimester of Pregnancy  The first trimester of pregnancy starts on the first day of your last menstrual period until the end of week 12. This is months 1 through 3 of pregnancy. A week after a sperm fertilizes an egg, the egg will implant into the wall of the uterus and begin to develop into a baby. By the end of 12 weeks, all the baby's organs will be formed and the baby will be 2-3 inches in size. Body changes during your first trimester Your body goes through many changes during pregnancy. The changes vary and generally return to normal after your baby is born. Physical changes  You may gain or lose weight.  Your breasts may begin to grow larger and become tender. The tissue that surrounds your nipples (areola) may become darker.  Dark spots or blotches (chloasma or mask of pregnancy) may develop on your face.  You may have changes in your hair. These can include thickening or thinning of your hair or changes in texture. Health changes  You may feel nauseous, and you may vomit.  You may have heartburn.  You may develop headaches.  You may develop constipation.  Your gums may bleed and may be sensitive to brushing and flossing. Other changes  You may tire easily.  You may urinate more often.  Your menstrual periods will stop.  You may have a loss of appetite.  You may develop cravings for certain kinds of food.  You may have changes in your emotions from day to day.  You may have more vivid and strange dreams. Follow these instructions at home: Medicines  Follow your health care provider's instructions regarding medicine use. Specific medicines may be either safe or unsafe to take during pregnancy. Do not take any medicines unless told to by your health care provider.  Take a  prenatal vitamin that contains at least 600 micrograms (mcg) of folic acid. Eating and drinking  Eat a healthy diet that includes fresh fruits and vegetables, whole grains, good sources of protein such as meat, eggs, or tofu, and low-fat dairy products.  Avoid raw meat and unpasteurized juice, milk, and cheese. These carry germs that can harm you and your baby.  If you feel nauseous or you vomit: ? Eat 4 or 5 small meals a day instead of 3 large meals. ? Try eating a few soda crackers. ? Drink liquids between meals instead of during meals.  You may need to take these actions to prevent or treat constipation: ? Drink enough fluid to keep your urine pale yellow. ? Eat foods that are high in fiber, such as beans, whole grains, and fresh fruits and vegetables. ? Limit foods that are high in fat and processed sugars, such as fried or sweet foods. Activity  Exercise only as directed by your health care provider. Most people can continue their usual exercise routine during pregnancy. Try to exercise for 30 minutes at least 5 days a week.  Stop exercising if you develop pain or cramping in the lower abdomen or lower back.  Avoid exercising if it is very hot or humid or if you are at high altitude.  Avoid heavy lifting.  If you choose to, you may have sex unless your health care provider tells you not to. Relieving pain and discomfort  Wear a good support bra to relieve breast   tenderness.  Rest with your legs elevated if you have leg cramps or low back pain.  If you develop bulging veins (varicose veins) in your legs: ? Wear support hose as told by your health care provider. ? Elevate your feet for 15 minutes, 3-4 times a day. ? Limit salt in your diet. Safety  Wear your seat belt at all times when driving or riding in a car.  Talk with your health care provider if someone is verbally or physically abusive to you.  Talk with your health care provider if you are feeling sad or have  thoughts of hurting yourself. Lifestyle  Do not use hot tubs, steam rooms, or saunas.  Do not douche. Do not use tampons or scented sanitary pads.  Do not use herbal remedies, alcohol, illegal drugs, or medicines that are not approved by your health care provider. Chemicals in these products can harm your baby.  Do not use any products that contain nicotine or tobacco, such as cigarettes, e-cigarettes, and chewing tobacco. If you need help quitting, ask your health care provider.  Avoid cat litter boxes and soil used by cats. These carry germs that can cause birth defects in the baby and possibly loss of the unborn baby (fetus) by miscarriage or stillbirth. General instructions  During routine prenatal visits in the first trimester, your health care provider will do a physical exam, perform necessary tests, and ask you how things are going. Keep all follow-up visits. This is important.  Ask for help if you have counseling or nutritional needs during pregnancy. Your health care provider can offer advice or refer you to specialists for help with various needs.  Schedule a dentist appointment. At home, brush your teeth with a soft toothbrush. Floss gently.  Write down your questions. Take them to your prenatal visits. Where to find more information  American Pregnancy Association: americanpregnancy.org  American College of Obstetricians and Gynecologists: acog.org/en/Womens%20Health/Pregnancy  Office on Women's Health: womenshealth.gov/pregnancy Contact a health care provider if you have:  Dizziness.  A fever.  Mild pelvic cramps, pelvic pressure, or nagging pain in the abdominal area.  Nausea, vomiting, or diarrhea that lasts for 24 hours or longer.  A bad-smelling vaginal discharge.  Pain when you urinate.  Known exposure to a contagious illness, such as chickenpox, measles, Zika virus, HIV, or hepatitis. Get help right away if you have:  Spotting or bleeding from your  vagina.  Severe abdominal cramping or pain.  Shortness of breath or chest pain.  Any kind of trauma, such as from a fall or a car crash.  New or increased pain, swelling, or redness in an arm or leg. Summary  The first trimester of pregnancy starts on the first day of your last menstrual period until the end of week 12 (months 1 through 3).  Eating 4 or 5 small meals a day rather than 3 large meals may help to relieve nausea and vomiting.  Do not use any products that contain nicotine or tobacco, such as cigarettes, e-cigarettes, and chewing tobacco. If you need help quitting, ask your health care provider.  Keep all follow-up visits. This is important. This information is not intended to replace advice given to you by your health care provider. Make sure you discuss any questions you have with your health care provider. Document Revised: 04/23/2020 Document Reviewed: 02/28/2020 Elsevier Patient Education  2021 Elsevier Inc.  

## 2021-02-07 NOTE — ED Triage Notes (Signed)
Pt states she is approx [redacted] weeks pregnant.  Reports abd cramping today that she relates to being on her feet at work.  Denies vaginal bleeding.

## 2021-02-07 NOTE — MAU Provider Note (Signed)
History     CSN: [id number]  Arrival date and time: 02/07/21 8341   Event Date/Time   First Provider Initiated Contact with Patient 02/07/21 1937      Chief Complaint  Patient presents with  . Abdominal Pain   Isabel Conner is a 25 y.o. [id number] at [redacted]w[redacted]d who presents today with cramping and pain mostly in her RLQ. She reports that this started today. She denies any bleeding. She has had 2 prior ectopic pregnancies.   Pelvic Pain The patient's primary symptoms include pelvic pain. This is a new problem. The current episode started yesterday. The problem occurs intermittently. The problem has been unchanged. The problem affects the right side. She is pregnant. Associated symptoms include frequency. Nothing aggravates the symptoms. She has tried nothing for the symptoms. Her menstrual history has been regular (LMP 12/24/2020).    OB History    Gravida  5   Para  0   Term      Preterm      AB  4   Living  0     SAB  0   IAB  2   Ectopic  2   Multiple      Live Births              Past Medical History:  Diagnosis Date  . Anal fistula   . Chronic back pain   . Obesity   . Obesity     Past Surgical History:  Procedure Laterality Date  . ADENOIDECTOMY    . TONSILLECTOMY      Family History  Problem Relation Age of Onset  . Diabetes Mother   . Hypertension Mother   . Hypertension Father     Social History   Tobacco Use  . Smoking status: Never Smoker  . Smokeless tobacco: Never Used  Vaping Use  . Vaping Use: Never used  Substance Use Topics  . Alcohol use: Not Currently    Alcohol/week: 3.0 standard drinks    Types: 3 Standard drinks or equivalent per week  . Drug use: Yes    Types: Marijuana    Comment: quit in December 2021    Allergies:  Allergies  Allergen Reactions  . Amoxicillin Anaphylaxis  . Penicillins Anaphylaxis    Has patient had a PCN reaction causing immediate rash, facial/tongue/throat swelling, SOB or lightheadedness  with hypotension: Yes Has patient had a PCN reaction causing severe rash involving mucus membranes or skin necrosis: No Has patient had a PCN reaction that required hospitalization No Has patient had a PCN reaction occurring within the last 10 years: No If all of the above answers are "NO", then may proceed with Cephalosporin use.   Armond Hang [Naproxen Sodium] Other (See Comments)    Chest pain    Medications Prior to Admission  Medication Sig Dispense Refill Last Dose  . prenatal vitamin w/FE, FA (PRENATAL 1 + 1) 27-1 MG TABS tablet Take 1 tablet by mouth daily at 12 noon.   02/07/2021 at Unknown time    Review of Systems  Genitourinary: Positive for frequency and pelvic pain.  All other systems reviewed and are negative.  Physical Exam   Blood pressure (!) 145/67, pulse 76, temperature 98.6 F (37 C), resp. rate 18, height 5\' 3"  (1.6 m), weight 108.4 kg, last menstrual period 12/24/2020, SpO2 100 %.  Physical Exam Vitals and nursing note reviewed.  Constitutional:      General: She is not in acute distress. HENT:  Head: Normocephalic.  Eyes:     Pupils: Pupils are equal, round, and reactive to light.  Cardiovascular:     Rate and Rhythm: Normal rate.  Pulmonary:     Effort: Pulmonary effort is normal.  Abdominal:     General: Abdomen is flat.     Tenderness: There is no abdominal tenderness.  Skin:    General: Skin is warm and dry.  Neurological:     Mental Status: She is alert and oriented to person, place, and time.  Psychiatric:        Mood and Affect: Mood normal.    Results for orders placed or performed during the hospital encounter of 02/07/21 (from the past 24 hour(s))  Urinalysis, Routine w reflex microscopic Urine, Clean Catch     Status: Abnormal   Collection Time: 02/07/21  7:14 PM  Result Value Ref Range   Color, Urine YELLOW YELLOW   APPearance HAZY (A) CLEAR   Specific Gravity, Urine 1.013 1.005 - 1.030   pH 6.0 5.0 - 8.0   Glucose, UA NEGATIVE  NEGATIVE mg/dL   Hgb urine dipstick NEGATIVE NEGATIVE   Bilirubin Urine NEGATIVE NEGATIVE   Ketones, ur NEGATIVE NEGATIVE mg/dL   Protein, ur NEGATIVE NEGATIVE mg/dL   Nitrite NEGATIVE NEGATIVE   Leukocytes,Ua NEGATIVE NEGATIVE  CBC     Status: Abnormal   Collection Time: 02/07/21  7:35 PM  Result Value Ref Range   WBC 17.3 (H) 4.0 - 10.5 K/uL   RBC 4.48 3.87 - 5.11 MIL/uL   Hemoglobin 13.1 12.0 - 15.0 g/dL   HCT 97.4 16.3 - 84.5 %   MCV 86.4 80.0 - 100.0 fL   MCH 29.2 26.0 - 34.0 pg   MCHC 33.9 30.0 - 36.0 g/dL   RDW 36.4 68.0 - 32.1 %   Platelets 236 150 - 400 K/uL   nRBC 0.0 0.0 - 0.2 %   US OB Comp Less 14 Wks  Result Date: 02/07/2021 CLINICAL DATA:  Cramping. EXAM: OBSTETRIC <14 WK ULTRASOUND TECHNIQUE: Transabdominal ultrasound was performed for evaluation of the gestation as well as the maternal uterus and adnexal regions. COMPARISON:  None. FINDINGS: Intrauterine gestational sac: Single Yolk sac:  Visualized. Embryo:  Visualized. Cardiac Activity: Visualized. Heart Rate: 122 bpm CRL:   6.7 mm   6 w 3 d                  [place] EDC: September 30, 2021 Subchorionic hemorrhage:  None visualized. Maternal uterus/adnexae: A 7.7 cm x 6.2 cm x 7.9 cm anechoic structure is seen within the right ovary. No abnormal flow is noted within this area on color Doppler evaluation. The left ovary is visualized and is normal in appearance. IMPRESSION: 1. Single, viable intrauterine pregnancy at approximately 6 weeks and 3 days gestation by ultrasound evaluation. 2. Large, simple right ovarian cyst. Electronically Signed   By: Aram Candela M.D.   On: 02/07/2021 20:13    MAU Course  Procedures  MDM   Assessment and Plan   1. Intrauterine pregnancy   2. Abdominal pain affecting pregnancy   3. Pelvic pain affecting pregnancy in first trimester, antepartum   4. [redacted] weeks gestation of pregnancy   5. Corpus luteum cyst of right ovary    DC home 1st Trimester precautions  Bleeding precautions RX:  none  Return to MAU as needed FU with OB as planned   Follow-up Information    [facility] Follow up.   Specialty: Obstetrics and Gynecology  Contact information: [address] [place] [address] [phone]             Thressa Sheller DNP, CNM  02/07/21  8:36 PM

## 2021-02-09 LAB — GC/CHLAMYDIA PROBE AMP (~~LOC~~) NOT AT ARMC
Chlamydia: NEGATIVE
Comment: NEGATIVE
Comment: NORMAL
Neisseria Gonorrhea: NEGATIVE

## 2021-02-20 ENCOUNTER — Encounter (HOSPITAL_COMMUNITY): Payer: Self-pay

## 2021-02-20 ENCOUNTER — Other Ambulatory Visit: Payer: Self-pay

## 2021-02-20 ENCOUNTER — Ambulatory Visit (HOSPITAL_COMMUNITY)
Admission: EM | Admit: 2021-02-20 | Discharge: 2021-02-20 | Disposition: A | Discharge status: Home / Self Care | Payer: Medicaid Other | Attending: Medical Oncology | Admitting: Medical Oncology

## 2021-02-20 DIAGNOSIS — O9A211 Injury, poisoning and certain other consequences of external causes complicating pregnancy, first trimester: Secondary | ICD-10-CM

## 2021-02-20 DIAGNOSIS — Z3A01 Less than 8 weeks gestation of pregnancy: Secondary | ICD-10-CM | POA: Diagnosis not present

## 2021-02-20 DIAGNOSIS — S161XXA Strain of muscle, fascia and tendon at neck level, initial encounter: Secondary | ICD-10-CM

## 2021-02-20 DIAGNOSIS — M545 Low back pain, unspecified: Secondary | ICD-10-CM

## 2021-02-20 MED ORDER — ACETAMINOPHEN 500 MG PO TABS: 500.0000 mg | ORAL_TABLET | Freq: Four times a day (QID) | ORAL | 0 refills | Status: DC | PRN

## 2021-02-20 MED ORDER — LIDOCAINE 5 % EX PTCH: 1.0000 | MEDICATED_PATCH | CUTANEOUS | 0 refills | Status: DC
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
## 2021-02-23 ENCOUNTER — Other Ambulatory Visit: Payer: Self-pay

## 2021-02-24 ENCOUNTER — Other Ambulatory Visit: Payer: Self-pay

## 2021-02-27 ENCOUNTER — Telehealth: Payer: Self-pay | Admitting: *Deleted

## 2021-02-27 ENCOUNTER — Other Ambulatory Visit: Payer: Self-pay | Admitting: Women's Health

## 2021-02-27 MED ORDER — DOXYLAMINE-PYRIDOXINE 10-10 MG PO TBEC: DELAYED_RELEASE_TABLET | ORAL | 6 refills | Status: DC

## 2021-02-27 NOTE — Telephone Encounter (Signed)
(  New OB)Patient wants to know if she could have a prescription sent to her Pharmacy [facility] on [address] in [place] for morning sickness. Patient also wants to know if she needs to come in to seen. Clinical staff will follow up with patient.

## 2021-03-18 ENCOUNTER — Other Ambulatory Visit: Payer: Self-pay | Admitting: Obstetrics & Gynecology

## 2021-03-18 DIAGNOSIS — Z3490 Encounter for supervision of normal pregnancy, unspecified, unspecified trimester: Secondary | ICD-10-CM | POA: Insufficient documentation

## 2021-03-18 DIAGNOSIS — O0990 Supervision of high risk pregnancy, unspecified, unspecified trimester: Secondary | ICD-10-CM | POA: Insufficient documentation

## 2021-03-18 DIAGNOSIS — Z3682 Encounter for antenatal screening for nuchal translucency: Secondary | ICD-10-CM

## 2021-03-19 ENCOUNTER — Ambulatory Visit: Payer: Medicaid Other | Admitting: *Deleted

## 2021-03-19 ENCOUNTER — Encounter: Payer: Self-pay | Admitting: Advanced Practice Midwife

## 2021-03-19 ENCOUNTER — Other Ambulatory Visit: Payer: Self-pay

## 2021-03-19 ENCOUNTER — Ambulatory Visit (INDEPENDENT_AMBULATORY_CARE_PROVIDER_SITE_OTHER): Payer: Medicaid Other | Admitting: Advanced Practice Midwife

## 2021-03-19 ENCOUNTER — Ambulatory Visit (INDEPENDENT_AMBULATORY_CARE_PROVIDER_SITE_OTHER): Payer: Medicaid Other

## 2021-03-19 VITALS — BP 132/87 | HR 77 | Wt 247.0 lb

## 2021-03-19 DIAGNOSIS — Z3682 Encounter for antenatal screening for nuchal translucency: Secondary | ICD-10-CM | POA: Diagnosis not present

## 2021-03-19 DIAGNOSIS — Z3A12 12 weeks gestation of pregnancy: Secondary | ICD-10-CM | POA: Diagnosis not present

## 2021-03-19 DIAGNOSIS — Z3480 Encounter for supervision of other normal pregnancy, unspecified trimester: Secondary | ICD-10-CM | POA: Diagnosis not present

## 2021-03-19 LAB — POCT URINALYSIS DIPSTICK OB
Blood, UA: NEGATIVE
Glucose, UA: NEGATIVE
Ketones, UA: NEGATIVE
Leukocytes, UA: NEGATIVE
Nitrite, UA: NEGATIVE
POC,PROTEIN,UA: NEGATIVE

## 2021-03-19 MED ORDER — BLOOD PRESSURE MONITOR MISC: 0 refills | Status: DC

## 2021-03-19 MED ORDER — ONDANSETRON 4 MG PO TBDP: 4.0000 mg | ORAL_TABLET | Freq: Four times a day (QID) | ORAL | 2 refills | Status: DC | PRN

## 2021-03-19 NOTE — Progress Notes (Signed)
  [place] 12+1 wks,measurements c/w dates,crl 60.14 mm,fhr 153 bpm,NB present,NT 1.8 mm,normal left ovary,two simple right ovarian cysts (#1) 7.6 x 6 x 7.2 cm,(#2) 3.4 x 2.3 x 3.4 cm,posterior placenta

## 2021-03-19 NOTE — Patient Instructions (Addendum)
Isabel Conner, I greatly value your feedback.  If you receive a survey following your visit with us today, we appreciate you taking the time to fill it out.  Thanks, Isabel BeamsFran Cresenzo-Dishmon, DNP, CNM  [facility] is now [facility] ([address] [place], [place] [id number]) Entrance located off of [address] Free 24/7 valet parking   Nausea & Vomiting  Have saltine crackers or pretzels by your bed and eat a few bites before you raise your head out of bed in the morning  Eat small frequent meals throughout the day instead of large meals  Drink plenty of fluids throughout the day to stay hydrated, just don't drink a lot of fluids with your meals.  This can make your stomach fill up faster making you feel sick  Do not brush your teeth right after you eat  Products with real ginger are good for nausea, like ginger ale and ginger hard candy Make sure it says made with real ginger!  Sucking on sour candy like lemon heads is also good for nausea  If your prenatal vitamins make you nauseated, take them at night so you will sleep through the nausea  Sea Bands  If you feel like you need medicine for the nausea & vomiting please let us know  If you are unable to keep any fluids or food down please let us know   Constipation  Drink plenty of fluid, preferably water, throughout the day  Eat foods high in fiber such as fruits, vegetables, and grains  Exercise, such as walking, is a good way to keep your bowels regular  Drink warm fluids, especially warm prune juice, or decaf coffee  Eat a 1/2 cup of real oatmeal (not instant), 1/2 cup applesauce, and 1/2-1 cup warm prune juice every day  If needed, you may take Colace (docusate sodium) stool softener once or twice a day to help keep the stool soft.   If you still are having problems with constipation, you may take Miralax once daily as needed to help keep your bowels regular.   Home  Blood Pressure Monitoring for Patients   Your provider has recommended that you check your blood pressure (BP) at least once a week at home. If you do not have a blood pressure cuff at home, one will be provided for you. Contact your provider if you have not received your monitor within 1 week.   Helpful Tips for Accurate Home Blood Pressure Checks  . Don't smoke, exercise, or drink caffeine 30 minutes before checking your BP . Use the restroom before checking your BP (a full bladder can raise your pressure) . Relax in a comfortable upright chair . Feet on the ground . Left arm resting comfortably on a flat surface at the level of your heart . Legs uncrossed . Back supported . Sit quietly and don't talk . Place the cuff on your bare arm . Adjust snuggly, so that only two fingertips can fit between your skin and the top of the cuff . Check 2 readings separated by at least one minute . Keep a log of your BP readings . For a visual, please reference this diagram: http://ccnc.care/bpdiagram  Provider Name: [facility]     Phone: [phone]  Zone 1: ALL CLEAR  Continue to monitor your symptoms:  . BP reading is less than 140 (top number) or less than 90 (bottom number)  . No right upper stomach  pain . No headaches or seeing spots . No feeling nauseated or throwing up . No swelling in face and hands  Zone 2: CAUTION Call your doctor's office for any of the following:  . BP reading is greater than 140 (top number) or greater than 90 (bottom number)  . Stomach pain under your ribs in the middle or right side . Headaches or seeing spots . Feeling nauseated or throwing up . Swelling in face and hands  Zone 3: EMERGENCY  Seek immediate medical care if you have any of the following:  . BP reading is greater than160 (top number) or greater than 110 (bottom number) . Severe headaches not improving with Tylenol . Serious difficulty catching your breath . Any worsening symptoms  from Zone 2    First Trimester of Pregnancy The first trimester of pregnancy is from week 1 until the end of week 12 (months 1 through 3). A week after a sperm fertilizes an egg, the egg will implant on the wall of the uterus. This embryo will begin to develop into a baby. Genes from you and your partner are forming the baby. The female genes determine whether the baby is a boy or a girl. At 6-8 weeks, the eyes and face are formed, and the heartbeat can be seen on ultrasound. At the end of 12 weeks, all the baby's organs are formed.  Now that you are pregnant, you will want to do everything you can to have a healthy baby. Two of the most important things are to get good prenatal care and to follow your health care provider's instructions. Prenatal care is all the medical care you receive before the baby's birth. This care will help prevent, find, and treat any problems during the pregnancy and childbirth. BODY CHANGES Your body goes through many changes during pregnancy. The changes vary from woman to woman.   You may gain or lose a couple of pounds at first.  You may feel sick to your stomach (nauseous) and throw up (vomit). If the vomiting is uncontrollable, call your health care provider.  You may tire easily.  You may develop headaches that can be relieved by medicines approved by your health care provider.  You may urinate more often. Painful urination may mean you have a bladder infection.  You may develop heartburn as a result of your pregnancy.  You may develop constipation because certain hormones are causing the muscles that push waste through your intestines to slow down.  You may develop hemorrhoids or swollen, bulging veins (varicose veins).  Your breasts may begin to grow larger and become tender. Your nipples may stick out more, and the tissue that surrounds them (areola) may become darker.  Your gums may bleed and may be sensitive to brushing and flossing.  Dark spots or  blotches (chloasma, mask of pregnancy) may develop on your face. This will likely fade after the baby is born.  Your menstrual periods will stop.  You may have a loss of appetite.  You may develop cravings for certain kinds of food.  You may have changes in your emotions from day to day, such as being excited to be pregnant or being concerned that something may go wrong with the pregnancy and baby.  You may have more vivid and strange dreams.  You may have changes in your hair. These can include thickening of your hair, rapid growth, and changes in texture. Some women also have hair loss during or after pregnancy, or hair that  feels dry or thin. Your hair will most likely return to normal after your baby is born. WHAT TO EXPECT AT YOUR PRENATAL VISITS During a routine prenatal visit:  You will be weighed to make sure you and the baby are growing normally.  Your blood pressure will be taken.  Your abdomen will be measured to track your baby's growth.  The fetal heartbeat will be listened to starting around week 10 or 12 of your pregnancy.  Test results from any previous visits will be discussed. Your health care provider may ask you:  How you are feeling.  If you are feeling the baby move.  If you have had any abnormal symptoms, such as leaking fluid, bleeding, severe headaches, or abdominal cramping.  If you have any questions. Other tests that may be performed during your first trimester include:  Blood tests to find your blood type and to check for the presence of any previous infections. They will also be used to check for low iron levels (anemia) and Rh antibodies. Later in the pregnancy, blood tests for diabetes will be done along with other tests if problems develop.  Urine tests to check for infections, diabetes, or protein in the urine.  An ultrasound to confirm the proper growth and development of the baby.  An amniocentesis to check for possible genetic  problems.  Fetal screens for spina bifida and Down syndrome.  You may need other tests to make sure you and the baby are doing well. HOME CARE INSTRUCTIONS  Medicines  Follow your health care provider's instructions regarding medicine use. Specific medicines may be either safe or unsafe to take during pregnancy.  Take your prenatal vitamins as directed.  If you develop constipation, try taking a stool softener if your health care provider approves. Diet  Eat regular, well-balanced meals. Choose a variety of foods, such as meat or vegetable-based protein, fish, milk and low-fat dairy products, vegetables, fruits, and whole grain breads and cereals. Your health care provider will help you determine the amount of weight gain that is right for you.  Avoid raw meat and uncooked cheese. These carry germs that can cause birth defects in the baby.  Eating four or five small meals rather than three large meals a day may help relieve nausea and vomiting. If you start to feel nauseous, eating a few soda crackers can be helpful. Drinking liquids between meals instead of during meals also seems to help nausea and vomiting.  If you develop constipation, eat more high-fiber foods, such as fresh vegetables or fruit and whole grains. Drink enough fluids to keep your urine clear or pale yellow. Activity and Exercise  Exercise only as directed by your health care provider. Exercising will help you:  Control your weight.  Stay in shape.  Be prepared for labor and delivery.  Experiencing pain or cramping in the lower abdomen or low back is a good sign that you should stop exercising. Check with your health care provider before continuing normal exercises.  Try to avoid standing for long periods of time. Move your legs often if you must stand in one place for a long time.  Avoid heavy lifting.  Wear low-heeled shoes, and practice good posture.  You may continue to have sex unless your health care  provider directs you otherwise. Relief of Pain or Discomfort  Wear a good support bra for breast tenderness.    Take warm sitz baths to soothe any pain or discomfort caused by hemorrhoids. Use hemorrhoid cream  if your health care provider approves.    Rest with your legs elevated if you have leg cramps or low back pain.  If you develop varicose veins in your legs, wear support hose. Elevate your feet for 15 minutes, 3-4 times a day. Limit salt in your diet. Prenatal Care  Schedule your prenatal visits by the twelfth week of pregnancy. They are usually scheduled monthly at first, then more often in the last 2 months before delivery.  Write down your questions. Take them to your prenatal visits.  Keep all your prenatal visits as directed by your health care provider. Safety  Wear your seat belt at all times when driving.  Make a list of emergency phone numbers, including numbers for family, friends, the hospital, and police and fire departments. General Tips  Ask your health care provider for a referral to a local prenatal education class. Begin classes no later than at the beginning of month 6 of your pregnancy.  Ask for help if you have counseling or nutritional needs during pregnancy. Your health care provider can offer advice or refer you to specialists for help with various needs.  Do not use hot tubs, steam rooms, or saunas.  Do not douche or use tampons or scented sanitary pads.  Do not cross your legs for long periods of time.  Avoid cat litter boxes and soil used by cats. These carry germs that can cause birth defects in the baby and possibly loss of the fetus by miscarriage or stillbirth.  Avoid all smoking, herbs, alcohol, and medicines not prescribed by your health care provider. Chemicals in these affect the formation and growth of the baby.  Schedule a dentist appointment. At home, brush your teeth with a soft toothbrush and be gentle when you floss. SEEK MEDICAL  CARE IF:   You have dizziness.  You have mild pelvic cramps, pelvic pressure, or nagging pain in the abdominal area.  You have persistent nausea, vomiting, or diarrhea.  You have a bad smelling vaginal discharge.  You have pain with urination.  You notice increased swelling in your face, hands, legs, or ankles. SEEK IMMEDIATE MEDICAL CARE IF:   You have a fever.  You are leaking fluid from your vagina.  You have spotting or bleeding from your vagina.  You have severe abdominal cramping or pain.  You have rapid weight gain or loss.  You vomit blood or material that looks like coffee grounds.  You are exposed to [place] measles and have never had them.  You are exposed to fifth disease or chickenpox.  You develop a severe headache.  You have shortness of breath.  You have any kind of trauma, such as from a fall or a car accident. Document Released: 11/09/2001 Document Revised: 04/01/2014 Document Reviewed: 09/25/2013 [facility] Patient Information 2015 [place], [place]. This information is not intended to replace advice given to you by your health care provider. Make sure you discuss any questions you have with your health care provider.  Coronavirus (COVID-19) Are you at risk?  Are you at risk for the Coronavirus (COVID-19)?  To be considered HIGH RISK for Coronavirus (COVID-19), you have to meet the following criteria:  . Traveled to [place], [place], [place], [place] or [place];  and have fever, cough, and shortness of breath within the last 2 weeks of travel OR . Been in close contact with a person diagnosed with COVID-19 within the last 2 weeks and have fever, cough, and shortness of breath . IF YOU DO  NOT MEET THESE CRITERIA, YOU ARE CONSIDERED LOW RISK FOR COVID-19.  What to do if you are HIGH RISK for COVID-19?  Marland Kitchen If you are having a medical emergency, call 911. . Seek medical care right away. Before you go to a doctor's office, urgent care or emergency department,  call ahead and tell them about your recent travel, contact with someone diagnosed with COVID-19, and your symptoms. You should receive instructions from your physician's office regarding next steps of care.  . When you arrive at healthcare provider, tell the healthcare staff immediately you have returned from visiting [place], [place], [place], [place] or [place]; in the last two weeks or you have been in close contact with a person diagnosed with COVID-19 in the last 2 weeks.   . Tell the health care staff about your symptoms: fever, cough and shortness of breath. . After you have been seen by a medical provider, you will be either: o Tested for (COVID-19) and discharged home on quarantine except to seek medical care if symptoms worsen, and asked to  - Stay home and avoid contact with others until you get your results (4-5 days)  - Avoid travel on public transportation if possible (such as bus, train, or airplane) or o Sent to the Emergency Department by EMS for evaluation, COVID-19 testing, and possible admission depending on your condition and test results.  What to do if you are LOW RISK for COVID-19?  Reduce your risk of any infection by using the same precautions used for avoiding the common cold or flu:  Marland Kitchen Wash your hands often with soap and warm water for at least 20 seconds.  If soap and water are not readily available, use an alcohol-based hand sanitizer with at least 60% alcohol.  . If coughing or sneezing, cover your mouth and nose by coughing or sneezing into the elbow areas of your shirt or coat, into a tissue or into your sleeve (not your hands). . Avoid shaking hands with others and consider head nods or verbal greetings only. . Avoid touching your eyes, nose, or mouth with unwashed hands.  . Avoid close contact with people who are sick. . Avoid places or events with large numbers of people in one location, like concerts or sporting events. . Carefully consider travel plans you have or  are making. . If you are planning any travel outside or inside the [place], visit the CDC's Travelers' Health webpage for the latest health notices. . If you have some symptoms but not all symptoms, continue to monitor at home and seek medical attention if your symptoms worsen. . If you are having a medical emergency, call 911.   [place] / e-Visit: [email or web address]         [facility]: [facility]: [phone]                   [facility]: [phone]     Safe Medications in Pregnancy   Acne: Benzoyl Peroxide Salicylic Acid  Backache/Headache: Tylenol: 2 regular strength every 4 hours OR              2 Extra strength every 6 hours  Colds/Coughs/Allergies: Benadryl (alcohol free) 25 mg every 6 hours as needed Breath right strips Claritin Cepacol throat lozenges Chloraseptic throat spray Cold-Eeze- up to three times per day Cough drops, alcohol free Flonase (by prescription only) Guaifenesin Mucinex Robitussin DM (plain only, alcohol  free) Saline nasal spray/drops Sudafed (pseudoephedrine) & Actifed ** use only after [redacted] weeks gestation and if you do not have high blood pressure Tylenol Vicks Vaporub Zinc lozenges Zyrtec   Constipation: Colace Ducolax suppositories Fleet enema Glycerin suppositories Metamucil Milk of magnesia Miralax Senokot Smooth move tea  Diarrhea: Kaopectate Imodium A-D  *NO pepto Bismol  Hemorrhoids: Anusol Anusol HC Preparation H Tucks  Indigestion: Tums Maalox Mylanta Zantac  Pepcid  Insomnia: Benadryl (alcohol free) 25mg  every 6 hours as needed Tylenol PM Unisom, no Gelcaps  Leg Cramps: Tums MagGel  Nausea/Vomiting:  Bonine Dramamine Emetrol Ginger extract Sea bands Meclizine  Nausea medication to take during pregnancy:  Unisom (doxylamine succinate  25 mg tablets) Take one tablet daily at bedtime. If symptoms are not adequately controlled, the dose can be increased to a maximum recommended dose of two tablets daily (1/2 tablet in the morning, 1/2 tablet mid-afternoon and one at bedtime). Vitamin B6 100mg  tablets. Take one tablet twice a day (up to 200 mg per day).  Skin Rashes: Aveeno products Benadryl cream or 25mg  every 6 hours as needed Calamine Lotion 1% cortisone cream  Yeast infection: Gyne-lotrimin 7 Monistat 7   **If taking multiple medications, please check labels to avoid duplicating the same active ingredients **take medication as directed on the label ** Do not exceed 4000 mg of tylenol in 24 hours **Do not take medications that contain aspirin or ibuprofen   Sciatica Rehab Ask your health care provider which exercises are safe for you. Do exercises exactly as told by your health care provider and adjust them as directed. It is normal to feel mild stretching, pulling, tightness, or discomfort as you do these exercises, but you should stop right away if you feel sudden pain or your pain gets worse.Do not begin these exercises until told by your health care provider. Stretching and range of motion exercises These exercises warm up your muscles and joints and improve the movement and flexibility of your hips and your back. These exercises also help to relieve pain, numbness, and tingling. Exercise A: Sciatic nerve glide  1. Sit in a chair with your head facing down toward your chest. Place your hands behind your back. Let your shoulders slump forward. 2. Slowly straighten one of your knees while you tilt your head back as if you are looking toward the ceiling. Only straighten your leg as far as you can without making your symptoms worse. 3. Hold for __________ seconds. 4. Slowly return to the starting position. 5. Repeat with your other leg. Repeat __________ times. Complete this exercise __________ times a day. Exercise  B: Knee to chest with hip adduction and internal rotation    1. Lie on your back on a firm surface with both legs straight. 2. Bend one of your knees and move it up toward your chest until you feel a gentle stretch in your lower back and buttock. Then, move your knee toward the shoulder that is on the opposite side from your leg. ? Hold your leg in this position by holding onto the front of your knee. 3. Hold for __________ seconds. 4. Slowly return to the starting position. 5. Repeat with your other leg. Repeat __________ times. Complete this exercise __________ times a day. Exercise C: Prone extension on elbows    1. Lie on your abdomen on a firm surface. A bed may be too soft for this exercise. 2. Prop yourself up on your elbows. 3. Use your arms to help lift your  chest up until you feel a gentle stretch in your abdomen and your lower back. ? This will place some of your body weight on your elbows. If this is uncomfortable, try stacking pillows under your chest. ? Your hips should stay down, against the surface that you are lying on. Keep your hip and back muscles relaxed. 4. Hold for __________ seconds. 5. Slowly relax your upper body and return to the starting position. Repeat __________ times. Complete this exercise __________ times a day. Strengthening exercises These exercises build strength and endurance in your back. Endurance is the ability to use your muscles for a long time, even after they get tired. Exercise D: Pelvic tilt  1. Lie on your back on a firm surface. Bend your knees and keep your feet flat. 2. Tense your abdominal muscles. Tip your pelvis up toward the ceiling and flatten your lower back into the floor. ? To help with this exercise, you may place a small towel under your lower back and try to push your back into the towel. 3. Hold for __________ seconds. 4. Let your muscles relax completely before you repeat this exercise. Repeat __________ times. Complete  this exercise __________ times a day. Exercise E: Alternating arm and leg raises    1. Get on your hands and knees on a firm surface. If you are on a hard floor, you may want to use padding to cushion your knees, such as an exercise mat. 2. Line up your arms and legs. Your hands should be below your shoulders, and your knees should be below your hips. 3. Lift your left leg behind you. At the same time, raise your right arm and straighten it in front of you. ? Do not lift your leg higher than your hip. ? Do not lift your arm higher than your shoulder. ? Keep your abdominal and back muscles tight. ? Keep your hips facing the ground. ? Do not arch your back. ? Keep your balance carefully, and do not hold your breath. 4. Hold for __________ seconds. 5. Slowly return to the starting position and repeat with your right leg and your left arm. Repeat __________ times. Complete this exercise __________ times a day. Posture and body mechanics    Body mechanics refers to the movements and positions of your body while you do your daily activities. Posture is part of body mechanics. Good posture and healthy body mechanics can help to relieve stress in your body's tissues and joints. Good posture means that your spine is in its natural S-curve position (your spine is neutral), your shoulders are pulled back slightly, and your head is not tipped forward. The following are general guidelines for applying improved posture and body mechanics to your everyday activities. Standing     When standing, keep your spine neutral and your feet about hip-width apart. Keep a slight bend in your knees. Your ears, shoulders, and hips should line up.  When you do a task in which you stand in one place for a long time, place one foot up on a stable object that is 2-4 inches (5-10 cm) high, such as a footstool. This helps keep your spine neutral. Sitting     When sitting, keep your spine neutral and keep your feet  flat on the floor. Use a footrest, if necessary, and keep your thighs parallel to the floor. Avoid rounding your shoulders, and avoid tilting your head forward.  When working at a desk or a Animator, keep your desk at  a height where your hands are slightly lower than your elbows. Slide your chair under your desk so you are close enough to maintain good posture.  When working at a computer, place your monitor at a height where you are looking straight ahead and you do not have to tilt your head forward or downward to look at the screen. Resting     When lying down and resting, avoid positions that are most painful for you.  If you have pain with activities such as sitting, bending, stooping, or squatting (flexion-based activities), lie in a position in which your body does not bend very much. For example, avoid curling up on your side with your arms and knees near your chest (fetal position).  If you have pain with activities such as standing for a long time or reaching with your arms (extension-based activities), lie with your spine in a neutral position and bend your knees slightly. Try the following positions: ? Lying on your side with a pillow between your knees. ? Lying on your back with a pillow under your knees. Lifting     When lifting objects, keep your feet at least shoulder-width apart and tighten your abdominal muscles.  Bend your knees and hips and keep your spine neutral. It is important to lift using the strength of your legs, not your back. Do not lock your knees straight out.  Always ask for help to lift heavy or awkward objects. This information is not intended to replace advice given to you by your health care provider. Make sure you discuss any questions you have with your health care provider. Document Released: 11/15/2005 Document Revised: 07/22/2016 Document Reviewed: 08/01/2015 Elsevier Interactive Patient Education  2017 [employer].

## 2021-03-19 NOTE — Progress Notes (Signed)
INITIAL OBSTETRICAL VISIT Patient name: shalika arntz MRN [id number]  Date of birth: December 15, 1995 Chief Complaint:   Initial Prenatal Visit (Nt/it)  History of Present Illness:   naveyah iacovelli is a 25 y.o. [id number]  female at [redacted]w[redacted]d by LMP c/w u/s at 6 weeks with an Estimated Date of Delivery: 09/30/21 being seen today for her initial obstetrical visit.   Her obstetrical history is significant for 2 TABs and 2 ectopics.   Today she reports no complaints.  Depression screen [facility] 2/9 03/19/2021  Decreased Interest 2  Down, Depressed, Hopeless 3  PHQ - 2 Score 5  Altered sleeping 2  Tired, decreased energy 2  Change in appetite 2  Feeling bad or failure about yourself  1  Trouble concentrating 0  Moving slowly or fidgety/restless 0  Suicidal thoughts 1  PHQ-9 Score 13    Patient's last menstrual period was 12/24/2020 (exact date). Last pap "LONG TIME AGO" . Results were: normal per pt Review of Systems:   Pertinent items are noted in HPI Denies cramping/contractions, leakage of fluid, vaginal bleeding, abnormal vaginal discharge w/ itching/odor/irritation, headaches, visual changes, shortness of breath, chest pain, abdominal pain, severe nausea/vomiting, or problems with urination or bowel movements unless otherwise stated above.  Pertinent History Reviewed:  Reviewed past medical,surgical, social, obstetrical and family history.  Reviewed problem list, medications and allergies. OB History  Gravida Para Term Preterm AB Living  5 0     4 0  SAB IAB Ectopic Multiple Live Births  1 2 1         # Outcome Date GA Lbr Len/2nd Weight Sex Delivery Anes PTL Lv  5 Current           4 IAB 2016          3 IAB           2 SAB           1 Ectopic            Physical Assessment:   Vitals:   03/19/21 0900  BP: 132/87  Pulse: 77  Weight: 247 lb (112 kg)  Body mass index is 43.75 kg/m.       Physical Examination:  General appearance - well appearing, and in no distress  Mental  status - alert, oriented to person, place, and time  Psych:  She has a normal mood and affect  Skin - warm and dry, normal color, no suspicious lesions noted  Chest - effort normal  Heart - normal rate and regular rhythm  Abdomen - soft, nontender  Extremities:  No swelling or varicosities noted     DECLINED PAP TODAY   TODAY'S NT 03/21/21 12+1 wks,measurements c/w dates,crl 60.14 mm,fhr 153 bpm,NB present,NT 1.8 mm,normal left ovary,two simple right ovarian cysts (#1) 7.6 x 6 x 7.2 cm,(#2) 3.4 x 2.3 x 3.4 cm,posterior placenta   Results for orders placed or performed in visit on 03/19/21 (from the past 24 hour(s))  POC Urinalysis Dipstick OB   Collection Time: 03/19/21 10:01 AM  Result Value Ref Range   Color, UA     Clarity, UA     Glucose, UA Negative Negative   Bilirubin, UA     Ketones, UA neg    Spec Grav, UA     Blood, UA neg    pH, UA     POC,PROTEIN,UA Negative Negative, Trace, Small (1+), Moderate (2+), Large (3+), 4+   Urobilinogen, UA     Nitrite, UA  neg    Leukocytes, UA Negative Negative   Appearance     Odor      Assessment & Plan:  1) lOW-Risk Pregnancy [id number] at [redacted]w[redacted]d with an Estimated Date of Delivery: 09/30/21   2) Initial OB visit    Meds:  Meds ordered this encounter  Medications  . Blood Pressure Monitor MISC    Sig: For regular home bp monitoring during pregnancy    Dispense:  1 each    Refill:  0    Needs a large cuff Z34.01  . ondansetron (ZOFRAN ODT) 4 MG disintegrating tablet    Sig: Take 1 tablet (4 mg total) by mouth every 6 (six) hours as needed for nausea.    Dispense:  30 tablet    Refill:  2    Order Specific Question:   Supervising Provider    Answer:   Duane Lope H [[id number]]    Initial labs obtained Continue prenatal vitamins Reviewed n/v relief measures and warning s/s to report Reviewed recommended weight gain based on pre-gravid BMI Encouraged well-balanced diet Genetic & carrier screening discussed: requests Panorama, NT/IT  and Horizon 14 , declines AFP Ultrasound discussed; fetal survey: requested CCNC completed> form faxed if has or is planning to apply for medicaid The nature of [facility] [employer] with multiple MDs and other Advanced Practice Providers was explained to patient; also emphasized that fellows, residents, and students are part of our team. gIVEN home bp cuff.. Check bp weekly, let us know if >140/90.        Scarlette Calico Cresenzo-Dishmon 10:57 AM

## 2021-03-20 ENCOUNTER — Other Ambulatory Visit: Payer: Self-pay | Admitting: *Deleted

## 2021-03-20 DIAGNOSIS — O0991 Supervision of high risk pregnancy, unspecified, first trimester: Secondary | ICD-10-CM

## 2021-03-20 DIAGNOSIS — O24319 Unspecified pre-existing diabetes mellitus in pregnancy, unspecified trimester: Secondary | ICD-10-CM

## 2021-03-20 DIAGNOSIS — O24419 Gestational diabetes mellitus in pregnancy, unspecified control: Secondary | ICD-10-CM | POA: Insufficient documentation

## 2021-03-20 LAB — PMP SCREEN PROFILE (10S), URINE
Amphetamine Scrn, Ur: NEGATIVE ng/mL
BARBITURATE SCREEN URINE: NEGATIVE ng/mL
BENZODIAZEPINE SCREEN, URINE: NEGATIVE ng/mL
CANNABINOIDS UR QL SCN: POSITIVE ng/mL — AB
Cocaine (Metab) Scrn, Ur: NEGATIVE ng/mL
Creatinine(Crt), U: 116.4 mg/dL (ref 20.0–300.0)
Methadone Screen, Urine: NEGATIVE ng/mL
OXYCODONE+OXYMORPHONE UR QL SCN: NEGATIVE ng/mL
Opiate Scrn, Ur: NEGATIVE ng/mL
Ph of Urine: 5.7 (ref 4.5–8.9)
Phencyclidine Qn, Ur: NEGATIVE ng/mL
Propoxyphene Scrn, Ur: NEGATIVE ng/mL

## 2021-03-20 MED ORDER — ACCU-CHEK GUIDE VI STRP: ORAL_STRIP | 12 refills | Status: DC

## 2021-03-20 MED ORDER — ACCU-CHEK GUIDE ME W/DEVICE KIT: 1.0000 | PACK | Freq: Four times a day (QID) | 0 refills | Status: DC

## 2021-03-20 MED ORDER — ACCU-CHEK SOFTCLIX LANCETS MISC: 12 refills | Status: DC

## 2021-03-21 LAB — INTEGRATED 1
Crown Rump Length: 60.1 mm
Gest. Age on Collection Date: 12.3 weeks
Maternal Age at EDD: 27.3 yr
Nuchal Translucency (NT): 1.8 mm
Number of Fetuses: 1
PAPP-A Value: 149.7 ng/mL
Weight: 247 [lb_av]

## 2021-03-21 LAB — CBC/D/PLT+RPR+RH+ABO+RUB AB...
Antibody Screen: NEGATIVE
Basophils Absolute: 0.1 10*3/uL (ref 0.0–0.2)
Basos: 1 %
EOS (ABSOLUTE): 0.1 10*3/uL (ref 0.0–0.4)
Eos: 1 %
HCV Ab: 0.1 s/co ratio (ref 0.0–0.9)
HIV Screen 4th Generation wRfx: NONREACTIVE
Hematocrit: 40 % (ref 34.0–46.6)
Hemoglobin: 13.2 g/dL (ref 11.1–15.9)
Hepatitis B Surface Ag: NEGATIVE
Immature Grans (Abs): 0.1 10*3/uL (ref 0.0–0.1)
Immature Granulocytes: 1 %
Lymphocytes Absolute: 3 10*3/uL (ref 0.7–3.1)
Lymphs: 23 %
MCH: 29.2 pg (ref 26.6–33.0)
MCHC: 33 g/dL (ref 31.5–35.7)
MCV: 89 fL (ref 79–97)
Monocytes Absolute: 0.6 10*3/uL (ref 0.1–0.9)
Monocytes: 5 %
Neutrophils Absolute: 9.4 10*3/uL — ABNORMAL HIGH (ref 1.4–7.0)
Neutrophils: 69 %
Platelets: 197 10*3/uL (ref 150–450)
RBC: 4.52 x10E6/uL (ref 3.77–5.28)
RDW: 13.1 % (ref 11.7–15.4)
RPR Ser Ql: NONREACTIVE
Rh Factor: POSITIVE
Rubella Antibodies, IGG: 1.22 index (ref >0.99)
WBC: 13.3 10*3/uL — ABNORMAL HIGH (ref 3.4–10.8)

## 2021-03-21 LAB — HCV INTERPRETATION

## 2021-03-21 LAB — URINE CULTURE

## 2021-03-21 LAB — HEMOGLOBIN A1C
Est. average glucose Bld gHb Est-mCnc: 146 mg/dL
Hgb A1c MFr Bld: 6.7 % — ABNORMAL HIGH (ref 4.8–5.6)

## 2021-03-22 LAB — GC/CHLAMYDIA PROBE AMP
Chlamydia trachomatis, NAA: UNDETERMINED — AB
Neisseria Gonorrhoeae by PCR: NEGATIVE

## 2021-03-26 ENCOUNTER — Other Ambulatory Visit: Payer: Medicaid Other

## 2021-04-01 ENCOUNTER — Inpatient Hospital Stay (HOSPITAL_COMMUNITY)
Admission: AD | Admit: 2021-04-01 | Discharge: 2021-04-01 | Disposition: A | Discharge status: Home / Self Care | Payer: Medicaid Other | Attending: Obstetrics and Gynecology | Admitting: Obstetrics and Gynecology

## 2021-04-01 ENCOUNTER — Inpatient Hospital Stay (HOSPITAL_BASED_OUTPATIENT_CLINIC_OR_DEPARTMENT_OTHER): Payer: Medicaid Other

## 2021-04-01 ENCOUNTER — Other Ambulatory Visit: Payer: Self-pay

## 2021-04-01 ENCOUNTER — Encounter (HOSPITAL_COMMUNITY): Payer: Self-pay | Admitting: Obstetrics and Gynecology

## 2021-04-01 DIAGNOSIS — Z3A14 14 weeks gestation of pregnancy: Secondary | ICD-10-CM

## 2021-04-01 DIAGNOSIS — Z880 Allergy status to penicillin: Secondary | ICD-10-CM | POA: Diagnosis not present

## 2021-04-01 DIAGNOSIS — O209 Hemorrhage in early pregnancy, unspecified: Secondary | ICD-10-CM

## 2021-04-01 DIAGNOSIS — O418X90 Other specified disorders of amniotic fluid and membranes, unspecified trimester, not applicable or unspecified: Secondary | ICD-10-CM | POA: Diagnosis present

## 2021-04-01 DIAGNOSIS — O4692 Antepartum hemorrhage, unspecified, second trimester: Secondary | ICD-10-CM

## 2021-04-01 DIAGNOSIS — O468X9 Other antepartum hemorrhage, unspecified trimester: Secondary | ICD-10-CM | POA: Diagnosis present

## 2021-04-01 DIAGNOSIS — O418X20 Other specified disorders of amniotic fluid and membranes, second trimester, not applicable or unspecified: Secondary | ICD-10-CM

## 2021-04-01 MED ORDER — FENTANYL CITRATE (PF) 100 MCG/2ML IJ SOLN
50.0000 ug | Freq: Once | INTRAMUSCULAR | Status: AC
Start: 2021-04-01 — End: 2021-04-01
  Administered 2021-04-01: 50 ug via INTRAMUSCULAR

## 2021-04-01 MED ORDER — ONDANSETRON 4 MG PO TBDP
4.0000 mg | ORAL_TABLET | Freq: Four times a day (QID) | ORAL | Status: DC | PRN
  Administered 2021-04-01: 4 mg via ORAL
  Filled 2021-04-01: qty 1

## 2021-04-01 MED ORDER — FENTANYL CITRATE (PF) 100 MCG/2ML IJ SOLN
50.0000 ug | Freq: Once | INTRAMUSCULAR | Status: DC
  Filled 2021-04-01: qty 2

## 2021-04-01 NOTE — Discharge Instructions (Signed)
Activity Restriction During Pregnancy Your health care provider may recommend specific activity restrictions during pregnancy for a variety of reasons. Activity restriction may require that you limit activities that require great effort, such as exercise, lifting, or sex. The type of activity restriction will vary for each person, depending on your risk or the problems you are having. Activity restriction may be recommended for a period of time until your baby is delivered. Why are activity restrictions recommended? Activity restriction may be recommended if:  Your placenta is partially or completely covering the opening of your cervix (placenta previa).  There is bleeding between the wall of the uterus and the amniotic sac in the first trimester of pregnancy (subchorionic hemorrhage).  You went into labor too early (preterm labor).  You have a history of miscarriage.  You have a condition that causes high blood pressure during pregnancy (preeclampsia or eclampsia).  You are pregnant with more than one baby.  Your baby is not growing well. What are the risks? The risks depend on your specific restriction. Strict bed rest has the most physical and emotional risks and is no longer routinely recommended. Risks of strict bed rest include:  Loss of muscle conditioning from not moving.  Blood clots.  Social isolation.  Depression.  Loss of income. Talk with your health care team about activity restriction to decide if it is best for you and your baby. Even if you are having problems during your pregnancy, you may be able to continue with normal levels of activity with careful monitoring by your health care team. Follow these instructions at home: If needed, based on your overall health and the health of your baby, your health care provider will decide which type of activity restriction is right for you. Activity restrictions may include:  Not lifting anything heavier than 10 pounds (4.5  kg).  Avoiding activities that take a lot of physical effort.  No lifting or straining.  Resting in a sitting position or lying down for periods of time during the day. Pelvic rest may be recommended along with activity restrictions. If pelvic rest is recommended, then:  Do not have sex, an orgasm, or use sexual stimulation.  Do not use tampons. Do not douche. Do not put anything into your vagina.  Do not lift anything that is heavier than 10 lb (4.5 kg).  Avoid activities that require a lot of effort.  Avoid any activity in which your pelvic muscles could become strained, such as squatting.   Questions to ask your health care provider  Why is my activity being limited?  How will activity restrictions affect my body?  Why is rest helpful for me and my baby?  What activities can I do?  When can I return to normal activities? When should I seek immediate medical care? Seek immediate medical care if you have:  Vaginal bleeding.  Vaginal discharge.  Cramping pain in your lower abdomen.  Regular contractions.  A low, dull backache. Summary  Your health care provider may recommend specific activity restrictions during pregnancy for a variety of reasons.  Activity restriction may require that you limit activities such as exercise, lifting, sex, or any other activity that requires great effort.  Discuss the risks and benefits of activity restriction with your health care team to decide if it is best for you and your baby.  Contact your health care provider right away if you think you are having contractions, or if you notice vaginal bleeding, discharge, or cramping. This information   is not intended to replace advice given to you by your health care provider. Make sure you discuss any questions you have with your health care provider. Document Revised: 08/08/2019 Document Reviewed: 03/07/2018 Elsevier Patient Education  2021 Elsevier Inc. Subchorionic Hematoma  A  hematoma is a collection of blood outside of the blood vessels. A subchorionic hematoma is a collection of blood between the outer wall of the embryo (chorion) and the inner wall of the uterus. This condition can cause vaginal bleeding. Early small hematomas usually shrink on their own and do not affect your baby or pregnancy. When bleeding starts later in pregnancy, or if the hematoma is larger or occurs in older pregnant women, the condition may be more serious. Larger hematomas increase the chances of miscarriage. This condition also increases the risk of:  Premature separation of the placenta from the uterus.  Premature (preterm) labor.  Stillbirth. What are the causes? The exact cause of this condition is not known. It occurs when blood is trapped between the placenta and the uterine wall because the placenta has separated from the original site of implantation. What increases the risk? You are more likely to develop this condition if:  You were treated with fertility medicines.  You became pregnant through in vitro fertilization (IVF). What are the signs or symptoms? Symptoms of this condition include:  Vaginal spotting or bleeding.  Abdominal pain. This is rare. Sometimes you may have no symptoms and the bleeding may only be seen when ultrasound images are taken (transvaginal ultrasound). How is this diagnosed? This condition is diagnosed based on a physical exam. This includes a pelvic exam. You may also have other tests, including:  Blood tests.  Urine tests.  Ultrasound of the abdomen. How is this treated? Treatment for this condition can vary. Treatment may include:  Watchful waiting. You will be monitored closely for any changes in bleeding.  Medicines.  Activity restriction. This may be needed until the bleeding stops.  A medicine called Rh immunoglobulin. This is given if you have an Rh-negative blood type. It prevents Rh sensitization. Follow these instructions  at home:  Stay on bed rest if told to do so by your health care provider.  Do not lift anything that is heavier than 10 lb (4.5 kg), or the limit that you are told by your health care provider.  Track and write down the number of pads you use each day and how soaked (saturated) they are.  Do not use tampons.  Keep all follow-up visits. This is important. Your health care provider may ask you to have follow-up blood tests or ultrasound tests or both. Contact a health care provider if:  You have any vaginal bleeding.  You have a fever. Get help right away if:  You have severe cramps in your stomach, back, abdomen, or pelvis.  You pass large clots or tissue. Save any tissue for your health care provider to look at.  You faint.  You become light-headed or weak. Summary  A subchorionic hematoma is a collection of blood between the outer wall of the embryo (chorion) and the inner wall of the uterus.  This condition can cause vaginal bleeding.  Sometimes you may have no symptoms and the bleeding may only be seen when ultrasound images are taken.  Treatment may include watchful waiting, medicines, or activity restriction.  Keep all follow-up visits. Get help right away if you have severe cramps or heavy vaginal bleeding. This information is not intended to replace advice  given to you by your health care provider. Make sure you discuss any questions you have with your health care provider. Document Revised: 08/11/2020 Document Reviewed: 08/11/2020 Elsevier Patient Education  2021 [employer].

## 2021-04-01 NOTE — MAU Provider Note (Addendum)
Chief Complaint:  Vaginal Bleeding   Event Date/Time   First Provider Initiated Contact with Patient 04/01/21 0405     HPI: riva sesma is a 25 y.o. [id number] at 55w0dho presents to maternity admissions reporting onset of heavy vaginal bleeding after intercourse tonight.  Cramping started soon after.  No history of problems with pregnancy.  . She denies LOF, vaginal itching/burning, urinary symptoms, h/a, dizziness,  diarrhea, constipation or fever/chills.    Vaginal Bleeding The patient's primary symptoms include pelvic pain and vaginal bleeding. The patient's pertinent negatives include no genital itching, genital lesions or genital odor. This is a new problem. The current episode started today. The problem has been unchanged. The pain is severe. She is pregnant. Associated symptoms include abdominal pain, nausea and vomiting. Pertinent negatives include no chills, constipation, diarrhea, dysuria, fever, flank pain or headaches. The vaginal discharge was bloody. The vaginal bleeding is heavier than menses. She has not been passing clots. She has not been passing tissue. The symptoms are aggravated by intercourse. She has tried nothing for the symptoms. She is sexually active. She uses nothing for contraception.    RN Note: Pt reports onset of vaginal bleeding about one hour after intercourse tonight. Cramping started about 45 minutes ago.   Past Medical History: Past Medical History:  Diagnosis Date  . Anal fistula   . Chronic back pain   . Obesity   . Obesity     Past obstetric history: OB History  Gravida Para Term Preterm AB Living  5 0     4 0  SAB IAB Ectopic Multiple Live Births  _0 # Outcome Date GA Lbr Len/2nd Weight Sex Delivery Anes PTL Lv  5 Current           4 IAB 2016          3 IAB           2 SAB           1 Ectopic             Past Surgical History: Past Surgical History:  Procedure Laterality Date  . ADENOIDECTOMY    . TONSILLECTOMY       Family History: Family History  Problem Relation Age of Onset  . Diabetes Mother   . Hypertension Mother   . Hypertension Father   . Diabetes Maternal Grandmother   . Diabetes Paternal Grandmother     Social History: Social History   Tobacco Use  . Smoking status: Never Smoker  . Smokeless tobacco: Never Used  Vaping Use  . Vaping Use: Never used  Substance Use Topics  . Alcohol use: Not Currently    Alcohol/week: 3.0 standard drinks    Types: 3 Standard drinks or equivalent per week  . Drug use: Not Currently    Types: Marijuana    Comment: quit in December 2021    Allergies:  Allergies  Allergen Reactions  . Amoxicillin Anaphylaxis  . Penicillins Anaphylaxis    Has patient had a PCN reaction causing immediate rash, facial/tongue/throat swelling, SOB or lightheadedness with hypotension: Yes Has patient had a PCN reaction causing severe rash involving mucus membranes or skin necrosis: No Has patient had a PCN reaction that required hospitalization No Has patient had a PCN reaction occurring within the last 10 years: No If all of the above answers are "NO", then may proceed with Cephalosporin use.   .Tori Milks[Naproxen Sodium] Other (See  Comments)    Chest pain    Meds:  Medications Prior to Admission  Medication Sig Dispense Refill Last Dose  . ondansetron (ZOFRAN ODT) 4 MG disintegrating tablet Take 1 tablet (4 mg total) by mouth every 6 (six) hours as needed for nausea. 30 tablet 2 04/01/2021 at Unknown time  . prenatal vitamin w/FE, FA (PRENATAL 1 + 1) 27-1 MG TABS tablet Take 1 tablet by mouth daily at 12 noon.   04/01/2021 at Unknown time  . Accu-Chek Softclix Lancets lancets Use as instructed to check blood sugar 4 times daily 100 each 12   . acetaminophen (TYLENOL) 500 MG tablet Take 1 tablet (500 mg total) by mouth every 6 (six) hours as needed. (Patient not taking: Reported on 03/19/2021) 30 tablet 0   . Blood Glucose Monitoring Suppl (ACCU-CHEK GUIDE ME)  w/Device KIT 1 each by Does not apply route 4 (four) times daily. 1 kit 0   . Blood Pressure Monitor MISC For regular home bp monitoring during pregnancy 1 each 0   . Doxylamine-Pyridoxine (DICLEGIS) 10-10 MG TBEC 2 tabs q hs, if sx persist add 1 tab q am on day 3, if sx persist add 1 tab q afternoon on day 4 100 tablet 6   . glucose blood (ACCU-CHEK GUIDE) test strip Use as instructed to check blood sugar 4 times daily 50 each 12     I have reviewed patient's Past Medical Hx, Surgical Hx, Family Hx, Social Hx, medications and allergies.   ROS:  Review of Systems  Constitutional: Negative for chills and fever.  Gastrointestinal: Positive for abdominal pain, nausea and vomiting. Negative for constipation and diarrhea.  Genitourinary: Positive for pelvic pain and vaginal bleeding. Negative for dysuria and flank pain.  Neurological: Negative for headaches.   Other systems negative  Physical Exam   Patient Vitals for the past 24 hrs:  BP Temp Temp src Pulse Resp SpO2  04/01/21 0353 (!) 117/45 -- -- (!) 103 -- --  04/01/21 0348 (!) 138/95 98.7 F (37.1 C) Oral (!) 104 19 100 %   Constitutional: Well-developed, well-nourished female in no acute distress.  Cardiovascular: normal rate and rhythm Respiratory: normal effort, clear to auscultation bilaterally GI: Abd soft, non-tender, gravid appropriate for gestational age.   No rebound or guarding. MS: Extremities nontender, no edema, normal ROM Neurologic: Alert and oriented x 4.  GU: Neg CVAT.  PELVIC EXAM: Cervix pink, visually closed, Moderate red blood in vault.    FHT:  148   Labs:  A/Positive/-- (04/21 1103)  Imaging:  Bedside US done showing moderately large Subchorionic hemorrhage Right ovarian cyst Live fetus  MAU Course/MDM: I have ordered US and reviewed results.  Discussed we don't know why [facility] is there (not there 2 wks ago).  Discussed IC probably initiated the release of blood from the hematoma.  Recommend pelvic  rest and followup in office    Assessment: Single IUP at 60[facility]0dVaginal [facility]: [facility] Message sent to Dr EElonda Huskyto review Pelvic rest Out of work until Monday Follow up in Office for prenatal visits   Encouraged to return if she develops worsening of symptoms, increase in pain, fever, or other concerning symptoms.  Pt stable at time of discharge.  MHansel FeinsteinCNM, MSN Certified Nurse-Midwife 04/01/2021 4:05 AM

## 2021-04-01 NOTE — MAU Note (Signed)
Pt reports onset of vaginal bleeding about one hour after intercourse tonight. Cramping started about 45 minutes ago.

## 2021-04-02 ENCOUNTER — Telehealth: Payer: Self-pay | Admitting: Advanced Practice Midwife

## 2021-04-02 ENCOUNTER — Other Ambulatory Visit: Payer: Self-pay

## 2021-04-02 DIAGNOSIS — O321XX0 Maternal care for breech presentation, not applicable or unspecified: Secondary | ICD-10-CM

## 2021-04-02 DIAGNOSIS — O26892 Other specified pregnancy related conditions, second trimester: Secondary | ICD-10-CM

## 2021-04-02 DIAGNOSIS — Z3A14 14 weeks gestation of pregnancy: Secondary | ICD-10-CM

## 2021-04-02 DIAGNOSIS — R102 Pelvic and perineal pain: Secondary | ICD-10-CM | POA: Diagnosis not present

## 2021-04-02 DIAGNOSIS — O4692 Antepartum hemorrhage, unspecified, second trimester: Secondary | ICD-10-CM

## 2021-04-02 NOTE — Telephone Encounter (Signed)
Pt left a voicemail - states she wants to cancel her lab appt today (is for redraw Panorama)  Wants to speak to a nurse, ? How long will the bleeding last, ? Everything ok?  Please call pt

## 2021-04-02 NOTE — Telephone Encounter (Signed)
Patient states she is still having light bleeding and is wondering how long this can last. Discussed [facility] and that bleeding can linger depending on the size of the [facility].  Encouraged patient to rest when able, push fluids and avoid intercourse.  Denies passing clots, cramping, etc.  Reassured patient and answered all other questions.

## 2021-04-07 ENCOUNTER — Encounter: Payer: Medicaid Other | Attending: Advanced Practice Midwife | Admitting: Registered"

## 2021-04-07 ENCOUNTER — Ambulatory Visit: Payer: Medicaid Other | Admitting: Registered"

## 2021-04-07 ENCOUNTER — Other Ambulatory Visit: Payer: Self-pay

## 2021-04-07 DIAGNOSIS — O24319 Unspecified pre-existing diabetes mellitus in pregnancy, unspecified trimester: Secondary | ICD-10-CM

## 2021-04-07 DIAGNOSIS — O0991 Supervision of high risk pregnancy, unspecified, first trimester: Secondary | ICD-10-CM | POA: Insufficient documentation

## 2021-04-07 DIAGNOSIS — O24311 Unspecified pre-existing diabetes mellitus in pregnancy, first trimester: Secondary | ICD-10-CM | POA: Insufficient documentation

## 2021-04-08 NOTE — Progress Notes (Signed)
Patient was seen on 04/07/21 for self-management of Gestational Diabetes (Pregestational diabetes mellitus, modified White class B)   EDD 09/30/21; [redacted]w[redacted]d A1c 6.7% 03/19/2021  Patient states no history of GDM. Pt states family history of T2DM. Diet history obtained. Patient eats variety of all food groups, likely higher than recommended carbohydrate intake with fruit, pasta, juice, sweetened yogurt. Beverages include ice water (chews on ice), orange juice, occasional soda, sweet tea. Pt states last summer she was experiencing high stress at work, but her stress levels have come down recently. Patient works at nursing home.  The following learning objectives were met by the patient :   States the definition of Gestational Diabetes  States why dietary management is important in controlling blood glucose  Describes the effects of carbohydrates on blood glucose levels  Demonstrates ability to create a balanced meal plan  Demonstrates carbohydrate counting   States when to check blood glucose levels  Demonstrates proper blood glucose monitoring techniques  States the effect of stress and exercise on blood glucose levels  States the importance of limiting caffeine and abstaining from alcohol and smoking  Plan:  Aim for 3 Carbohydrate Choices per meal (45 grams) +/- 1 either way  Aim for 1-2 Carbohydrate Choices per snack Begin reading food labels for Total Carbohydrate of foods If OK with your MD, consider  increasing your activity level by walking, Arm Chair Exercises or other activity daily as tolerated Begin checking Blood Glucose before breakfast and 2 hours after first bite of breakfast, lunch and dinner as directed by MD  Bring Log Book/Sheet and meter to every medical appointment  Take medication if directed by MD  Patient already has a meter, is testing when she feels "off." Two reported readings 99 and 103 mg/dL states she felt sx of low blood sugar.  One reading was the day  before she was admitted for subchoronic Hematoma. Pt states she is being careful not to do too much physical activity  Patient instructed to monitor glucose levels: FBS: 60 - 95 mg/dl 2 hour: <120 mg/dl  Patient received the following handouts:  Nutrition Diabetes and Pregnancy  Carbohydrate Counting List  Blood glucose Log Sheet  Patient will be seen for follow-up in 2-3 weeks or as needed.

## 2021-04-09 ENCOUNTER — Encounter: Payer: Medicaid Other | Admitting: Women's Health

## 2021-04-14 ENCOUNTER — Other Ambulatory Visit: Payer: Self-pay | Admitting: Obstetrics & Gynecology

## 2021-04-14 DIAGNOSIS — O418X20 Other specified disorders of amniotic fluid and membranes, second trimester, not applicable or unspecified: Secondary | ICD-10-CM

## 2021-04-15 ENCOUNTER — Ambulatory Visit (INDEPENDENT_AMBULATORY_CARE_PROVIDER_SITE_OTHER): Payer: Medicaid Other | Admitting: Obstetrics and Gynecology

## 2021-04-15 ENCOUNTER — Other Ambulatory Visit: Payer: Self-pay

## 2021-04-15 ENCOUNTER — Encounter: Payer: Self-pay | Admitting: Obstetrics and Gynecology

## 2021-04-15 ENCOUNTER — Encounter: Payer: Self-pay | Admitting: *Deleted

## 2021-04-15 ENCOUNTER — Other Ambulatory Visit: Payer: Medicaid Other

## 2021-04-15 VITALS — BP 133/75 | HR 106 | Wt 236.4 lb

## 2021-04-15 DIAGNOSIS — O418X20 Other specified disorders of amniotic fluid and membranes, second trimester, not applicable or unspecified: Secondary | ICD-10-CM

## 2021-04-15 DIAGNOSIS — Z1379 Encounter for other screening for genetic and chromosomal anomalies: Secondary | ICD-10-CM

## 2021-04-15 DIAGNOSIS — O24319 Unspecified pre-existing diabetes mellitus in pregnancy, unspecified trimester: Secondary | ICD-10-CM

## 2021-04-15 DIAGNOSIS — O0992 Supervision of high risk pregnancy, unspecified, second trimester: Secondary | ICD-10-CM

## 2021-04-15 DIAGNOSIS — Z1389 Encounter for screening for other disorder: Secondary | ICD-10-CM

## 2021-04-15 DIAGNOSIS — O468X2 Other antepartum hemorrhage, second trimester: Secondary | ICD-10-CM

## 2021-04-15 LAB — POCT URINALYSIS DIPSTICK OB
Ketones, UA: NEGATIVE
Leukocytes, UA: NEGATIVE
Nitrite, UA: POSITIVE

## 2021-04-15 MED ORDER — METFORMIN HCL 500 MG PO TABS: 500.0000 mg | ORAL_TABLET | Freq: Every day | ORAL | 5 refills | Status: DC

## 2021-04-15 NOTE — Progress Notes (Signed)
Subjective:  Isabel Conner is a 25 y.o. G5P0040 at [redacted]w[redacted]d being seen today for ongoing prenatal care.  She is currently monitored for the following issues for this high-risk pregnancy and has Obesity; Chronic back pain; Supervision of high-risk pregnancy; Pregestational diabetes mellitus, modified White class B; and Subchorionic hematoma on their problem list.  Patient reports some vaginal bleeding and constipation..  Contractions: Irritability. Vag. Bleeding: Small.  Movement: Present. Denies leaking of fluid.   The following portions of the patient's history were reviewed and updated as appropriate: allergies, current medications, past family history, past medical history, past social history, past surgical history and problem list. Problem list updated.  Objective:   Vitals:   04/15/21 1036  BP: 133/75  Pulse: (!) 106  Weight: 236 lb 6.4 oz (107.2 kg)    Fetal Status:     Movement: Present     General:  Alert, oriented and cooperative. Patient is in no acute distress.  Skin: Skin is warm and dry. No rash noted.   Cardiovascular: Normal heart rate noted  Respiratory: Normal respiratory effort, no problems with respiration noted  Abdomen: Soft, gravid, appropriate for gestational age. Pain/Pressure: Present     Pelvic:  Cervical exam deferred        Extremities: Normal range of motion.  Edema: None  Mental Status: Normal mood and affect. Normal behavior. Normal judgment and thought content.   Urinalysis:      Assessment and Plan:  Pregnancy: G5P0040 at [redacted]w[redacted]d  1. Genetic screening  - INTEGRATED 2  2. Screening for genitourinary condition  - POC Urinalysis Dipstick OB - Urine Culture  3. High-risk pregnancy in second trimester Stable - POC Urinalysis Dipstick OB  4. Supervision of high risk pregnancy in second trimester Stable  5. Pregestational diabetes mellitus, modified White class B CBG'c slightly elevated Will start Metformin 500 mg with evening meal  6.  Subchorionic hematoma in second trimester, single or unspecified fetus Reviewed with pt FHT's today, reassured pt.  Preterm labor symptoms and general obstetric precautions including but not limited to vaginal bleeding, contractions, leaking of fluid and fetal movement were reviewed in detail with the patient. Please refer to After Visit Summary for other counseling recommendations.  Return in about 4 weeks (around 05/13/2021) for OB visit, face to face, any provider.   Hermina Staggers, MD

## 2021-04-15 NOTE — Patient Instructions (Signed)

## 2021-04-17 LAB — INTEGRATED 2
AFP MoM: 1.79
Alpha-Fetoprotein: 39.4 ng/mL
Crown Rump Length: 60.1 mm
DIA MoM: 0.72
DIA Value: 83.6 pg/mL
Estriol, Unconjugated: 0.99 ng/mL
Gest. Age on Collection Date: 12.3 weeks
Gestational Age: 16.1 weeks
Maternal Age at EDD: 27.3 yr
Nuchal Translucency (NT): 1.8 mm
Nuchal Translucency MoM: 1.34
Number of Fetuses: 1
PAPP-A MoM: 0.31
PAPP-A Value: 149.7 ng/mL
Test Results:: NEGATIVE
Weight: 247 [lb_av]
Weight: 247 [lb_av]
hCG MoM: 1.34
hCG Value: 32.7 IU/mL
uE3 MoM: 1.25

## 2021-04-17 LAB — URINE CULTURE

## 2021-04-22 ENCOUNTER — Other Ambulatory Visit: Payer: Medicaid Other

## 2021-04-23 ENCOUNTER — Other Ambulatory Visit: Payer: Medicaid Other

## 2021-04-23 ENCOUNTER — Other Ambulatory Visit: Payer: Self-pay

## 2021-04-23 ENCOUNTER — Inpatient Hospital Stay (HOSPITAL_COMMUNITY)
Admission: AD | Admit: 2021-04-23 | Discharge: 2021-04-23 | Disposition: A | Discharge status: Home / Self Care | Payer: Medicaid Other | Attending: Obstetrics & Gynecology | Admitting: Obstetrics & Gynecology

## 2021-04-23 DIAGNOSIS — Z3A17 17 weeks gestation of pregnancy: Secondary | ICD-10-CM

## 2021-04-23 DIAGNOSIS — O208 Other hemorrhage in early pregnancy: Secondary | ICD-10-CM | POA: Diagnosis not present

## 2021-04-23 DIAGNOSIS — O4692 Antepartum hemorrhage, unspecified, second trimester: Secondary | ICD-10-CM

## 2021-04-23 DIAGNOSIS — O418X20 Other specified disorders of amniotic fluid and membranes, second trimester, not applicable or unspecified: Secondary | ICD-10-CM

## 2021-04-23 DIAGNOSIS — Z3492 Encounter for supervision of normal pregnancy, unspecified, second trimester: Secondary | ICD-10-CM

## 2021-04-23 DIAGNOSIS — Z711 Person with feared health complaint in whom no diagnosis is made: Secondary | ICD-10-CM | POA: Diagnosis not present

## 2021-04-23 LAB — WET PREP, GENITAL
Sperm: NONE SEEN
Trich, Wet Prep: NONE SEEN
Yeast Wet Prep HPF POC: NONE SEEN

## 2021-04-23 NOTE — MAU Provider Note (Addendum)
Event Date/Time   First Provider Initiated Contact with Patient 04/23/21 1050      S Ms. Isabel Conner is a 25 y.o. G3T5176 patient @ [redacted]w[redacted]d who presents to MAU today with complaint of concerns about diagnoses of subchorionic hemorrhage. Reports off and on bleeding throughout the pregnancy. Bleeding recently stopped 2 days ago and she wants to know if the subchorionic hemorrhage has resolved. She has no bleeding, no pain. She is scheduled for an [place] and an OB visit on 6/20 @ [facility]   O BP 129/69 (BP Location: Right Arm)   Pulse 94   Temp 98.1 F (36.7 C) (Oral)   Resp 18   Ht 5\' 3"  (1.6 m)   Wt 109.9 kg   LMP 12/24/2020 (Exact Date)   SpO2 98%   BMI 42.92 kg/m  Physical Exam  A Medical screening exam complete + fetal heart tones on doppler.   P Discharge from MAU in stable condition GC/trichomonas collected today. Recent swabs showed equivocal result.   Warning signs for worsening condition that would warrant emergency follow-up discussed Patient may return to MAU as needed  Continue pelvic rest   Donnetta Gillin, 12/26/2020, NP 04/23/2021 10:52 AM

## 2021-04-23 NOTE — MAU Note (Signed)
Is recovering from a subchorionic hemorrhage, was dx a few wks ago. Was to have had f/u US- anatomy scan ordered, has not able to schedule yet.  "she told them she would just come here for [place] and to make sure everything was ok with the baby".  Explained to pt that spotting may continue, that hearing the fetal heart reassures [place], that the anatomy scan is typically done at 20wks.  Has occasional pulling on sides, feels it is round ligament.

## 2021-04-23 NOTE — Discharge Instructions (Signed)
Subchorionic Hematoma  A hematoma is a collection of blood outside of the blood vessels. A subchorionic hematoma is a collection of blood between the outer wall of the embryo (chorion) and the inner wall of the uterus. This condition can cause vaginal bleeding. Early small hematomas usually shrink on their own and do not affect your baby or pregnancy. When bleeding starts later in pregnancy, or if the hematoma is larger or occurs in older pregnant women, the condition may be more serious. Larger hematomas increase the chances of miscarriage. This condition also increases the risk of:  Premature separation of the placenta from the uterus.  Premature (preterm) labor.  Stillbirth. What are the causes? The exact cause of this condition is not known. It occurs when blood is trapped between the placenta and the uterine wall because the placenta has separated from the original site of implantation. What increases the risk? You are more likely to develop this condition if:  You were treated with fertility medicines.  You became pregnant through in vitro fertilization (IVF). What are the signs or symptoms? Symptoms of this condition include:  Vaginal spotting or bleeding.  Abdominal pain. This is rare. Sometimes you may have no symptoms and the bleeding may only be seen when ultrasound images are taken (transvaginal ultrasound). How is this diagnosed? This condition is diagnosed based on a physical exam. This includes a pelvic exam. You may also have other tests, including:  Blood tests.  Urine tests.  Ultrasound of the abdomen. How is this treated? Treatment for this condition can vary. Treatment may include:  Watchful waiting. You will be monitored closely for any changes in bleeding.  Medicines.  Activity restriction. This may be needed until the bleeding stops.  A medicine called Rh immunoglobulin. This is given if you have an Rh-negative blood type. It prevents Rh  sensitization. Follow these instructions at home:  Stay on bed rest if told to do so by your health care provider.  Do not lift anything that is heavier than 10 lb (4.5 kg), or the limit that you are told by your health care provider.  Track and write down the number of pads you use each day and how soaked (saturated) they are.  Do not use tampons.  Keep all follow-up visits. This is important. Your health care provider may ask you to have follow-up blood tests or ultrasound tests or both. Contact a health care provider if:  You have any vaginal bleeding.  You have a fever. Get help right away if:  You have severe cramps in your stomach, back, abdomen, or pelvis.  You pass large clots or tissue. Save any tissue for your health care provider to look at.  You faint.  You become light-headed or weak. Summary  A subchorionic hematoma is a collection of blood between the outer wall of the embryo (chorion) and the inner wall of the uterus.  This condition can cause vaginal bleeding.  Sometimes you may have no symptoms and the bleeding may only be seen when ultrasound images are taken.  Treatment may include watchful waiting, medicines, or activity restriction.  Keep all follow-up visits. Get help right away if you have severe cramps or heavy vaginal bleeding. This information is not intended to replace advice given to you by your health care provider. Make sure you discuss any questions you have with your health care provider. Document Revised: 08/11/2020 Document Reviewed: 08/11/2020 Elsevier Patient Education  2021 Elsevier Inc.   

## 2021-04-24 LAB — GC/CHLAMYDIA PROBE AMP (~~LOC~~) NOT AT ARMC
Chlamydia: NEGATIVE
Comment: NEGATIVE
Comment: NORMAL
Neisseria Gonorrhea: NEGATIVE

## 2021-04-29 ENCOUNTER — Encounter: Payer: Medicaid Other | Admitting: Advanced Practice Midwife

## 2021-05-15 ENCOUNTER — Other Ambulatory Visit: Payer: Self-pay | Admitting: Obstetrics and Gynecology

## 2021-05-15 DIAGNOSIS — Z363 Encounter for antenatal screening for malformations: Secondary | ICD-10-CM

## 2021-05-18 ENCOUNTER — Other Ambulatory Visit: Payer: Self-pay

## 2021-05-18 ENCOUNTER — Other Ambulatory Visit (HOSPITAL_COMMUNITY)
Admission: RE | Admit: 2021-05-18 | Discharge: 2021-05-18 | Disposition: A | Discharge status: Home / Self Care | Payer: Medicaid Other | Source: Ambulatory Visit | Attending: Obstetrics & Gynecology | Admitting: Obstetrics & Gynecology

## 2021-05-18 ENCOUNTER — Ambulatory Visit (INDEPENDENT_AMBULATORY_CARE_PROVIDER_SITE_OTHER): Payer: Medicaid Other

## 2021-05-18 ENCOUNTER — Encounter: Payer: Self-pay | Admitting: Women's Health

## 2021-05-18 ENCOUNTER — Ambulatory Visit (INDEPENDENT_AMBULATORY_CARE_PROVIDER_SITE_OTHER): Payer: Medicaid Other | Admitting: Women's Health

## 2021-05-18 VITALS — BP 123/70 | HR 113 | Wt 250.0 lb

## 2021-05-18 DIAGNOSIS — Z3A20 20 weeks gestation of pregnancy: Secondary | ICD-10-CM

## 2021-05-18 DIAGNOSIS — O0992 Supervision of high risk pregnancy, unspecified, second trimester: Secondary | ICD-10-CM | POA: Insufficient documentation

## 2021-05-18 DIAGNOSIS — O24319 Unspecified pre-existing diabetes mellitus in pregnancy, unspecified trimester: Secondary | ICD-10-CM

## 2021-05-18 DIAGNOSIS — Z124 Encounter for screening for malignant neoplasm of cervix: Secondary | ICD-10-CM

## 2021-05-18 DIAGNOSIS — F1290 Cannabis use, unspecified, uncomplicated: Secondary | ICD-10-CM

## 2021-05-18 DIAGNOSIS — Z363 Encounter for antenatal screening for malformations: Secondary | ICD-10-CM

## 2021-05-18 DIAGNOSIS — O24419 Gestational diabetes mellitus in pregnancy, unspecified control: Secondary | ICD-10-CM

## 2021-05-18 DIAGNOSIS — N83201 Unspecified ovarian cyst, right side: Secondary | ICD-10-CM

## 2021-05-18 LAB — POCT URINALYSIS DIPSTICK OB
Blood, UA: NEGATIVE
Leukocytes, UA: NEGATIVE
Nitrite, UA: NEGATIVE
POC,PROTEIN,UA: NEGATIVE

## 2021-05-18 LAB — GLUCOSE, POCT (MANUAL RESULT ENTRY): POC Glucose: 173 mg/dl — AB (ref 70–99)

## 2021-05-18 NOTE — Patient Instructions (Addendum)
Isabel Conner, thank you for choosing our office today! We appreciate the opportunity to meet your healthcare needs. You may receive a short survey by mail, e-mail, or through [employer]. If you are happy with your care we would appreciate if you could take just a few minutes to complete the survey questions. We read all of your comments and take your feedback very seriously. Thank you again for choosing our office.  Center for [employer] Team at [facility] at [facility] ([address] [place], [place] [address]) Entrance C, located off of [address] Free 24/7 valet parking   Start your metformin. Cut out sweets (candy, puddings, sodas, etc) Check blood sugars 4 times a day: in the morning before eating/drinking anything (goal is <95) and 2 hours after your first bite of breakfast, lunch, and supper (goal is <120). Please keep a log (Example 04/27/21: 95, 120, 120, 120) and bring to each appointment.   Go to [email or web address] to register for FREE online childbirth classes  Call the office [phone]) or go to [facility] if: You begin to severe cramping Your water breaks.  Sometimes it is a big gush of fluid, sometimes it is just a trickle that keeps getting your panties wet or running down your legs You have vaginal bleeding.  It is normal to have a small amount of spotting if your cervix was checked.   [facility] Pediatricians/Family Doctors [facility]): [address] Colette Ribas, [phone]           [facility]: [address], [phone]                [facility]): [address], [phone] (call to ask if accepting patients) [facility]: [address], [place], [phone]    [facility] Pediatricians/Family Doctors [facility]): [phone] [address], [phone] [facility]: [address] [place], [phone] [facility]: [address], [phone]  [facility] Doctors  [facility]): [phone] [facility]: [address] [phone]   [facility] Doctors [facility]: [address] [phone]  [facility] Doctors  [facility]: [phone], [phone]  Home Blood Pressure Monitoring for Patients   Your provider has recommended that you check your blood pressure (BP) at least once a week at home. If you do not have a blood pressure cuff at home, one will be provided for you. Contact your provider if you have not received your monitor within 1 week.   Helpful Tips for Accurate Home Blood Pressure Checks  Don't smoke, exercise, or drink caffeine 30 minutes before checking your BP Use the restroom before checking your BP (a full bladder can raise your pressure) Relax in a comfortable upright chair Feet on the ground Left arm resting comfortably on a flat surface at the level of your heart Legs uncrossed Back supported Sit quietly and don't talk Place the cuff on your bare arm Adjust snuggly, so that only two fingertips can fit between your skin and the top of the cuff Check 2 readings separated by at least one minute Keep a log of your BP readings For a visual, please reference this diagram: http://ccnc.care/bpdiagram  Provider Name: [facility]     Phone: [phone]  Zone 1: ALL CLEAR  Continue to monitor your symptoms:  BP reading is  less than 140 (top number) or less than 90 (bottom number)  No right upper stomach pain No headaches or seeing spots No feeling nauseated or throwing up No swelling in face and hands  Zone 2: CAUTION Call your doctor's office for any of the following:  BP reading is greater than 140 (top number) or greater than 90 (bottom number)  Stomach pain under your ribs in the middle or right side Headaches or seeing  spots Feeling nauseated or throwing up Swelling in face and hands  Zone 3: EMERGENCY  Seek immediate medical care if you have any of the following:  BP reading is greater than160 (top number) or greater than 110 (bottom number) Severe headaches not improving with Tylenol Serious difficulty catching your breath Any worsening symptoms from Zone 2     Second Trimester of Pregnancy The second trimester is from week 14 through week 27 (months 4 through 6). The second trimester is often a time when you feel your best. Your body has adjusted to being pregnant, and you begin to feel better physically. Usually, morning sickness has lessened or quit completely, you may have more energy, and you may have an increase in appetite. The second trimester is also a time when the fetus is growing rapidly. At the end of the sixth month, the fetus is about 9 inches long and weighs about 1 pounds. You will likely begin to feel the baby move (quickening) between 16 and 20 weeks of pregnancy. Body changes during your second trimester Your body continues to go through many changes during your second trimester. The changes vary from woman to woman. Your weight will continue to increase. You will notice your lower abdomen bulging out. You may begin to get stretch marks on your hips, abdomen, and breasts. You may develop headaches that can be relieved by medicines. The medicines should be approved by your health care provider. You may urinate more often because the fetus is pressing on your bladder. You may develop or continue to have heartburn as a result of your pregnancy. You may develop constipation because certain hormones are causing the muscles that push waste through your intestines to slow down. You may develop hemorrhoids or swollen, bulging veins (varicose veins). You may have back pain. This is caused by: Weight gain. Pregnancy hormones that are relaxing the joints in your pelvis. A shift in weight and  the muscles that support your balance. Your breasts will continue to grow and they will continue to become tender. Your gums may bleed and may be sensitive to brushing and flossing. Dark spots or blotches (chloasma, mask of pregnancy) may develop on your face. This will likely fade after the baby is born. A dark line from your belly button to the pubic area (linea nigra) may appear. This will likely fade after the baby is born. You may have changes in your hair. These can include thickening of your hair, rapid growth, and changes in texture. Some women also have hair loss during or after pregnancy, or hair that feels dry or thin. Your hair will most likely return to normal after your baby is born.  What to expect at prenatal visits During a routine prenatal visit: You will be weighed to make sure you and the fetus are growing normally. Your blood pressure will be taken. Your abdomen will be measured to track your baby's growth. The fetal heartbeat will be listened to. Any test results from the previous visit will be discussed.  Your health care  provider may ask you: How you are feeling. If you are feeling the baby move. If you have had any abnormal symptoms, such as leaking fluid, bleeding, severe headaches, or abdominal cramping. If you are using any tobacco products, including cigarettes, chewing tobacco, and electronic cigarettes. If you have any questions.  Other tests that may be performed during your second trimester include: Blood tests that check for: Low iron levels (anemia). High blood sugar that affects pregnant women (gestational diabetes) between 62 and 28 weeks. Rh antibodies. This is to check for a protein on red blood cells (Rh factor). Urine tests to check for infections, diabetes, or protein in the urine. An ultrasound to confirm the proper growth and development of the baby. An amniocentesis to check for possible genetic problems. Fetal screens for spina bifida and  Down syndrome. HIV (human immunodeficiency virus) testing. Routine prenatal testing includes screening for HIV, unless you choose not to have this test.  Follow these instructions at home: Medicines Follow your health care provider's instructions regarding medicine use. Specific medicines may be either safe or unsafe to take during pregnancy. Take a prenatal vitamin that contains at least 600 micrograms (mcg) of folic acid. If you develop constipation, try taking a stool softener if your health care provider approves. Eating and drinking Eat a balanced diet that includes fresh fruits and vegetables, whole grains, good sources of protein such as meat, eggs, or tofu, and low-fat dairy. Your health care provider will help you determine the amount of weight gain that is right for you. Avoid raw meat and uncooked cheese. These carry germs that can cause birth defects in the baby. If you have low calcium intake from food, talk to your health care provider about whether you should take a daily calcium supplement. Limit foods that are high in fat and processed sugars, such as fried and sweet foods. To prevent constipation: Drink enough fluid to keep your urine clear or pale yellow. Eat foods that are high in fiber, such as fresh fruits and vegetables, whole grains, and beans. Activity Exercise only as directed by your health care provider. Most women can continue their usual exercise routine during pregnancy. Try to exercise for 30 minutes at least 5 days a week. Stop exercising if you experience uterine contractions. Avoid heavy lifting, wear low heel shoes, and practice good posture. A sexual relationship may be continued unless your health care provider directs you otherwise. Relieving pain and discomfort Wear a good support bra to prevent discomfort from breast tenderness. Take warm sitz baths to soothe any pain or discomfort caused by hemorrhoids. Use hemorrhoid cream if your health care provider  approves. Rest with your legs elevated if you have leg cramps or low back pain. If you develop varicose veins, wear support hose. Elevate your feet for 15 minutes, 3-4 times a day. Limit salt in your diet. Prenatal Care Write down your questions. Take them to your prenatal visits. Keep all your prenatal visits as told by your health care provider. This is important. Safety Wear your seat belt at all times when driving. Make a list of emergency phone numbers, including numbers for family, friends, the hospital, and police and fire departments. General instructions Ask your health care provider for a referral to a local prenatal education class. Begin classes no later than the beginning of month 6 of your pregnancy. Ask for help if you have counseling or nutritional needs during pregnancy. Your health care provider can offer advice or refer you to specialists  for help with various needs. Do not use hot tubs, steam rooms, or saunas. Do not douche or use tampons or scented sanitary pads. Do not cross your legs for long periods of time. Avoid cat litter boxes and soil used by cats. These carry germs that can cause birth defects in the baby and possibly loss of the fetus by miscarriage or stillbirth. Avoid all smoking, herbs, alcohol, and unprescribed drugs. Chemicals in these products can affect the formation and growth of the baby. Do not use any products that contain nicotine or tobacco, such as cigarettes and e-cigarettes. If you need help quitting, ask your health care provider. Visit your dentist if you have not gone yet during your pregnancy. Use a soft toothbrush to brush your teeth and be gentle when you floss. Contact a health care provider if: You have dizziness. You have mild pelvic cramps, pelvic pressure, or nagging pain in the abdominal area. You have persistent nausea, vomiting, or diarrhea. You have a bad smelling vaginal discharge. You have pain when you urinate. Get help right  away if: You have a fever. You are leaking fluid from your vagina. You have spotting or bleeding from your vagina. You have severe abdominal cramping or pain. You have rapid weight gain or weight loss. You have shortness of breath with chest pain. You notice sudden or extreme swelling of your face, hands, ankles, feet, or legs. You have not felt your baby move in over an hour. You have severe headaches that do not go away when you take medicine. You have vision changes. Summary The second trimester is from week 14 through week 27 (months 4 through 6). It is also a time when the fetus is growing rapidly. Your body goes through many changes during pregnancy. The changes vary from woman to woman. Avoid all smoking, herbs, alcohol, and unprescribed drugs. These chemicals affect the formation and growth your baby. Do not use any tobacco products, such as cigarettes, chewing tobacco, and e-cigarettes. If you need help quitting, ask your health care provider. Contact your health care provider if you have any questions. Keep all prenatal visits as told by your health care provider. This is important. This information is not intended to replace advice given to you by your health care provider. Make sure you discuss any questions you have with your health care provider. Document Released: 11/09/2001 Document Revised: 04/22/2016 Document Reviewed: 01/16/2013 Elsevier Interactive Patient Education  2017 [employer]

## 2021-05-18 NOTE — Progress Notes (Signed)
HIGH-RISK PREGNANCY VISIT Patient name: Isabel Conner MRN [id number]  Date of birth: 01-17-1996 Chief Complaint:   High Risk Gestation ([place] & Pap today; feels raw between legs, under breast and under stomach)  History of Present Illness:   Isabel Conner is a 25 y.o. 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    Today she reports  not taking metformin rx'd at 5/18 visit. Didn't see the need to. Had visit w/ dietician 5/10. Not checking sugars as directed. Eating candy: butterfingers and banana pudding before visit today. CBG today 173. 14lb wt gain since last visit. Feels raw/irritated b/w legs, under Rt breast. Known SCH, no bleeding in few weeks.  . Contractions: Not present. Vag. Bleeding: None.  Movement: Present. denies leaking of fluid.   Review of Systems:   Pertinent items are noted in HPI Denies abnormal vaginal discharge w/ itching/odor/irritation, headaches, visual changes, shortness of breath, chest pain, abdominal pain, severe nausea/vomiting, or problems with urination or bowel movements unless otherwise stated above. Pertinent History Reviewed:  Reviewed past medical,surgical, social, obstetrical and family history.  Reviewed problem list, medications and allergies. Physical Assessment:   Vitals:   05/18/21 1443  BP: 123/70  Pulse: (!) 113  Weight: 250 lb (113.4 kg)  Body mass index is 44.29 kg/m.           Physical Examination:   General appearance: alert, well appearing, and in no distress  Mental status: alert, oriented to person, place, and time  Skin: warm & dry   Extremities: Edema: Trace    Cardiovascular: normal heart rate noted  Respiratory: normal respiratory effort, no distress  Abdomen: gravid, soft, non-tender  Pelvic:  thin prep pap obtained, raw demarcated areas bilateral inner thighs (& under  Rt breast) c/w yeast- painted w/ gentian violet          Fetal Status: Fetal Heart Rate (bpm): 144 u/s   Movement: Present    Fetal Surveillance Testing today:  05/20/21 20+5 wks,breech,cx 3.2 cm,amnionic sludge,svp of fluid 6.3 cm,FHR 144 BPM,subchorionic hemorrhage 5 x .6 x 4.9 cm,two simple cysts vs large cyst with a septation adjacent to right ovary 10.9 x 6.4 x 7.9 cm,normal left ovary,efw 491 g 99%,anatomy complete  Chaperone: [place]    Results for orders placed or performed in visit on 05/18/21 (from the past 24 hour(s))  POC Urinalysis Dipstick OB   Collection Time: 05/18/21  2:52 PM  Result Value Ref Range   Color, UA     Clarity, UA     Glucose, UA 4+ (A) Negative   Bilirubin, UA     Ketones, UA 1+    Spec Grav, UA     Blood, UA neg    pH, UA     POC,PROTEIN,UA Negative Negative, Trace, Small (1+), Moderate (2+), Large (3+), 4+   Urobilinogen, UA     Nitrite, UA neg    Leukocytes, UA Negative Negative   Appearance     Odor    POCT glucose (manual entry)   Collection Time: 05/18/21  2:56 PM  Result Value Ref Range   POC Glucose 173 (A) 70 - 99 mg/dl    Assessment & Plan:  High-risk pregnancy: G5P0040 at [redacted]w[redacted]d with an Estimated Date of Delivery: 09/30/21   1) A2/BDM, 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  Has gained 14lbs since last visit. EFW 99% today (normal AFI). Discussed importance of strict glycemic control, checking sugars QID as directed and bringing log to each appt, and adherence to low carb diet during pregnancy as well as potential complications from uncontrolled diabetes during pregnancy (including cardiac anomalies, shoulder dystocia, IUFD, etc). Promises to start changing diet/taking meds/checking sugars as directed. F/U 1wk w/ MD only, bring log. Fetal echo order faxed. Pt to schedule eye exam. Unable to take ASA d/t NSAID allergy. Ordered CMP, TSH, P:C ratio- pt states wants to get bloodwork when she comes back  next week.    2) SCH, stable, 5cm today, no vaginal bleeding  3) Rt ovary w/ 2 simple cysts vs large cyst w/ septation adjacent to Rt ovary 10.9cm> reviewed torsion warning s/s to report. Review of chart after visit reveals this has been present since Sept 2019 (at that time was 2 cysts measuring up to 7.7cm)  4) Skin candida> painted w/ gentian violet  Meds: No orders of the defined types were placed in this encounter.   Labs/procedures today: pap and U/S  Treatment Plan:   fetal echo @ 22+wks (ordered today), Growth u/s q4wks       2x/wk testing or weekly BPP @ 32wks    Deliver @ 39wks:_____   Reviewed: Preterm labor symptoms and general obstetric precautions including but not limited to vaginal bleeding, contractions, leaking of fluid and fetal movement were reviewed in detail with the patient.  All questions were answered. Does have home bp cuff. Office bp cuff given: not applicable. Check bp weekly, let us know if consistently >140 and/or >90.  Follow-up: Return in about 1 week (around 05/25/2021) for HROB, MD only, in person w/ labs.   Future Appointments  Date Time Provider Department Center  05/26/2021  2:30 PM Lazaro Arms, MD [facility]    Orders Placed This Encounter  Procedures   Comprehensive metabolic panel   TSH   Protein / creatinine ratio, urine   POC Urinalysis Dipstick OB   POCT glucose (manual entry)   Cheral Marker CNM, [facility] 05/18/2021 4:43 PM

## 2021-05-18 NOTE — Progress Notes (Signed)
[place] 20+5 wks,breech,cx 3.2 cm,amnionic sludge,svp of fluid 6.3 cm,FHR 144 BPM,subchorionic hemorrhage 5 x .6 x 4.9 cm,two simple cysts vs large cyst with a septation adjacent to right ovary 10.9 x 6.4 x 7.9 cm,normal left ovary,efw 491 g 99%,anatomy complete

## 2021-05-21 LAB — CYTOLOGY - PAP
Comment: NEGATIVE
Diagnosis: NEGATIVE
High risk HPV: NEGATIVE

## 2021-05-26 ENCOUNTER — Ambulatory Visit (INDEPENDENT_AMBULATORY_CARE_PROVIDER_SITE_OTHER): Payer: Medicaid Other | Admitting: Obstetrics & Gynecology

## 2021-05-26 ENCOUNTER — Encounter: Payer: Self-pay | Admitting: Obstetrics & Gynecology

## 2021-05-26 ENCOUNTER — Other Ambulatory Visit: Payer: Self-pay

## 2021-05-26 VITALS — BP 117/67 | HR 109 | Wt 253.4 lb

## 2021-05-26 DIAGNOSIS — Z331 Pregnant state, incidental: Secondary | ICD-10-CM

## 2021-05-26 DIAGNOSIS — Z3A21 21 weeks gestation of pregnancy: Secondary | ICD-10-CM

## 2021-05-26 DIAGNOSIS — O0992 Supervision of high risk pregnancy, unspecified, second trimester: Secondary | ICD-10-CM

## 2021-05-26 DIAGNOSIS — Z1389 Encounter for screening for other disorder: Secondary | ICD-10-CM

## 2021-05-26 LAB — POCT URINALYSIS DIPSTICK OB
Blood, UA: NEGATIVE
Leukocytes, UA: NEGATIVE
Nitrite, UA: NEGATIVE
POC,PROTEIN,UA: NEGATIVE

## 2021-05-26 MED ORDER — METFORMIN HCL 1000 MG PO TABS: 1000.0000 mg | ORAL_TABLET | Freq: Every day | ORAL | 3 refills | Status: DC

## 2021-05-26 NOTE — Progress Notes (Signed)
HIGH-RISK PREGNANCY VISIT Patient name: Isabel Conner MRN [id number]  Date of birth: December 24, 1995 Chief Complaint:   Routine Prenatal Visit (labs)  History of Present Illness:   Isabel Conner is a 25 y.o. 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    Today she reports no complaints. Contractions: Not present.  .  Movement: Present. denies leaking of fluid.   Depression screen [facility] 2/9 03/19/2021  Decreased Interest 2  Down, Depressed, Hopeless 3  PHQ - 2 Score 5  Altered sleeping 2  Tired, decreased energy 2  Change in appetite 2  Feeling bad or failure about yourself  1  Trouble concentrating 0  Moving slowly or fidgety/restless 0  Suicidal thoughts 1  PHQ-9 Score 13     GAD 7 : Generalized Anxiety Score 03/19/2021  Nervous, Anxious, on Edge 0  Control/stop worrying 1  Worry too much - different things 1  Trouble relaxing 0  Restless 0  Easily annoyed or irritable 1  Afraid - awful might happen 1  Total GAD 7 Score 4     Review of Systems:   Pertinent items are noted in HPI Denies abnormal vaginal discharge w/ itching/odor/irritation, headaches, visual changes, shortness of breath, chest pain, abdominal pain, severe nausea/vomiting, or problems with urination or bowel movements unless otherwise stated above. Pertinent History Reviewed:  Reviewed past medical,surgical, social, obstetrical and family history.  Reviewed problem list, medications and allergies. Physical Assessment:   Vitals:   05/26/21 1450  BP: 117/67  Pulse: (!) 109  Weight: 253 lb 6.4 oz (114.9 kg)  Body mass index is 44.89 kg/m.           Physical Examination:   General appearance: alert, well appearing, and in no distress  Mental status: alert, oriented to person, place, and time  Skin: warm & dry   Extremities: Edema: Trace     Cardiovascular: normal heart rate noted  Respiratory: normal respiratory effort, no distress  Abdomen: gravid, soft, non-tender  Pelvic: Cervical exam deferred         Fetal Status:     Movement: Present    Fetal Surveillance Testing today: FHR 160   Chaperone: N/A    Results for orders placed or performed in visit on 05/26/21 (from the past 24 hour(s))  POC Urinalysis Dipstick OB   Collection Time: 05/26/21  3:07 PM  Result Value Ref Range   Color, UA     Clarity, UA     Glucose, UA Moderate (2+) (A) Negative   Bilirubin, UA     Ketones, UA trace    Spec Grav, UA     Blood, UA neg    pH, UA     POC,PROTEIN,UA Negative Negative, Trace, Small (1+), Moderate (2+), Large (3+), 4+   Urobilinogen, UA     Nitrite, UA neg    Leukocytes, UA Negative Negative   Appearance     Odor      Assessment & Plan:  High-risk pregnancy: G5P0040 at [redacted]w[redacted]d with an Estimated Date of Delivery: 09/30/21   1) Class A2/B DM on metformin 500 qhs, sub optimal control increase to 1000 qhs, 2 hr pp are normal,    Meds:  Meds ordered this encounter  Medications   metFORMIN (GLUCOPHAGE) 1000 MG tablet    Sig: Take 1 tablet (1,000 mg total) by mouth at  bedtime.    Dispense:  30 tablet    Refill:  3    Labs/procedures today: none  Treatment Plan:  per protocol  Reviewed: Preterm labor symptoms and general obstetric precautions including but not limited to vaginal bleeding, contractions, leaking of fluid and fetal movement were reviewed in detail with the patient.  All questions were answered. Does have home bp cuff. Office bp cuff given: yes. Check bp weekly, let us know if consistently >140 and/or >90.  Follow-up: Return in about 3 weeks (around 06/16/2021) for HROB.   No future appointments.  Orders Placed This Encounter  Procedures   POC Urinalysis Dipstick OB   Lazaro Arms  05/26/2021 3:36 PM

## 2021-05-27 LAB — COMPREHENSIVE METABOLIC PANEL
ALT: 7 IU/L (ref 0–32)
AST: 15 IU/L (ref 0–40)
Albumin/Globulin Ratio: 1.7 (ref 1.2–2.2)
Albumin: 3.7 g/dL — ABNORMAL LOW (ref 3.9–5.0)
Alkaline Phosphatase: 69 IU/L (ref 44–121)
BUN/Creatinine Ratio: 14 (ref 9–23)
BUN: 8 mg/dL (ref 6–20)
Bilirubin Total: <0.2 mg/dL (ref 0.0–1.2)
CO2: 18 mmol/L — ABNORMAL LOW (ref 20–29)
Calcium: 8.9 mg/dL (ref 8.7–10.2)
Chloride: 104 mmol/L (ref 96–106)
Creatinine, Ser: 0.58 mg/dL (ref 0.57–1.00)
Globulin, Total: 2.2 g/dL (ref 1.5–4.5)
Glucose: 183 mg/dL — ABNORMAL HIGH (ref 65–99)
Potassium: 4.2 mmol/L (ref 3.5–5.2)
Sodium: 137 mmol/L (ref 134–144)
Total Protein: 5.9 g/dL — ABNORMAL LOW (ref 6.0–8.5)
eGFR: 128 mL/min/{1.73_m2} (ref >59)

## 2021-05-27 LAB — PROTEIN / CREATININE RATIO, URINE
Creatinine, Urine: 109 mg/dL
Protein, Ur: 11.8 mg/dL
Protein/Creat Ratio: 108 mg/g creat (ref 0–200)

## 2021-05-27 LAB — TSH: TSH: 1.07 u[IU]/mL (ref 0.450–4.500)

## 2021-06-06 ENCOUNTER — Other Ambulatory Visit: Payer: Self-pay

## 2021-06-06 ENCOUNTER — Inpatient Hospital Stay (HOSPITAL_COMMUNITY)
Admission: AD | Admit: 2021-06-06 | Discharge: 2021-06-06 | Disposition: A | Discharge status: Home / Self Care | Payer: Medicaid Other | Attending: Obstetrics & Gynecology | Admitting: Obstetrics & Gynecology

## 2021-06-06 ENCOUNTER — Encounter (HOSPITAL_COMMUNITY): Payer: Self-pay | Admitting: Obstetrics & Gynecology

## 2021-06-06 DIAGNOSIS — O99891 Other specified diseases and conditions complicating pregnancy: Secondary | ICD-10-CM | POA: Diagnosis not present

## 2021-06-06 DIAGNOSIS — Z3A23 23 weeks gestation of pregnancy: Secondary | ICD-10-CM | POA: Diagnosis not present

## 2021-06-06 DIAGNOSIS — M5432 Sciatica, left side: Secondary | ICD-10-CM | POA: Insufficient documentation

## 2021-06-06 DIAGNOSIS — O26892 Other specified pregnancy related conditions, second trimester: Secondary | ICD-10-CM | POA: Diagnosis not present

## 2021-06-06 MED ORDER — CYCLOBENZAPRINE HCL 7.5 MG PO TABS: 7.5000 mg | ORAL_TABLET | Freq: Three times a day (TID) | ORAL | 0 refills | Status: DC | PRN

## 2021-06-06 MED ORDER — ACETAMINOPHEN 500 MG PO TABS
1000.0000 mg | ORAL_TABLET | Freq: Once | ORAL | Status: AC
  Administered 2021-06-06: 1000 mg via ORAL
  Filled 2021-06-06: qty 2

## 2021-06-06 MED ORDER — CYCLOBENZAPRINE HCL 5 MG PO TABS
10.0000 mg | ORAL_TABLET | Freq: Once | ORAL | Status: AC
  Administered 2021-06-06: 10 mg via ORAL
  Filled 2021-06-06: qty 2

## 2021-06-06 NOTE — MAU Provider Note (Signed)
History     CSN: [id number]  Arrival date and time: 06/06/21 1518   Event Date/Time   First Provider Initiated Contact with Patient 06/06/21 1626      Chief Complaint  Patient presents with   Leg Pain   HPI Isabel Conner is a 25 y.o. M5Y6503 at 51w3dwho presents with hip & leg pain. Symptoms started last night. States she was active yesterday. Pain woke her up at 1 am. Pain in left lower back, left hip, and radiates down the back of her leg & outside of her ankle. Pain worse with walking. Also feels tingling in parts of her leg. Hasn't taken anything for her symptoms. Rates pain 10/10.  Denies abdominal pain, vaginal bleeding, or LOF. Denies injury or fall. Has history of chronic back pain.  Reports good fetal movement.   OB History     Gravida  5   Para  0   Term      Preterm      AB  4   Living  0      SAB  1   IAB  2   Ectopic  1   Multiple      Live Births              Past Medical History:  Diagnosis Date   Anal fistula    Chronic back pain    Obesity     Past Surgical History:  Procedure Laterality Date   ADENOIDECTOMY     TONSILLECTOMY      Family History  Problem Relation Age of Onset   Diabetes Mother    Hypertension Mother    Hypertension Father    Diabetes Maternal Grandmother    Diabetes Paternal Grandmother     Social History   Tobacco Use   Smoking status: Never   Smokeless tobacco: Never  Vaping Use   Vaping Use: Never used  Substance Use Topics   Alcohol use: Not Currently    Alcohol/week: 3.0 standard drinks    Types: 3 Standard drinks or equivalent per week   Drug use: Not Currently    Types: Marijuana    Comment: quit in December 2021    Allergies:  Allergies  Allergen Reactions   Amoxicillin Anaphylaxis   Penicillins Anaphylaxis    Has patient had a PCN reaction causing immediate rash, facial/tongue/throat swelling, SOB or lightheadedness with hypotension: Yes Has patient had a PCN reaction causing  severe rash involving mucus membranes or skin necrosis: No Has patient had a PCN reaction that required hospitalization No Has patient had a PCN reaction occurring within the last 10 years: No If all of the above answers are "NO", then may proceed with Cephalosporin use.    Aleve [Naproxen Sodium] Other (See Comments)    Chest pain    Medications Prior to Admission  Medication Sig Dispense Refill Last Dose   Cetirizine HCl (ZYRTEC PO) Take by mouth.   Past Month   metFORMIN (GLUCOPHAGE) 1000 MG tablet Take 1 tablet (1,000 mg total) by mouth at bedtime. 30 tablet 3 06/05/2021   prenatal vitamin w/FE, FA (PRENATAL 1 + 1) 27-1 MG TABS tablet Take 1 tablet by mouth daily at 12 noon.   06/05/2021   Accu-Chek Softclix Lancets lancets Use as instructed to check blood sugar 4 times daily 100 each 12    acetaminophen (TYLENOL) 500 MG tablet Take 1 tablet (500 mg total) by mouth every 6 (six) hours as needed. 30 tablet 0  Blood Glucose Monitoring Suppl (ACCU-CHEK GUIDE ME) w/Device KIT 1 each by Does not apply route 4 (four) times daily. 1 kit 0    Blood Pressure Monitor MISC For regular home bp monitoring during pregnancy 1 each 0    Doxylamine-Pyridoxine (DICLEGIS) 10-10 MG TBEC 2 tabs q hs, if sx persist add 1 tab q am on day 3, if sx persist add 1 tab q afternoon on day 4 (Patient not taking: No sig reported) 100 tablet 6    glucose blood (ACCU-CHEK GUIDE) test strip Use as instructed to check blood sugar 4 times daily 50 each 12    HM LIDOCAINE PATCH EX Apply topically. (Patient not taking: Reported on 05/26/2021)      ondansetron (ZOFRAN ODT) 4 MG disintegrating tablet Take 1 tablet (4 mg total) by mouth every 6 (six) hours as needed for nausea. (Patient not taking: No sig reported) 30 tablet 2     Review of Systems  Constitutional: Negative.   Gastrointestinal: Negative.   Genitourinary: Negative.   Musculoskeletal:  Positive for back pain.       + leg pain  Physical Exam   Blood pressure  122/65, pulse 89, temperature 97.6 F (36.4 C), temperature source Oral, resp. rate 17, last menstrual period 12/24/2020, SpO2 98 %.  Physical Exam Vitals and nursing note reviewed.  Constitutional:      Appearance: Normal appearance. She is not ill-appearing or toxic-appearing.  HENT:     Head: Normocephalic and atraumatic.  Eyes:     General: No scleral icterus. Pulmonary:     Effort: Pulmonary effort is normal. No respiratory distress.  Musculoskeletal:     Lumbar back: Normal. No swelling or deformity.     Left upper leg: Normal.     Left lower leg: Normal.  Skin:    General: Skin is warm and dry.  Neurological:     Mental Status: She is alert.     Sensory: Sensation is intact.     Motor: No weakness.     Deep Tendon Reflexes:     Reflex Scores:      Patellar reflexes are 2+ on the right side and 2+ on the left side. Psychiatric:        Mood and Affect: Mood normal.        Behavior: Behavior normal.   NST:  Baseline: 140 bpm, Variability: Good {> 6 bpm), Accelerations: Non-reactive but appropriate for gestational age, and Decelerations: Absent  MAU Course  Procedures No results found for this or any previous visit (from the past 24 hour(s)).  MDM Patient presents for back & leg pain. On exam she has normal sensation, strength, and DTRs in bilateral lower extremities.  Pain improved with tylenol & flexeril. Also applied heat to affected area. Will send rx flexeril. Patient to f/u with her ob if symptoms don't improve or if they worsen.   Assessment and Plan   1. Sciatica of left side   2. [redacted] weeks gestation of pregnancy    -Rx flexeril -reviewed reasons to return to MAU -f/u with ob  Jorje Guild 06/06/2021, 6:23 PM

## 2021-06-06 NOTE — MAU Note (Signed)
Pt reports to mau with c/o left leg pain that radiates down to her foot.  Pt reports she was more active yesterday than she normally is and woke up with this pain around 0300.  Pt denies ctx or LOF.

## 2021-06-07 ENCOUNTER — Inpatient Hospital Stay (HOSPITAL_COMMUNITY)
Admission: AD | Admit: 2021-06-07 | Discharge: 2021-06-07 | Disposition: A | Discharge status: Home / Self Care | Payer: Medicaid Other | Attending: Obstetrics & Gynecology | Admitting: Obstetrics & Gynecology

## 2021-06-07 DIAGNOSIS — O26892 Other specified pregnancy related conditions, second trimester: Secondary | ICD-10-CM | POA: Insufficient documentation

## 2021-06-07 DIAGNOSIS — Y92009 Unspecified place in unspecified non-institutional (private) residence as the place of occurrence of the external cause: Secondary | ICD-10-CM | POA: Insufficient documentation

## 2021-06-07 DIAGNOSIS — Z7984 Long term (current) use of oral hypoglycemic drugs: Secondary | ICD-10-CM | POA: Diagnosis not present

## 2021-06-07 DIAGNOSIS — M79606 Pain in leg, unspecified: Secondary | ICD-10-CM | POA: Insufficient documentation

## 2021-06-07 DIAGNOSIS — Z886 Allergy status to analgesic agent status: Secondary | ICD-10-CM | POA: Insufficient documentation

## 2021-06-07 DIAGNOSIS — M5432 Sciatica, left side: Secondary | ICD-10-CM | POA: Diagnosis not present

## 2021-06-07 DIAGNOSIS — Z3A23 23 weeks gestation of pregnancy: Secondary | ICD-10-CM

## 2021-06-07 DIAGNOSIS — Z880 Allergy status to penicillin: Secondary | ICD-10-CM | POA: Diagnosis not present

## 2021-06-07 DIAGNOSIS — O99891 Other specified diseases and conditions complicating pregnancy: Secondary | ICD-10-CM | POA: Diagnosis not present

## 2021-06-07 MED ORDER — ACETAMINOPHEN 500 MG PO TABS: 1000.0000 mg | ORAL_TABLET | Freq: Four times a day (QID) | ORAL | 0 refills | Status: DC | PRN

## 2021-06-07 MED ORDER — LIDOCAINE 5 % EX PTCH
1.0000 | MEDICATED_PATCH | CUTANEOUS | Status: DC
  Administered 2021-06-07: 1 via TRANSDERMAL
  Filled 2021-06-07: qty 1

## 2021-06-07 MED ORDER — CYCLOBENZAPRINE HCL 10 MG PO TABS: 10.0000 mg | ORAL_TABLET | Freq: Three times a day (TID) | ORAL | 0 refills | Status: DC | PRN

## 2021-06-07 MED ORDER — LIDOCAINE 5 % EX PTCH: 1.0000 | MEDICATED_PATCH | CUTANEOUS | 0 refills | Status: DC

## 2021-06-07 NOTE — MAU Note (Addendum)
Pt here c/o of left lower back pain radiating to left leg. Pt was evaluated about 12 hours ago and prescribed flexiril without relief. Pt stated she took last dose at 7pm. Pt also stated she did not take another dose because she had no relief from first dose.  Denies any pregnancy complaints.

## 2021-06-07 NOTE — Progress Notes (Signed)
Pt to wait in triage room for assessment/evaluation per Bevelyn Ngo., cnm

## 2021-06-07 NOTE — MAU Provider Note (Signed)
Patient Isabel Conner is a 25 y.o. M1D6222  At 89w4dhere with back and leg pain that started about 24 hours ago. She reports that she had this "years ago". She was treated with "nerve medicine" and Flexeril.   Reports that she was cleaning the carpet and noticed it got worse. She was seen in MAU yesterday for same complaint, was given flexeril and tylenol and reported improvement in pain. Last time she took medicine was at 7 pm (9 hours ago)   History     CSN: [id number] Arrival date and time: 06/07/21 0418   None     Chief Complaint  Patient presents with   Leg Pain   Leg Pain  The incident occurred 12 to 24 hours ago. The incident occurred at home. There was no injury mechanism. The pain is present in the left leg. The quality of the pain is described as shooting and stabbing. The pain is at a severity of 10/10. Associated symptoms include a loss of motion, numbness and tingling.   OB History     Gravida  5   Para  0   Term      Preterm      AB  4   Living  0      SAB  1   IAB  2   Ectopic  1   Multiple      Live Births              Past Medical History:  Diagnosis Date   Anal fistula    Chronic back pain    Obesity     Past Surgical History:  Procedure Laterality Date   ADENOIDECTOMY     TONSILLECTOMY      Family History  Problem Relation Age of Onset   Diabetes Mother    Hypertension Mother    Hypertension Father    Diabetes Maternal Grandmother    Diabetes Paternal Grandmother     Social History   Tobacco Use   Smoking status: Never   Smokeless tobacco: Never  Vaping Use   Vaping Use: Never used  Substance Use Topics   Alcohol use: Not Currently    Alcohol/week: 3.0 standard drinks    Types: 3 Standard drinks or equivalent per week   Drug use: Not Currently    Types: Marijuana    Comment: quit in December 2021    Allergies:  Allergies  Allergen Reactions   Amoxicillin Anaphylaxis   Penicillins Anaphylaxis    Has  patient had a PCN reaction causing immediate rash, facial/tongue/throat swelling, SOB or lightheadedness with hypotension: Yes Has patient had a PCN reaction causing severe rash involving mucus membranes or skin necrosis: No Has patient had a PCN reaction that required hospitalization No Has patient had a PCN reaction occurring within the last 10 years: No If all of the above answers are "NO", then may proceed with Cephalosporin use.    Aleve [Naproxen Sodium] Other (See Comments)    Chest pain    Medications Prior to Admission  Medication Sig Dispense Refill Last Dose   Accu-Chek Softclix Lancets lancets Use as instructed to check blood sugar 4 times daily 100 each 12    acetaminophen (TYLENOL) 500 MG tablet Take 1 tablet (500 mg total) by mouth every 6 (six) hours as needed. 30 tablet 0    Blood Glucose Monitoring Suppl (ACCU-CHEK GUIDE ME) w/Device KIT 1 each by Does not apply route 4 (four) times daily. 1 kit 0  Blood Pressure Monitor MISC For regular home bp monitoring during pregnancy 1 each 0    Cetirizine HCl (ZYRTEC PO) Take by mouth.      cyclobenzaprine (FEXMID) 7.5 MG tablet Take 1 tablet (7.5 mg total) by mouth 3 (three) times daily as needed for muscle spasms. 20 tablet 0    glucose blood (ACCU-CHEK GUIDE) test strip Use as instructed to check blood sugar 4 times daily 50 each 12    metFORMIN (GLUCOPHAGE) 1000 MG tablet Take 1 tablet (1,000 mg total) by mouth at bedtime. 30 tablet 3    prenatal vitamin w/FE, FA (PRENATAL 1 + 1) 27-1 MG TABS tablet Take 1 tablet by mouth daily at 12 noon.       Review of Systems  Constitutional: Negative.   HENT: Negative.    Respiratory: Negative.    Gastrointestinal: Negative.   Genitourinary:  Negative for dyspareunia, vaginal bleeding and vaginal discharge.  Neurological:  Positive for tingling and numbness.  Physical Exam   Blood pressure 115/67, pulse 97, temperature 98 F (36.7 C), temperature source Oral, resp. rate 20, last  menstrual period 12/24/2020, SpO2 97 %.  Physical Exam Constitutional:      Appearance: Normal appearance.  Cardiovascular:     Rate and Rhythm: Normal rate.  Pulmonary:     Effort: Pulmonary effort is normal.  Musculoskeletal:        General: Normal range of motion.  Skin:    General: Skin is warm and dry.  Neurological:     Mental Status: She is alert.    MAU Course  Procedures  MDM FHR by Doppler is 152 Patient appears very uncomfortable in MAU; she is requesting "shot of fentanyl because that helped for 3 days last time". Explained to patient that fentanyl is short-acting and we would not recommend in this situation; offered Flexeril which patient has at home, patient declined Will try Lidocaine patch instead  Assessment and Plan   1. Sciatica of left side   -will place referral for PT -discusssed activity restrictions and body mechanics for sciatica, encouraged water immersion (patient has access to pool) -Explained that body mechanics of pregnancy and weight gain in abdomen are contributing to this problem -patient has maternity support belt but does not use it because "baby doesn't like it" -continue with Tylenol and Flexeril -RX for Lidocaine patch given -patient unhappy that she cannot receive fentanly injection but will try to other methods/PT at home [place] 06/07/2021, 5:36 AM

## 2021-06-08 ENCOUNTER — Other Ambulatory Visit: Payer: Self-pay | Admitting: Student

## 2021-06-08 DIAGNOSIS — M5442 Lumbago with sciatica, left side: Secondary | ICD-10-CM

## 2021-06-16 ENCOUNTER — Ambulatory Visit (INDEPENDENT_AMBULATORY_CARE_PROVIDER_SITE_OTHER): Payer: Medicaid Other | Admitting: Obstetrics & Gynecology

## 2021-06-16 ENCOUNTER — Encounter: Payer: Self-pay | Admitting: Obstetrics & Gynecology

## 2021-06-16 ENCOUNTER — Other Ambulatory Visit: Payer: Self-pay

## 2021-06-16 VITALS — BP 117/72 | HR 104 | Wt 256.0 lb

## 2021-06-16 DIAGNOSIS — O24419 Gestational diabetes mellitus in pregnancy, unspecified control: Secondary | ICD-10-CM

## 2021-06-16 DIAGNOSIS — Z3A24 24 weeks gestation of pregnancy: Secondary | ICD-10-CM

## 2021-06-16 DIAGNOSIS — O0992 Supervision of high risk pregnancy, unspecified, second trimester: Secondary | ICD-10-CM

## 2021-06-16 NOTE — Progress Notes (Signed)
HIGH-RISK PREGNANCY VISIT Patient name: Isabel Conner MRN [id number]  Date of birth: 1996/09/04 Chief Complaint:   Routine Prenatal Visit  History of Present Illness:   Isabel Conner is a 25 y.o. 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    Today she reports no complaints. Contractions: Not present. Vag. Bleeding: None.  Movement: Present. denies leaking of fluid.   Depression screen [facility] 2/9 03/19/2021  Decreased Interest 2  Down, Depressed, Hopeless 3  PHQ - 2 Score 5  Altered sleeping 2  Tired, decreased energy 2  Change in appetite 2  Feeling bad or failure about yourself  1  Trouble concentrating 0  Moving slowly or fidgety/restless 0  Suicidal thoughts 1  PHQ-9 Score 13     GAD 7 : Generalized Anxiety Score 03/19/2021  Nervous, Anxious, on Edge 0  Control/stop worrying 1  Worry too much - different things 1  Trouble relaxing 0  Restless 0  Easily annoyed or irritable 1  Afraid - awful might happen 1  Total GAD 7 Score 4     Review of Systems:   Pertinent items are noted in HPI Denies abnormal vaginal discharge w/ itching/odor/irritation, headaches, visual changes, shortness of breath, chest pain, abdominal pain, severe nausea/vomiting, or problems with urination or bowel movements unless otherwise stated above. Pertinent History Reviewed:  Reviewed past medical,surgical, social, obstetrical and family history.  Reviewed problem list, medications and allergies. Physical Assessment:   Vitals:   06/16/21 1343  BP: 117/72  Pulse: (!) 104  Weight: 256 lb (116.1 kg)  Body mass index is 45.35 kg/m.           Physical Examination:   General appearance: alert, well appearing, and in no distress  Mental status: alert, oriented to person, place, and time  Skin: warm & dry   Extremities: Edema: Trace     Cardiovascular: normal heart rate noted  Respiratory: normal respiratory effort, no distress  Abdomen: gravid, soft, non-tender  Pelvic: Cervical exam deferred         Fetal Status: Fetal Heart Rate (bpm): 140 Fundal Height: 30 cm Movement: Present    Fetal Surveillance Testing today:    Chaperone: N/A    No results found for this or any previous visit (from the past 24 hour(s)).  Assessment & Plan:  High-risk pregnancy: G5P0040 at [redacted]w[redacted]d with an Estimated Date of Delivery: 09/30/21   1) A2?B DM, did not kerrp her ECHO appt last week, she is calling to reschedule, she did not bring in her log, she states her CBG <90 in am fasting <120 for 2 hours pp, she is told to bring log each time    Meds: No orders of the defined types were placed in this encounter.   Labs/procedures today: none  Treatment Plan:  R/S echo, PN2 minus glucola  Reviewed: Preterm labor symptoms and general obstetric precautions including but not limited to vaginal bleeding, contractions, leaking of fluid and fetal movement were reviewed in detail with the patient.  All questions were answered. Does have home bp cuff. Office bp cuff given: not applicable. Check bp weekly, let us know if consistently >140 and/or >90.  Follow-up: Return in about 3 weeks (around 07/07/2021) for PN2, minus glucola, HROB.   No future appointments.  No orders of the defined types were placed in this encounter.  Lazaro Arms 06/16/2021 2:28 PM

## 2021-07-01 ENCOUNTER — Inpatient Hospital Stay (HOSPITAL_COMMUNITY)
Admission: AD | Admit: 2021-07-01 | Discharge: 2021-07-01 | Disposition: A | Discharge status: Home / Self Care | Payer: Medicaid Other | Attending: Family Medicine | Admitting: Family Medicine

## 2021-07-01 ENCOUNTER — Encounter (HOSPITAL_COMMUNITY): Payer: Self-pay | Admitting: Family Medicine

## 2021-07-01 DIAGNOSIS — O4702 False labor before 37 completed weeks of gestation, second trimester: Secondary | ICD-10-CM | POA: Diagnosis not present

## 2021-07-01 DIAGNOSIS — O24419 Gestational diabetes mellitus in pregnancy, unspecified control: Secondary | ICD-10-CM

## 2021-07-01 DIAGNOSIS — O26892 Other specified pregnancy related conditions, second trimester: Secondary | ICD-10-CM | POA: Diagnosis present

## 2021-07-01 DIAGNOSIS — Z79899 Other long term (current) drug therapy: Secondary | ICD-10-CM | POA: Diagnosis not present

## 2021-07-01 DIAGNOSIS — O4700 False labor before 37 completed weeks of gestation, unspecified trimester: Secondary | ICD-10-CM

## 2021-07-01 DIAGNOSIS — O0992 Supervision of high risk pregnancy, unspecified, second trimester: Secondary | ICD-10-CM

## 2021-07-01 DIAGNOSIS — Z3A27 27 weeks gestation of pregnancy: Secondary | ICD-10-CM | POA: Insufficient documentation

## 2021-07-01 DIAGNOSIS — R109 Unspecified abdominal pain: Secondary | ICD-10-CM | POA: Insufficient documentation

## 2021-07-01 DIAGNOSIS — O418X20 Other specified disorders of amniotic fluid and membranes, second trimester, not applicable or unspecified: Secondary | ICD-10-CM

## 2021-07-01 HISTORY — DX: Gestational diabetes mellitus in pregnancy, unspecified control: O24.419

## 2021-07-01 LAB — URINALYSIS, ROUTINE W REFLEX MICROSCOPIC
Bilirubin Urine: NEGATIVE
Glucose, UA: NEGATIVE mg/dL
Hgb urine dipstick: NEGATIVE
Ketones, ur: 5 mg/dL — AB
Leukocytes,Ua: NEGATIVE
Nitrite: NEGATIVE
Protein, ur: NEGATIVE mg/dL
Specific Gravity, Urine: 1.021 (ref 1.005–1.030)
pH: 5 (ref 5.0–8.0)

## 2021-07-01 MED ORDER — NIFEDIPINE 10 MG PO CAPS
10.0000 mg | ORAL_CAPSULE | Freq: Once | ORAL | Status: AC
  Administered 2021-07-01: 10 mg via ORAL
  Filled 2021-07-01: qty 1

## 2021-07-01 MED ORDER — LACTATED RINGERS IV SOLN: Freq: Once | INTRAVENOUS | Status: AC

## 2021-07-01 NOTE — MAU Note (Addendum)
Having mid abd cramping since 1600. Had BM and thought it would help but cramping continued. Pain has gotten somewhat better while waiting in lobby but conts to have some cramping. Denies VB or vag d/c

## 2021-07-01 NOTE — MAU Provider Note (Signed)
Chief Complaint:  Abdominal Pain   Event Date/Time   First Provider Initiated Contact with Patient 07/01/21 2006     HPI: Isabel Conner is a 25 y.o. F1T0211 at 82w0dho presents to maternity admissions reporting abdominal cramping since this afternoon.  States feels like "I have to go to the bathroom and I did but it didn't help". Reports intercourse last night. . She reports good fetal movement, denies LOF, vaginal bleeding, vaginal itching/burning, urinary symptoms, h/a, dizziness, n/v, diarrhea, constipation or fever/chills.   Abdominal Pain This is a new problem. The current episode started today. The problem occurs intermittently. The problem has been unchanged. The quality of the pain is cramping. The abdominal pain does not radiate. Pertinent negatives include no constipation, diarrhea, dysuria, fever, frequency, myalgias, nausea or vomiting. Nothing aggravates the pain. The pain is relieved by Nothing. She has tried nothing for the symptoms.   RN Note: Having mid abd cramping since 1600. Had BM and thought it would help but cramping continued. Pain has gotten somewhat better while waiting in lobby but conts to have some cramping. Denies VB or vag d/c  Past Medical History: Past Medical History:  Diagnosis Date   Anal fistula    Chronic back pain    Gestational diabetes    Obesity     Past obstetric history: OB History  Gravida Para Term Preterm AB Living  5 0     4 0  SAB IAB Ectopic Multiple Live Births  _0 # Outcome Date GA Lbr Len/2nd Weight Sex Delivery Anes PTL Lv  5 Current           4 IAB 2016          3 IAB           2 SAB           1 Ectopic             Past Surgical History: Past Surgical History:  Procedure Laterality Date   ADENOIDECTOMY     TONSILLECTOMY      Family History: Family History  Problem Relation Age of Onset   Diabetes Mother    Hypertension Mother    Hypertension Father    Diabetes Maternal Grandmother    Diabetes Paternal  Grandmother     Social History: Social History   Tobacco Use   Smoking status: Never   Smokeless tobacco: Never  Vaping Use   Vaping Use: Never used  Substance Use Topics   Alcohol use: Not Currently    Alcohol/week: 3.0 standard drinks    Types: 3 Standard drinks or equivalent per week   Drug use: Not Currently    Types: Marijuana    Comment: quit in December 2021    Allergies:  Allergies  Allergen Reactions   Amoxicillin Anaphylaxis   Penicillins Anaphylaxis    Has patient had a PCN reaction causing immediate rash, facial/tongue/throat swelling, SOB or lightheadedness with hypotension: Yes Has patient had a PCN reaction causing severe rash involving mucus membranes or skin necrosis: No Has patient had a PCN reaction that required hospitalization No Has patient had a PCN reaction occurring within the last 10 years: No If all of the above answers are "NO", then may proceed with Cephalosporin use.    Aleve [Naproxen Sodium] Other (See Comments)    Chest pain    Meds:  Medications Prior to Admission  Medication Sig Dispense Refill Last Dose  acetaminophen (TYLENOL) 500 MG tablet Take 2 tablets (1,000 mg total) by mouth every 6 (six) hours as needed. 60 tablet 0 Past Week   glucose blood (ACCU-CHEK GUIDE) test strip Use as instructed to check blood sugar 4 times daily 50 each 12 07/01/2021   metFORMIN (GLUCOPHAGE) 1000 MG tablet Take 1 tablet (1,000 mg total) by mouth at bedtime. 30 tablet 3 07/01/2021   prenatal vitamin w/FE, FA (PRENATAL 1 + 1) 27-1 MG TABS tablet Take 1 tablet by mouth daily at 12 noon.   Past Week   Accu-Chek Softclix Lancets lancets Use as instructed to check blood sugar 4 times daily 100 each 12    Blood Glucose Monitoring Suppl (ACCU-CHEK GUIDE ME) w/Device KIT 1 each by Does not apply route 4 (four) times daily. 1 kit 0    Blood Pressure Monitor MISC For regular home bp monitoring during pregnancy 1 each 0    Cetirizine HCl (ZYRTEC PO) Take by mouth.  (Patient not taking: Reported on 06/16/2021)      cyclobenzaprine (FLEXERIL) 10 MG tablet Take 1 tablet (10 mg total) by mouth 3 (three) times daily as needed for muscle spasms. 60 tablet 0    lidocaine (LIDODERM) 5 % Place 1 patch onto the skin daily. Remove & Discard patch within 12 hours or as directed by MD (Patient not taking: Reported on 06/16/2021) 30 patch 0     I have reviewed patient's Past Medical Hx, Surgical Hx, Family Hx, Social Hx, medications and allergies.   ROS:  Review of Systems  Constitutional:  Negative for fever.  Gastrointestinal:  Positive for abdominal pain. Negative for constipation, diarrhea, nausea and vomiting.  Genitourinary:  Negative for dysuria and frequency.  Musculoskeletal:  Negative for myalgias.  Other systems negative  Physical Exam  Patient Vitals for the past 24 hrs:  BP Temp Pulse Resp Height Weight  07/01/21 1929 120/69 -- 95 -- -- --  07/01/21 1925 -- 98.1 F (36.7 C) -- 20 -- --  07/01/21 1924 -- -- -- -- _0  (1.6 m) 115.2 kg   Constitutional: Well-developed, well-nourished female in no acute distress.  Cardiovascular: normal rate and rhythm Respiratory: normal effort, clear to auscultation bilaterally GI: Abd soft, non-tender, gravid appropriate for gestational age.   No rebound or guarding. MS: Extremities nontender, no edema, normal ROM Neurologic: Alert and oriented x 4.  GU: Neg CVAT.  PELVIC EXAM:  Dilation: Closed Effacement (%): 0 Station: Ballotable Exam by:: Carmelia Roller CNM No change with recheck  FHT:  Baseline 135 , moderate variability, accelerations present, no decelerations Contractions: Uterine irritability   Labs: Results for orders placed or performed during the hospital encounter of 07/01/21 (from the past 24 hour(s))  Urinalysis, Routine w reflex microscopic Urine, Clean Catch     Status: Abnormal   Collection Time: 07/01/21  7:32 PM  Result Value Ref Range   Color, Urine YELLOW YELLOW   APPearance HAZY  (A) CLEAR   Specific Gravity, Urine 1.021 1.005 - 1.030   pH 5.0 5.0 - 8.0   Glucose, UA NEGATIVE NEGATIVE mg/dL   Hgb urine dipstick NEGATIVE NEGATIVE   Bilirubin Urine NEGATIVE NEGATIVE   Ketones, ur 5 (A) NEGATIVE mg/dL   Protein, ur NEGATIVE NEGATIVE mg/dL   Nitrite NEGATIVE NEGATIVE   Leukocytes,Ua NEGATIVE NEGATIVE    A/Positive/-- (04/21 1103)  Imaging:  No results found.  MAU Course/MDM: I have ordered labs and reviewed results. UA is clear, no sign of infection NST reviewed, reactive  Treatments in MAU included LR infusion x 2 liters, Procardia x 1 dose.  After these interventions, patient states she no longer feels any cramping.    Assessment: Single IUP at 17w1dUterine cramping with no change in cervix  Plan: Discharge home Preterm  Labor precautions and fetal kick counts Follow up in Office for prenatal visits and recheck of cervix Encouraged to return if she develops worsening of symptoms, increase in pain, fever, or other concerning symptoms.  Pt stable at time of discharge.  MHansel FeinsteinCNM, MSN Certified Nurse-Midwife 07/01/2021 8:07 PM

## 2021-07-07 ENCOUNTER — Encounter: Payer: Self-pay | Admitting: Obstetrics & Gynecology

## 2021-07-07 ENCOUNTER — Other Ambulatory Visit: Payer: Self-pay

## 2021-07-07 ENCOUNTER — Ambulatory Visit (INDEPENDENT_AMBULATORY_CARE_PROVIDER_SITE_OTHER): Payer: Medicaid Other | Admitting: Obstetrics & Gynecology

## 2021-07-07 VITALS — BP 130/81 | HR 103 | Wt 255.0 lb

## 2021-07-07 DIAGNOSIS — O09899 Supervision of other high risk pregnancies, unspecified trimester: Secondary | ICD-10-CM

## 2021-07-07 DIAGNOSIS — O24419 Gestational diabetes mellitus in pregnancy, unspecified control: Secondary | ICD-10-CM

## 2021-07-07 DIAGNOSIS — Z3A27 27 weeks gestation of pregnancy: Secondary | ICD-10-CM

## 2021-07-07 NOTE — Progress Notes (Signed)
HIGH-RISK PREGNANCY VISIT Patient name: Isabel Conner MRN [id number]  Date of birth: [birth year] Chief Complaint:   Routine Prenatal Visit (PN2)  History of Present Illness:   ESSENSE BOUSQUET is a 25 y.o. C1K4818 female at [redacted]w[redacted]d with an Estimated Date of Delivery: 09/30/21 being seen today for ongoing management of a high-risk pregnancy complicated by Class A2 DM.    Today she reports no complaints. Contractions: Irritability. Vag. Bleeding: None.  Movement: Present. denies leaking of fluid.   Depression screen [facility] 2/9 03/19/2021  Decreased Interest 2  Down, Depressed, Hopeless 3  PHQ - 2 Score 5  Altered sleeping 2  Tired, decreased energy 2  Change in appetite 2  Feeling bad or failure about yourself  1  Trouble concentrating 0  Moving slowly or fidgety/restless 0  Suicidal thoughts 1  PHQ-9 Score 13     GAD 7 : Generalized Anxiety Score 03/19/2021  Nervous, Anxious, on Edge 0  Control/stop worrying 1  Worry too much - different things 1  Trouble relaxing 0  Restless 0  Easily annoyed or irritable 1  Afraid - awful might happen 1  Total GAD 7 Score 4     Review of Systems:   Pertinent items are noted in HPI Denies abnormal vaginal discharge w/ itching/odor/irritation, headaches, visual changes, shortness of breath, chest pain, abdominal pain, severe nausea/vomiting, or problems with urination or bowel movements unless otherwise stated above. Pertinent History Reviewed:  Reviewed past medical,surgical, social, obstetrical and family history.  Reviewed problem list, medications and allergies. Physical Assessment:   Vitals:   07/07/21 1426 07/07/21 1431  BP: 140/85 130/81  Pulse: (!) 102 (!) 103  Weight:  255 lb (115.7 kg)  Body mass index is 45.17 kg/m.           Physical Examination:   General appearance: alert, well appearing, and in no distress  Mental status: alert, oriented to person, place, and time  Skin: warm & dry   Extremities: Edema: Trace     Cardiovascular: normal heart rate noted  Respiratory: normal respiratory effort, no distress  Abdomen: gravid, soft, non-tender  Pelvic: Cervical exam deferred         Fetal Status:     Movement: Present    Fetal Surveillance Testing today: FHR 150   Chaperone: N/A    No results found for this or any previous visit (from the past 24 hour(s)).  Assessment & Plan:  High-risk pregnancy: G5P0040 at [redacted]w[redacted]d with an Estimated Date of Delivery: 09/30/21   1) Class A2 DM, sub optimal,  having middle of the night snacks     Meds: No orders of the defined types were placed in this encounter.   Labs/procedures today: labs  Treatment Plan:  have a bedtime snack but no middle night of the snack to better control her blood sugars  Reviewed: Preterm labor symptoms and general obstetric precautions including but not limited to vaginal bleeding, contractions, leaking of fluid and fetal movement were reviewed in detail with the patient.  All questions were answered. Does have home bp cuff. Office bp cuff given: not applicable. Check bp weekly, let us know if consistently >140 and/or >90.  Follow-up: No follow-ups on file.   Future Appointments  Date Time Provider Department Center  08/20/2021  1:00 PM Theressa Millard, [place] [facility]  08/27/2021  4:00 PM Theressa Millard, PT [facility]  09/03/2021  1:00 PM Theressa Millard, PT [facility]  09/17/2021  1:00 PM  Theressa Millard, PT [facility]    Orders Placed This Encounter  Procedures   CBC   RPR   HIV Antibody (routine testing w rflx)   Antibody screen   Lazaro Arms  07/07/2021 2:40 PM

## 2021-07-21 ENCOUNTER — Encounter: Payer: Medicaid Other | Admitting: Obstetrics & Gynecology

## 2021-07-22 ENCOUNTER — Other Ambulatory Visit: Payer: Self-pay

## 2021-07-22 ENCOUNTER — Encounter: Payer: Self-pay | Admitting: Advanced Practice Midwife

## 2021-07-22 ENCOUNTER — Ambulatory Visit (INDEPENDENT_AMBULATORY_CARE_PROVIDER_SITE_OTHER): Payer: Medicaid Other | Admitting: Advanced Practice Midwife

## 2021-07-22 VITALS — BP 129/80 | HR 95 | Wt 262.0 lb

## 2021-07-22 DIAGNOSIS — O0993 Supervision of high risk pregnancy, unspecified, third trimester: Secondary | ICD-10-CM | POA: Diagnosis not present

## 2021-07-22 DIAGNOSIS — Z3A30 30 weeks gestation of pregnancy: Secondary | ICD-10-CM

## 2021-07-22 DIAGNOSIS — Z23 Encounter for immunization: Secondary | ICD-10-CM | POA: Diagnosis not present

## 2021-07-22 NOTE — Progress Notes (Signed)
HIGH-RISK PREGNANCY VISIT Patient name: Isabel Conner MRN [id number]  Date of birth: June 09, 1996 Chief Complaint:   Routine Prenatal Visit (Pain left side)  History of Present Illness:   Isabel Conner is a 25 y.o. G4W1027 female at [redacted]w[redacted]d with an Estimated Date of Delivery: 09/30/21 being seen today for ongoing management of a high-risk pregnancy complicated by A2DM currently on Metformin 1gm q hs.  Didn't bring log,.  Today she reports some left sided (upper abdomen/side) pain, particular w/movement.  Feels better with deep palpation/support. Didn't bring BS log, states FBS is as high as 110s.  Drink sweet tea.  Discussed how strict BS control during pregnancy is very important, and that uncontrolled BS could lead to fetal problems, even fetal death. Verbalized understanding. Contractions: Not present. Vag. Bleeding: None.  Movement: Present. denies leaking of fluid.  Review of Systems:   Pertinent items are noted in HPI Denies abnormal vaginal discharge w/ itching/odor/irritation, headaches, visual changes, shortness of breath, chest pain, abdominal pain, severe nausea/vomiting, or problems with urination or bowel movements unless otherwise stated above. Pertinent History Reviewed:  Reviewed past medical,surgical, social, obstetrical and family history.  Reviewed problem list, medications and allergies. Physical Assessment:   Vitals:   07/22/21 1033  BP: 129/80  Pulse: 95  Weight: 262 lb (118.8 kg)  Body mass index is 46.41 kg/m.           Physical Examination:   General appearance: alert, well appearing, and in no distress  Mental status: alert, oriented to person, place, and time  Skin: warm & dry   Extremities: Edema: None    Cardiovascular: normal heart rate noted  Respiratory: normal respiratory effort, no distress  Abdomen: gravid, soft, non-tender  Pelvic: Cervical exam deferred         Fetal Status: Fetal Heart Rate (bpm): 148   Movement: Present    Fetal Surveillance  Testing today: doppler   Results for orders placed or performed in visit on 07/07/21 (from the past 24 hour(s))  CBC   Collection Time: 07/22/21 12:02 PM  Result Value Ref Range   WBC 16.4 (H) 3.4 - 10.8 x10E3/uL   RBC 4.41 3.77 - 5.28 x10E6/uL   Hemoglobin 12.2 11.1 - 15.9 g/dL   Hematocrit 25.3 66.4 - 46.6 %   MCV 84 79 - 97 fL   MCH 27.7 26.6 - 33.0 pg   MCHC 32.8 31.5 - 35.7 g/dL   RDW 40.3 47.4 - 25.9 %   Platelets 210 150 - 450 x10E3/uL  RPR   Collection Time: 07/22/21 12:02 PM  Result Value Ref Range   RPR Ser Ql Non Reactive Non Reactive  HIV Antibody (routine testing w rflx)   Collection Time: 07/22/21 12:02 PM  Result Value Ref Range   HIV Screen 4th Generation wRfx Non Reactive Non Reactive  Antibody screen   Collection Time: 07/22/21 12:02 PM  Result Value Ref Range   Antibody Screen Negative Negative    Assessment & Plan:  1) High-risk pregnancy G5P0040 at [redacted]w[redacted]d with an Estimated Date of Delivery: 09/30/21   2) A2/B DM,  sounds unstable.  Pt will upload BS log into mychart when she gets home Treatment plan  start weekly BPP at 32 weeks, growth [place] q 3-4 weeks    Meds: No orders of the defined types were placed in this encounter.   Labs/procedures today: doppler   Reviewed: Preterm labor symptoms and general obstetric precautions including but not limited to vaginal bleeding, contractions, leaking  of fluid and fetal movement were reviewed in detail with the patient.  All questions were answered. Has home bp cuff. Check bp weekly, let us know if >140/90.   Follow-up: Return for start weekly BPP/HROB in 2 weeks.  Future Appointments  Date Time Provider Department Center  08/05/2021  1:30 PM [facility] [place] [facility] None  08/05/2021  2:30 PM Arabella Merles, CNM [facility]  08/12/2021  1:30 PM [facility] [place] [facility] None  08/12/2021  2:50 PM Myna Hidalgo, DO [facility]  08/19/2021  1:30 PM [facility] [place] [facility] None  08/19/2021  2:50 PM Arabella Merles, CNM [facility]  08/20/2021  1:00 PM Theressa Millard, PT [facility]  08/26/2021  1:30 PM [facility] [place] [facility] None  08/26/2021  2:30 PM Cheral Marker, CNM [facility]  08/27/2021  4:00 PM Theressa Millard, PT [facility]  09/02/2021  1:30 PM [facility] [place] [facility] None  09/02/2021  2:30 PM Arabella Merles, CNM [facility]  09/03/2021  1:00 PM Theressa Millard, PT [facility]  09/09/2021  1:30 PM [facility] [place] [facility] None  09/09/2021  2:30 PM Cheral Marker, CNM [facility]  09/16/2021  1:30 PM [facility] [place] [facility] None  09/16/2021  2:30 PM Myna Hidalgo, DO [facility]  09/17/2021  1:00 PM Theressa Millard, PT [facility]  09/23/2021  1:30 PM [facility] [place] [facility] None  09/23/2021  2:30 PM Myna Hidalgo, DO [facility]    Orders Placed This Encounter  Procedures   Tdap vaccine greater than or equal to 7yo IM    Sent 2 mychart messages asking for blood sugars Called and left voicemail 11:58 AM   Jacklyn Shell DNP, CNM 07/23/2021 11:57 AM

## 2021-07-22 NOTE — Patient Instructions (Addendum)
Isabel Conner, I greatly value your feedback.  If you receive a survey following your visit with [place] today, we appreciate you taking the time to fill it out.  Thanks, Cathie Beams, CNM   Gent[facility] violet  [facility] HAS MOVED!!! It is now [facility] ([address] [place], [place] [address]) Entrance located off of [address] Free 24/7 valet parking   Go to [email or web address] to register for FREE online childbirth classes    Call the office [phone]) or go to [facility] if: You begin to have strong, frequent contractions Your water breaks.  Sometimes it is a big gush of fluid, sometimes it is just a trickle that keeps getting your panties wet or running down your legs You have vaginal bleeding.  It is normal to have a small amount of spotting if your cervix was checked.  You don't feel your baby moving like normal.  If you don't, get you something to eat and drink and lay down and focus on feeling your baby move.  You should feel at least 10 movements in 2 hours.  If you don't, you should call the office or go to [facility].    Tdap Vaccine It is recommended that you get the Tdap vaccine during the third trimester of EACH pregnancy to help protect your baby from getting pertussis (whooping cough) 27-36 weeks is the BEST time to do this so that you can pass the protection on to your baby. During pregnancy is better than after pregnancy, but if you are unable to get it during pregnancy it will be offered at the hospital.  You will be offered this vaccine in the office after 27 weeks. If you do not have health insurance, you can get this vaccine at the health department or your family doctor Everyone who will be around your baby should also be up-to-date on their vaccines. Adults (who are not pregnant) only need 1 dose of Tdap during adulthood.   Third Trimester of Pregnancy The third trimester is from week 29 through week 42, months 7 through 9.  The third trimester is a time when the fetus is growing rapidly. At the end of the ninth month, the fetus is about 20 inches in length and weighs 6-10 pounds.  BODY CHANGES Your body goes through many changes during pregnancy. The changes vary from woman to woman.  Your weight will continue to increase. You can expect to gain 25-35 pounds (11-16 kg) by the end of the pregnancy. You may begin to get stretch marks on your hips, abdomen, and breasts. You may urinate more often because the fetus is moving lower into your pelvis and pressing on your bladder. You may develop or continue to have heartburn as a result of your pregnancy. You may develop constipation because certain hormones are causing the muscles that push waste through your intestines to slow down. You may develop hemorrhoids or swollen, bulging veins (varicose veins). You may have pelvic pain because of the weight gain and pregnancy hormones relaxing your joints between the bones in your pelvis. Backaches may result from overexertion of the muscles supporting your posture. You may have changes in your hair. These can include thickening of your hair, rapid growth, and changes in texture. Some women also have hair loss during or after pregnancy, or hair that feels dry or thin. Your hair will most likely return to normal after your baby is born. Your breasts will continue to grow and  be tender. A yellow discharge may leak from your breasts called colostrum. Your belly button may stick out. You may feel short of breath because of your expanding uterus. You may notice the fetus "dropping," or moving lower in your abdomen. You may have a bloody mucus discharge. This usually occurs a few days to a week before labor begins. Your cervix becomes thin and soft (effaced) near your due date. WHAT TO EXPECT AT YOUR PRENATAL EXAMS  You will have prenatal exams every 2 weeks until week 36. Then, you will have weekly prenatal exams. During a routine  prenatal visit: You will be weighed to make sure you and the fetus are growing normally. Your blood pressure is taken. Your abdomen will be measured to track your baby's growth. The fetal heartbeat will be listened to. Any test results from the previous visit will be discussed. You may have a cervical check near your due date to see if you have effaced. At around 36 weeks, your caregiver will check your cervix. At the same time, your caregiver will also perform a test on the secretions of the vaginal tissue. This test is to determine if a type of bacteria, Group B streptococcus, is present. Your caregiver will explain this further. Your caregiver may ask you: What your birth plan is. How you are feeling. If you are feeling the baby move. If you have had any abnormal symptoms, such as leaking fluid, bleeding, severe headaches, or abdominal cramping. If you have any questions. Other tests or screenings that may be performed during your third trimester include: Blood tests that check for low iron levels (anemia). Fetal testing to check the health, activity level, and growth of the fetus. Testing is done if you have certain medical conditions or if there are problems during the pregnancy. FALSE LABOR You may feel small, irregular contractions that eventually go away. These are called Braxton Hicks contractions, or false labor. Contractions may last for hours, days, or even weeks before true labor sets in. If contractions come at regular intervals, intensify, or become painful, it is best to be seen by your caregiver.  SIGNS OF LABOR  Menstrual-like cramps. Contractions that are 5 minutes apart or less. Contractions that start on the top of the uterus and spread down to the lower abdomen and back. A sense of increased pelvic pressure or back pain. A watery or bloody mucus discharge that comes from the vagina. If you have any of these signs before the 37th week of pregnancy, call your caregiver  right away. You need to go to the hospital to get checked immediately. HOME CARE INSTRUCTIONS  Avoid all smoking, herbs, alcohol, and unprescribed drugs. These chemicals affect the formation and growth of the baby. Follow your caregiver's instructions regarding medicine use. There are medicines that are either safe or unsafe to take during pregnancy. Exercise only as directed by your caregiver. Experiencing uterine cramps is a good sign to stop exercising. Continue to eat regular, healthy meals. Wear a good support bra for breast tenderness. Do not use hot tubs, steam rooms, or saunas. Wear your seat belt at all times when driving. Avoid raw meat, uncooked cheese, cat litter boxes, and soil used by cats. These carry germs that can cause birth defects in the baby. Take your prenatal vitamins. Try taking a stool softener (if your caregiver approves) if you develop constipation. Eat more high-fiber foods, such as fresh vegetables or fruit and whole grains. Drink plenty of fluids to keep your urine clear  or pale yellow. Take warm sitz baths to soothe any pain or discomfort caused by hemorrhoids. Use hemorrhoid cream if your caregiver approves. If you develop varicose veins, wear support hose. Elevate your feet for 15 minutes, 3-4 times a day. Limit salt in your diet. Avoid heavy lifting, wear low heal shoes, and practice good posture. Rest a lot with your legs elevated if you have leg cramps or low back pain. Visit your dentist if you have not gone during your pregnancy. Use a soft toothbrush to brush your teeth and be gentle when you floss. A sexual relationship may be continued unless your caregiver directs you otherwise. Do not travel far distances unless it is absolutely necessary and only with the approval of your caregiver. Take prenatal classes to understand, practice, and ask questions about the labor and delivery. Make a trial run to the hospital. Pack your hospital bag. Prepare the baby's  nursery. Continue to go to all your prenatal visits as directed by your caregiver. SEEK MEDICAL CARE IF: You are unsure if you are in labor or if your water has broken. You have dizziness. You have mild pelvic cramps, pelvic pressure, or nagging pain in your abdominal area. You have persistent nausea, vomiting, or diarrhea. You have a bad smelling vaginal discharge. You have pain with urination. SEEK IMMEDIATE MEDICAL CARE IF:  You have a fever. You are leaking fluid from your vagina. You have spotting or bleeding from your vagina. You have severe abdominal cramping or pain. You have rapid weight loss or gain. You have shortness of breath with chest pain. You notice sudden or extreme swelling of your face, hands, ankles, feet, or legs. You have not felt your baby move in over an hour. You have severe headaches that do not go away with medicine. You have vision changes. Document Released: 11/09/2001 Document Revised: 11/20/2013 Document Reviewed: 01/16/2013 [facility] Patient Information 2015 [place], [place]. This information is not intended to replace advice given to you by your health care provider. Make sure you discuss any questions you have with your health care provider.

## 2021-07-23 LAB — ANTIBODY SCREEN: Antibody Screen: NEGATIVE

## 2021-07-23 LAB — CBC
Hematocrit: 37.2 % (ref 34.0–46.6)
Hemoglobin: 12.2 g/dL (ref 11.1–15.9)
MCH: 27.7 pg (ref 26.6–33.0)
MCHC: 32.8 g/dL (ref 31.5–35.7)
MCV: 84 fL (ref 79–97)
Platelets: 210 10*3/uL (ref 150–450)
RBC: 4.41 x10E6/uL (ref 3.77–5.28)
RDW: 12.5 % (ref 11.7–15.4)
WBC: 16.4 10*3/uL — ABNORMAL HIGH (ref 3.4–10.8)

## 2021-07-23 LAB — HIV ANTIBODY (ROUTINE TESTING W REFLEX): HIV Screen 4th Generation wRfx: NONREACTIVE

## 2021-07-23 LAB — RPR: RPR Ser Ql: NONREACTIVE

## 2021-08-04 ENCOUNTER — Other Ambulatory Visit: Payer: Self-pay | Admitting: Advanced Practice Midwife

## 2021-08-04 DIAGNOSIS — O24419 Gestational diabetes mellitus in pregnancy, unspecified control: Secondary | ICD-10-CM

## 2021-08-05 ENCOUNTER — Ambulatory Visit (INDEPENDENT_AMBULATORY_CARE_PROVIDER_SITE_OTHER): Payer: Medicaid Other | Admitting: Advanced Practice Midwife

## 2021-08-05 ENCOUNTER — Other Ambulatory Visit: Payer: Self-pay

## 2021-08-05 ENCOUNTER — Ambulatory Visit (INDEPENDENT_AMBULATORY_CARE_PROVIDER_SITE_OTHER): Payer: Medicaid Other

## 2021-08-05 ENCOUNTER — Encounter: Payer: Self-pay | Admitting: Advanced Practice Midwife

## 2021-08-05 VITALS — BP 126/78 | HR 99 | Wt 261.0 lb

## 2021-08-05 DIAGNOSIS — O24419 Gestational diabetes mellitus in pregnancy, unspecified control: Secondary | ICD-10-CM

## 2021-08-05 DIAGNOSIS — O0993 Supervision of high risk pregnancy, unspecified, third trimester: Secondary | ICD-10-CM

## 2021-08-05 DIAGNOSIS — Z3A32 32 weeks gestation of pregnancy: Secondary | ICD-10-CM | POA: Diagnosis not present

## 2021-08-05 MED ORDER — PANTOPRAZOLE SODIUM 20 MG PO TBEC: 20.0000 mg | DELAYED_RELEASE_TABLET | Freq: Every day | ORAL | 3 refills | Status: DC

## 2021-08-05 NOTE — Progress Notes (Addendum)
HIGH-RISK PREGNANCY VISIT Patient name: Isabel Conner MRN [id number]  Date of birth: 1996/01/07 Chief Complaint:   High Risk Gestation ([place] today; Tums is not controlling heartburn)  History of Present Illness:   Isabel Conner is a 25 y.o. 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   Brought log: about 50% of 2hr PP values were filled in and were all below 120; had FBS almost everyday for past 2 wks and 3 were 100-110 and the rest <95.   Today she reports heartburn/reflux and vaginal itching. Contractions: Not present. Vag. Bleeding: None.  Movement: Present. denies leaking of fluid.   Depression screen [facility] 2/9 03/19/2021  Decreased Interest 2  Down, Depressed, Hopeless 3  PHQ - 2 Score 5  Altered sleeping 2  Tired, decreased energy 2  Change in appetite 2  Feeling bad or failure about yourself  1  Trouble concentrating 0  Moving slowly or fidgety/restless 0  Suicidal thoughts 1  PHQ-9 Score 13     GAD 7 : Generalized Anxiety Score 03/19/2021  Nervous, Anxious, on Edge 0  Control/stop worrying 1  Worry too much - different things 1  Trouble relaxing 0  Restless 0  Easily annoyed or irritable 1  Afraid - awful might happen 1  Total GAD 7 Score 4     Review of Systems:   Pertinent items are noted in HPI Denies abnormal vaginal discharge w/ itching/odor/irritation, headaches, visual changes, shortness of breath, chest pain, abdominal pain, severe nausea/vomiting, or problems with urination or bowel movements unless otherwise stated above. Pertinent History Reviewed:  Reviewed past medical,surgical, social, obstetrical and family history.  Reviewed problem list, medications and allergies. Physical Assessment:   Vitals:   08/05/21 1424  BP: 126/78  Pulse: 99  Weight: 261 lb (118.4 kg)  Body mass index is 46.23  kg/m.           Physical Examination:   General appearance: alert, well appearing, and in no distress  Mental status: alert, oriented to person, place, and time  Skin: warm & dry   Extremities: Edema: Trace    Cardiovascular: normal heart rate noted  Respiratory: normal respiratory effort, no distress  Abdomen: gravid, soft, non-tender  Pelvic: Cervical exam deferred         Fetal Status: Fetal Heart Rate (bpm): 142 u/s   Movement: Present    Fetal Surveillance Testing today: 10/05/21 32 wks,cephalic,posterior placenta gr 3,fhr 142 bpm,AFI 15 cm,simple right adnexal cyst 8.8 x 8.7 x 5.7 cm,unable to visualize ovaries,limited view of heart,BPP 8/8,EFW 2658 g 99.7%    No results found for this or any previous visit (from the past 24 hour(s)).  Assessment & Plan:  High-risk pregnancy: [id number] at [redacted]w[redacted]d with an Estimated Date of Delivery: 09/30/21   1) GDMA2/B, stable on Metformin 1000mg  q hs; EFW 99.7% and AC 99% today; had fetal echo 8/31 at 31wks> normal  2) Heartburn, rx Protonix  3) Vag itching, use OTC generic Monistat 7  4) LGA, EFW 99.7%; discussed that if her fetal growth is estimated at >4500gm that primary C/S would be offered; discussed risk of SD, etc  Meds:  Meds ordered this encounter  Medications   pantoprazole (PROTONIX) 20 MG tablet    Sig: Take 1 tablet (20 mg total) by mouth daily.    Dispense:  30 tablet  Refill:  3    Order Specific Question:   Supervising Provider    Answer:   Lazaro Arms [[id number]]    Labs/procedures today: U/S  Treatment Plan:  growth scans q 4wk; 2x/wk testing scheduled  Reviewed: Preterm labor symptoms and general obstetric precautions including but not limited to vaginal bleeding, contractions, leaking of fluid and fetal movement were reviewed in detail with the patient.  All questions were answered. Does have home bp cuff. Office bp cuff given: not applicable. Check bp weekly, let us know if consistently >140 and/or >90.  Follow-up:  Return for As scheduled.   Future Appointments  Date Time Provider Department Center  08/12/2021  1:30 PM [facility] - [facility] [place] [facility] None  08/12/2021  2:50 PM Myna Hidalgo, DO [facility]  08/19/2021  1:30 PM [facility] [place] [facility] None  08/19/2021  2:50 PM Arabella Merles, CNM [facility]  08/20/2021  1:00 PM Theressa Millard, PT [facility]  08/26/2021  1:30 PM [facility] [place] [facility] None  08/26/2021  2:30 PM Cheral Marker, CNM [facility]  08/27/2021  4:00 PM Theressa Millard, PT [facility]  09/02/2021  1:30 PM [facility] [place] [facility] None  09/02/2021  2:30 PM Arabella Merles, CNM [facility]  09/03/2021  1:00 PM Theressa Millard, PT [facility]  09/09/2021  1:30 PM [facility] [place] [facility] None  09/09/2021  2:30 PM Cheral Marker, CNM [facility]  09/16/2021  1:30 PM [facility] [place] [facility] None  09/16/2021  2:30 PM Myna Hidalgo, DO [facility]  09/17/2021  1:00 PM Theressa Millard, PT [facility]  09/23/2021  1:30 PM [facility] [place] [facility] None  09/23/2021  2:30 PM Myna Hidalgo, DO [facility]    No orders of the defined types were placed in this encounter.  Arabella Merles CNM 08/05/2021 3:11 PM

## 2021-08-05 NOTE — Progress Notes (Addendum)
[place] 32 wks,cephalic,posterior placenta gr 3,fhr 142 bpm,AFI 15 cm,simple right adnexal cyst 8.8 x 8.7 x 5.7 cm,unable to visualize ovaries,limited view of heart,BPP 8/8,EFW 2658 g 99.7%

## 2021-08-05 NOTE — Patient Instructions (Signed)
Isabel Conner, thank you for choosing our office today! We appreciate the opportunity to meet your healthcare needs. You may receive a short survey by mail, e-mail, or through [employer]. If you are happy with your care we would appreciate if you could take just a few minutes to complete the survey questions. We read all of your comments and take your feedback very seriously. Thank you again for choosing our office.  Center for [employer] Team at [facility]  [facility] at [facility] ([address] [place], [place] [address]) Entrance C, located off of [address] Free 24/7 valet parking   CLASSES: Go to [email or web address] to register for classes (childbirth, breastfeeding, waterbirth, infant CPR, daddy bootcamp, etc.)  Call the office [phone]) or go to [facility] if: You begin to have strong, frequent contractions Your water breaks.  Sometimes it is a big gush of fluid, sometimes it is just a trickle that keeps getting your panties wet or running down your legs You have vaginal bleeding.  It is normal to have a small amount of spotting if your cervix was checked.  You don't feel your baby moving like normal.  If you don't, get you something to eat and drink and lay down and focus on feeling your baby move.   If your baby is still not moving like normal, you should call the office or go to [facility].  Call the office [phone]) or go to [facility] hospital for these signs of pre-eclampsia: Severe headache that does not go away with Tylenol Visual changes- seeing spots, double, blurred vision Pain under your right breast or upper abdomen that does not go away with Tums or heartburn medicine Nausea and/or vomiting Severe swelling in your hands, feet, and face   Tdap Vaccine It is recommended that you get the Tdap vaccine during the third trimester of EACH pregnancy to help protect your baby from getting pertussis (whooping cough) 27-36 weeks is the BEST time to do this  so that you can pass the protection on to your baby. During pregnancy is better than after pregnancy, but if you are unable to get it during pregnancy it will be offered at the hospital.  You can get this vaccine with [place], at the health department, your family doctor, or some local pharmacies Everyone who will be around your baby should also be up-to-date on their vaccines before the baby comes. Adults (who are not pregnant) only need 1 dose of Tdap during adulthood.   [facility] Pediatricians/Family Doctors [facility]): [address] Colette Ribas, [phone]           [facility]: [address], [phone]                [facility]): [address], [phone] (call to ask if accepting patients) [facility] Department: [address], [place], [phone]    [facility] Pediatricians/Family Doctors [facility]): [phone], [phone] [facility]: [address] [place], [phone] [facility]: [address], [phone]  [facility] Doctors  [facility]): [phone] [facility]: [address], [phone]   [facility] Doctors [facility]: [address], [phone]  [facility] Family Doctors  [facility]: [phone], [phone]  Home Blood Pressure Monitoring for Patients   Your provider has recommended that you check your  blood pressure (BP) at least once a week at home. If you do not have a blood pressure cuff at home, one will be provided for you. Contact your provider if you have not received your monitor within 1 week.   Helpful Tips for Accurate Home Blood Pressure Checks  Don't smoke, exercise, or drink caffeine 30 minutes before checking your BP Use the restroom before checking your BP (a full bladder can raise your  pressure) Relax in a comfortable upright chair Feet on the ground Left arm resting comfortably on a flat surface at the level of your heart Legs uncrossed Back supported Sit quietly and don't talk Place the cuff on your bare arm Adjust snuggly, so that only two fingertips can fit between your skin and the top of the cuff Check 2 readings separated by at least one minute Keep a log of your BP readings For a visual, please reference this diagram: http://ccnc.care/bpdiagram  Provider Name: [facility]     Phone: [phone]  Zone 1: ALL CLEAR  Continue to monitor your symptoms:  BP reading is less than 140 (top number) or less than 90 (bottom number)  No right upper stomach pain No headaches or seeing spots No feeling nauseated or throwing up No swelling in face and hands  Zone 2: CAUTION Call your doctor's office for any of the following:  BP reading is greater than 140 (top number) or greater than 90 (bottom number)  Stomach pain under your ribs in the middle or right side Headaches or seeing spots Feeling nauseated or throwing up Swelling in face and hands  Zone 3: EMERGENCY  Seek immediate medical care if you have any of the following:  BP reading is greater than160 (top number) or greater than 110 (bottom number) Severe headaches not improving with Tylenol Serious difficulty catching your breath Any worsening symptoms from Zone 2   Third Trimester of Pregnancy The third trimester is from week 29 through week 42, months 7 through 9. The third trimester is a time when the fetus is growing rapidly. At the end of the ninth month, the fetus is about 20 inches in length and weighs 6-10 pounds.  BODY CHANGES Your body goes through many changes during pregnancy. The changes vary from woman to woman.  Your weight will continue to increase. You can expect to gain 25-35 pounds (11-16 kg) by the end of the pregnancy. You may begin to get stretch marks on your hips, abdomen,  and breasts. You may urinate more often because the fetus is moving lower into your pelvis and pressing on your bladder. You may develop or continue to have heartburn as a result of your pregnancy. You may develop constipation because certain hormones are causing the muscles that push waste through your intestines to slow down. You may develop hemorrhoids or swollen, bulging veins (varicose veins). You may have pelvic pain because of the weight gain and pregnancy hormones relaxing your joints between the bones in your pelvis. Backaches may result from overexertion of the muscles supporting your posture. You may have changes in your hair. These can include thickening of your hair, rapid growth, and changes in texture. Some women also have hair loss during or after pregnancy, or hair that feels dry or thin. Your hair will most likely return to normal after your baby is born. Your breasts will continue to grow and be tender. A yellow discharge may leak from your breasts called colostrum. Your belly button may stick out. You may   feel short of breath because of your expanding uterus. You may notice the fetus "dropping," or moving lower in your abdomen. You may have a bloody mucus discharge. This usually occurs a few days to a week before labor begins. Your cervix becomes thin and soft (effaced) near your due date. WHAT TO EXPECT AT YOUR PRENATAL EXAMS  You will have prenatal exams every 2 weeks until week 36. Then, you will have weekly prenatal exams. During a routine prenatal visit: You will be weighed to make sure you and the fetus are growing normally. Your blood pressure is taken. Your abdomen will be measured to track your baby's growth. The fetal heartbeat will be listened to. Any test results from the previous visit will be discussed. You may have a cervical check near your due date to see if you have effaced. At around 36 weeks, your caregiver will check your cervix. At the same time, your  caregiver will also perform a test on the secretions of the vaginal tissue. This test is to determine if a type of bacteria, Group B streptococcus, is present. Your caregiver will explain this further. Your caregiver may ask you: What your birth plan is. How you are feeling. If you are feeling the baby move. If you have had any abnormal symptoms, such as leaking fluid, bleeding, severe headaches, or abdominal cramping. If you have any questions. Other tests or screenings that may be performed during your third trimester include: Blood tests that check for low iron levels (anemia). Fetal testing to check the health, activity level, and growth of the fetus. Testing is done if you have certain medical conditions or if there are problems during the pregnancy. FALSE LABOR You may feel small, irregular contractions that eventually go away. These are called Braxton Hicks contractions, or false labor. Contractions may last for hours, days, or even weeks before true labor sets in. If contractions come at regular intervals, intensify, or become painful, it is best to be seen by your caregiver.  SIGNS OF LABOR  Menstrual-like cramps. Contractions that are 5 minutes apart or less. Contractions that start on the top of the uterus and spread down to the lower abdomen and back. A sense of increased pelvic pressure or back pain. A watery or bloody mucus discharge that comes from the vagina. If you have any of these signs before the 37th week of pregnancy, call your caregiver right away. You need to go to the hospital to get checked immediately. HOME CARE INSTRUCTIONS  Avoid all smoking, herbs, alcohol, and unprescribed drugs. These chemicals affect the formation and growth of the baby. Follow your caregiver's instructions regarding medicine use. There are medicines that are either safe or unsafe to take during pregnancy. Exercise only as directed by your caregiver. Experiencing uterine cramps is a good sign to  stop exercising. Continue to eat regular, healthy meals. Wear a good support bra for breast tenderness. Do not use hot tubs, steam rooms, or saunas. Wear your seat belt at all times when driving. Avoid raw meat, uncooked cheese, cat litter boxes, and soil used by cats. These carry germs that can cause birth defects in the baby. Take your prenatal vitamins. Try taking a stool softener (if your caregiver approves) if you develop constipation. Eat more high-fiber foods, such as fresh vegetables or fruit and whole grains. Drink plenty of fluids to keep your urine clear or pale yellow. Take warm sitz baths to soothe any pain or discomfort caused by hemorrhoids. Use hemorrhoid cream if   your caregiver approves. If you develop varicose veins, wear support hose. Elevate your feet for 15 minutes, 3-4 times a day. Limit salt in your diet. Avoid heavy lifting, wear low heal shoes, and practice good posture. Rest a lot with your legs elevated if you have leg cramps or low back pain. Visit your dentist if you have not gone during your pregnancy. Use a soft toothbrush to brush your teeth and be gentle when you floss. A sexual relationship may be continued unless your caregiver directs you otherwise. Do not travel far distances unless it is absolutely necessary and only with the approval of your caregiver. Take prenatal classes to understand, practice, and ask questions about the labor and delivery. Make a trial run to the hospital. Pack your hospital bag. Prepare the baby's nursery. Continue to go to all your prenatal visits as directed by your caregiver. SEEK MEDICAL CARE IF: You are unsure if you are in labor or if your water has broken. You have dizziness. You have mild pelvic cramps, pelvic pressure, or nagging pain in your abdominal area. You have persistent nausea, vomiting, or diarrhea. You have a bad smelling vaginal discharge. You have pain with urination. SEEK IMMEDIATE MEDICAL CARE IF:  You  have a fever. You are leaking fluid from your vagina. You have spotting or bleeding from your vagina. You have severe abdominal cramping or pain. You have rapid weight loss or gain. You have shortness of breath with chest pain. You notice sudden or extreme swelling of your face, hands, ankles, feet, or legs. You have not felt your baby move in over an hour. You have severe headaches that do not go away with medicine. You have vision changes. Document Released: 11/09/2001 Document Revised: 11/20/2013 Document Reviewed: 01/16/2013 [facility] Patient Information 2015 [place], [place]. This information is not intended to replace advice given to you by your health care provider. Make sure you discuss any questions you have with your health care provider.

## 2021-08-11 ENCOUNTER — Other Ambulatory Visit: Payer: Self-pay | Admitting: Advanced Practice Midwife

## 2021-08-11 DIAGNOSIS — O24419 Gestational diabetes mellitus in pregnancy, unspecified control: Secondary | ICD-10-CM

## 2021-08-12 ENCOUNTER — Ambulatory Visit (INDEPENDENT_AMBULATORY_CARE_PROVIDER_SITE_OTHER): Payer: Medicaid Other | Admitting: Obstetrics & Gynecology

## 2021-08-12 ENCOUNTER — Encounter: Payer: Self-pay | Admitting: Obstetrics & Gynecology

## 2021-08-12 ENCOUNTER — Ambulatory Visit (INDEPENDENT_AMBULATORY_CARE_PROVIDER_SITE_OTHER): Payer: Medicaid Other

## 2021-08-12 ENCOUNTER — Other Ambulatory Visit: Payer: Self-pay

## 2021-08-12 VITALS — BP 116/73 | HR 93 | Wt 259.8 lb

## 2021-08-12 DIAGNOSIS — Z3A33 33 weeks gestation of pregnancy: Secondary | ICD-10-CM

## 2021-08-12 DIAGNOSIS — O24419 Gestational diabetes mellitus in pregnancy, unspecified control: Secondary | ICD-10-CM

## 2021-08-12 DIAGNOSIS — O0993 Supervision of high risk pregnancy, unspecified, third trimester: Secondary | ICD-10-CM

## 2021-08-12 MED ORDER — METFORMIN HCL 500 MG PO TABS
500.0000 mg | ORAL_TABLET | Freq: Every day | ORAL | 3 refills | Status: DC
Start: 2021-08-12 — End: 2021-09-18

## 2021-08-12 MED ORDER — METFORMIN HCL 1000 MG PO TABS: 1000.0000 mg | ORAL_TABLET | Freq: Every day | ORAL | 3 refills | Status: DC

## 2021-08-12 NOTE — Progress Notes (Signed)
[place] 33 wks,cephalic,BPP 8/8,FHR 142 BPM,posterior placenta gr 3,AFI 14.6 cm

## 2021-08-12 NOTE — Progress Notes (Signed)
HIGH-RISK PREGNANCY VISIT Patient name: Isabel Conner MRN [id number]  Date of birth: Jul 06, 1996 Chief Complaint:   Routine Prenatal Visit ([place] today)  History of Present Illness:   Isabel Conner is a 25 y.o. 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   Contractions: Not present. Vag. Bleeding: None.  Movement: Present. denies leaking of fluid.   [facility] 03/19/2021  Decreased Interest 2  Down, Depressed, Hopeless 3  PHQ - 2 Score 5  Altered sleeping 2  Tired, decreased energy 2  Change in appetite 2  Feeling bad or failure about yourself  1  Trouble concentrating 0  Moving slowly or fidgety/restless 0  Suicidal thoughts 1  PHQ-9 Score 13     Current Outpatient Medications  Medication Instructions   Accu-Chek Softclix Lancets lancets Use as instructed to check blood sugar 4 times daily   acetaminophen (TYLENOL) 1,000 mg, Oral, Every 6 hours PRN   Blood Glucose Monitoring Suppl (ACCU-CHEK GUIDE ME) w/Device KIT 1 each, Does not apply, 4 times daily   Blood Pressure Monitor MISC For regular home bp monitoring during pregnancy   Calcium Carbonate Antacid (TUMS PO) Oral   Cetirizine HCl (ZYRTEC PO) Take by mouth.   cyclobenzaprine (FLEXERIL) 10 mg, Oral, 3 times daily PRN   glucose blood (ACCU-CHEK GUIDE) test strip Use as instructed to check blood sugar 4 times daily   lidocaine (LIDODERM) 5 % 1 patch, Transdermal, Every 24 hours, Remove & Discard patch within 12 hours or as directed by MD   metFORMIN (GLUCOPHAGE) 1,000 mg, Oral, Daily at bedtime   pantoprazole (PROTONIX) 20 mg, Oral, Daily   prenatal vitamin w/FE, FA (PRENATAL 1 + 1) 27-1 MG TABS tablet 1 tablet, Oral, Daily     Review of Systems:   Pertinent items are  noted in HPI Denies abnormal vaginal discharge w/ itching/odor/irritation, headaches, visual changes, shortness of breath, chest pain, abdominal pain, severe nausea/vomiting, or problems with urination or bowel movements unless otherwise stated above. Pertinent History Reviewed:  Reviewed past medical,surgical, social, obstetrical and family history.  Reviewed problem list, medications and allergies. Physical Assessment:   Vitals:   08/12/21 1416  BP: 116/73  Pulse: 93  Weight: 259 lb 12.8 oz (117.8 kg)  Body mass index is 46.02 kg/m.           Physical Examination:   General appearance: alert, well appearing, and in no distress  Mental status: normal mood, behavior, speech, dress, motor activity, and thought processes  Skin: warm & dry   Extremities: Edema: Trace    Cardiovascular: normal heart rate noted  Respiratory: normal respiratory effort, no distress  Abdomen: gravid, soft, non-tender  Pelvic: Cervical exam deferred         Fetal Status: Fetal Heart Rate (bpm): 142 u/s   Movement: Present    Fetal Surveillance Testing today: BPP - cephalic,BPP 8/8/2,[id number],posterior placenta gr 3,AFI 14.6 cm  Chaperone: N/A    No results found for this or any previous visit (from the past 24 hour(s)).   Assessment & Plan:  High-risk pregnancy: [id number] 3348w0dth an Estimated Date of Delivery: 09/30/21   1) GDMA2 -concern for elevated sugars- reviewed small diet changes that she should make  including cutting out tea, soda and smaller portions -pt to bring log next visit -plan to add metformin 574m in am -reviewed importance of good control and discussed potential complications of uncontrolled DM -continue with twice weekly testing and visit next week  2) Morbid obesity- BMI 46  Meds: No orders of the defined types were placed in this encounter.   Labs/procedures today: BPP  Treatment Plan:  continue with plan as outlined above  Reviewed: Preterm labor symptoms and  general obstetric precautions including but not limited to vaginal bleeding, contractions, leaking of fluid and fetal movement were reviewed in detail with the patient.  All questions were answered.   Follow-up: Return in about 2 weeks (around 08/26/2021) [place].   Future Appointments  Date Time Provider DAvalon 08/12/2021  2:50 PM OJanyth Pupa DO [facility] FTOBGYN  08/19/2021  1:30 PM [place]- FTOBGYN [place]WH-FTIMG None  08/19/2021  2:50 PM SMyrtis Ser CNM [facility] FTOBGYN  08/20/2021  1:00 PM GMonico Hoar PT [facility] 08/26/2021  1:30 PM [place]- FTOBGYN [place]WH-FTIMG None  08/26/2021  2:30 PM BRoma Schanz CNM [facility] FTOBGYN  08/27/2021  4:00 PM GMonico Hoar PT [facility] 09/02/2021  1:30 PM [place] FTOBGYN [place]WH-FTIMG None  09/02/2021  2:30 PM SMyrtis Ser CNM [facility] FTOBGYN  09/03/2021  1:00 PM GMonico Hoar PT [facility] 09/09/2021  1:30 PM [place]- FTOBGYN [place]WH-FTIMG None  09/09/2021  2:30 PM BRoma Schanz CNM [facility] FTOBGYN  09/15/2021 12:15 PM AKristen Loader DO PP-PIEDPED PP  09/16/2021  1:30 PM [place]- FTOBGYN [place]WH-FTIMG None  09/16/2021  2:30 PM OJanyth Pupa DO [facility] FTOBGYN  09/17/2021  1:00 PM GMonico Hoar PT [facility] 09/23/2021  1:30 PM [place]- FTOBGYN [place]CWH-FTIMG None  09/23/2021  2:30 PM OJanyth Pupa DO [facility] FTOBGYN    No orders of the defined types were placed in this encounter.   JJanyth Pupa DO Attending ORed[place] FCole[facility] Inc[facility] WDe[facility] Company CRipley

## 2021-08-18 ENCOUNTER — Other Ambulatory Visit: Payer: Self-pay | Admitting: Obstetrics & Gynecology

## 2021-08-18 DIAGNOSIS — O24419 Gestational diabetes mellitus in pregnancy, unspecified control: Secondary | ICD-10-CM

## 2021-08-19 ENCOUNTER — Ambulatory Visit (INDEPENDENT_AMBULATORY_CARE_PROVIDER_SITE_OTHER): Payer: Medicaid Other

## 2021-08-19 ENCOUNTER — Ambulatory Visit (INDEPENDENT_AMBULATORY_CARE_PROVIDER_SITE_OTHER): Payer: Medicaid Other | Admitting: Advanced Practice Midwife

## 2021-08-19 ENCOUNTER — Other Ambulatory Visit: Payer: Self-pay

## 2021-08-19 VITALS — Wt 257.2 lb

## 2021-08-19 DIAGNOSIS — O24419 Gestational diabetes mellitus in pregnancy, unspecified control: Secondary | ICD-10-CM | POA: Diagnosis not present

## 2021-08-19 DIAGNOSIS — N83201 Unspecified ovarian cyst, right side: Secondary | ICD-10-CM

## 2021-08-19 DIAGNOSIS — Z3A34 34 weeks gestation of pregnancy: Secondary | ICD-10-CM | POA: Diagnosis not present

## 2021-08-19 DIAGNOSIS — O0993 Supervision of high risk pregnancy, unspecified, third trimester: Secondary | ICD-10-CM

## 2021-08-19 NOTE — Progress Notes (Signed)
[place] 34 wks,cephalic,fhr 146 bpm,posterior placenta gr 3,AFI 13.4 cm,simple right adnexal cyst 10.2 x 5.9 x 8.6 cm,normal left ovary,BPP 8/8

## 2021-08-19 NOTE — Progress Notes (Addendum)
HIGH-RISK PREGNANCY VISIT Patient name: Isabel Conner MRN [id number]  Date of birth: [birth year] Chief Complaint:   Routine Prenatal Visit  History of Present Illness:   Isabel Conner is a 25 y.o. 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  Fasting values in the 80s, all PP values <120 per her report (she will email her log to MyChart).  Today she reports  some upper right side discomfort during U/S and occ w fetal movement . Contractions: Not present. Vag. Bleeding: None.  Movement: Present. denies leaking of fluid.   Depression screen [facility] 2/9 03/19/2021  Decreased Interest 2  Down, Depressed, Hopeless 3  PHQ - 2 Score 5  Altered sleeping 2  Tired, decreased energy 2  Change in appetite 2  Feeling bad or failure about yourself  1  Trouble concentrating 0  Moving slowly or fidgety/restless 0  Suicidal thoughts 1  PHQ-9 Score 13     GAD 7 : Generalized Anxiety Score 03/19/2021  Nervous, Anxious, on Edge 0  Control/stop worrying 1  Worry too much - different things 1  Trouble relaxing 0  Restless 0  Easily annoyed or irritable 1  Afraid - awful might happen 1  Total GAD 7 Score 4     Review of Systems:   Pertinent items are noted in HPI Denies abnormal vaginal discharge w/ itching/odor/irritation, headaches, visual changes, shortness of breath, chest pain, abdominal pain, severe nausea/vomiting, or problems with urination or bowel movements unless otherwise stated above. Pertinent History Reviewed:  Reviewed past medical,surgical, social, obstetrical and family history.  Reviewed problem list, medications and allergies. Physical Assessment:   Vitals:   08/19/21 1419  Weight: 257 lb 3.2 oz (116.7 kg)  Body mass index is 45.56 kg/m.           Physical Examination:   General appearance: alert, well  appearing, and in no distress  Mental status: alert, oriented to person, place, and time  Skin: warm & dry   Extremities: Edema: Trace    Cardiovascular: normal heart rate noted  Respiratory: normal respiratory effort, no distress  Abdomen: gravid, soft, non-tender  Pelvic: Cervical exam deferred         Fetal Status: Fetal Heart Rate (bpm): 146 u/s   Movement: Present    Fetal Surveillance Testing today: [place] 34 wks,cephalic,fhr 146 bpm,posterior placenta gr 3,AFI 13.4 cm,simple right adnexal cyst 10.2 x 5.9 x 8.6 cm(area of pain),normal left ovary,BPP 8/8    No results found for this or any previous visit (from the past 24 hour(s)).  Assessment & Plan:  High-risk pregnancy: G5P0040 at [redacted]w[redacted]d with an Estimated Date of Delivery: 09/30/21   1) A2/B DM, stable on Metformin 500am/1000hs; plan for growth @ 36wks; continue 2x/wk testing and IOL 39wks  2) R ovarian cyst 10cm, discussed w LHE> will f/u 8-12wks PP  2) Possible LGA, EFW 99.7% (2685gm) at 32wks, will f/u @ 36wks  Meds: No orders of the defined types were placed in this encounter.   Labs/procedures today: U/S  Treatment Plan:  growth @ 36wks; 2x/wk testing; IOL 39wks  Reviewed: Preterm labor symptoms and general obstetric precautions including but not limited to vaginal bleeding, contractions, leaking of fluid and fetal movement were reviewed in detail with the patient.  All questions were answered. Does have home bp cuff. Office bp cuff given: not  applicable. Check bp weekly, let us know if consistently >140 and/or >90.  Follow-up: Return for as scheduled; please make sure 10/5 u/s has growth and BPP.   Future Appointments  Date Time Provider Department Center  08/20/2021  1:00 PM Theressa Millard, PT [facility]  08/26/2021  1:30 PM [facility] - FTO[facility]YN [place] [facility] None  08/26/2021  2:30 PM Cheral Marker, CNM [facility]  08/27/2021  4:00 PM Theressa Millard, PT [facility]  09/02/2021  1:30 PM [facility] - FTOBGYN [place] [facility]-FTIMG None   09/02/2021  2:30 PM Arabella Merles, CNM [facility]  09/03/2021  1:00 PM Theressa Millard, PT [facility]  09/09/2021  1:30 PM [facility] - FTOBGYN [place] [facility]-FTIMG None  09/09/2021  2:30 PM Cheral Marker, CNM [facility]  09/15/2021 12:15 PM Myles Gip, DO PP-PIEDPED PP  09/16/2021  1:30 PM [facility] - FTOBGYN [place] [facility]FTIMG None  09/16/2021  2:30 PM Myna Hidalgo, DO [facility] FTOBGYN  09/17/2021  1:00 PM Theressa Millard, PT [facility]  09/23/2021  1:30 PM [facility] - FTOBGYN [place] [facility]-FTIMG None  09/23/2021  2:30 PM Myna Hidalgo, DO [facility] FTOBGYN    No orders of the defined types were placed in this encounter.  Arabella Merles CNM 08/19/2021 2:56 PM

## 2021-08-20 ENCOUNTER — Encounter: Payer: Medicaid Other | Attending: Student | Admitting: Physical Therapy

## 2021-08-25 ENCOUNTER — Other Ambulatory Visit: Payer: Self-pay | Admitting: Advanced Practice Midwife

## 2021-08-25 DIAGNOSIS — O24419 Gestational diabetes mellitus in pregnancy, unspecified control: Secondary | ICD-10-CM

## 2021-08-26 ENCOUNTER — Encounter: Payer: Self-pay | Admitting: Women's Health

## 2021-08-26 ENCOUNTER — Ambulatory Visit (INDEPENDENT_AMBULATORY_CARE_PROVIDER_SITE_OTHER): Payer: Medicaid Other

## 2021-08-26 ENCOUNTER — Other Ambulatory Visit: Payer: Self-pay

## 2021-08-26 ENCOUNTER — Ambulatory Visit (INDEPENDENT_AMBULATORY_CARE_PROVIDER_SITE_OTHER): Payer: Medicaid Other | Admitting: Women's Health

## 2021-08-26 VITALS — BP 117/74 | HR 101 | Wt 261.2 lb

## 2021-08-26 DIAGNOSIS — O24419 Gestational diabetes mellitus in pregnancy, unspecified control: Secondary | ICD-10-CM

## 2021-08-26 DIAGNOSIS — O09893 Supervision of other high risk pregnancies, third trimester: Secondary | ICD-10-CM

## 2021-08-26 DIAGNOSIS — O0993 Supervision of high risk pregnancy, unspecified, third trimester: Secondary | ICD-10-CM

## 2021-08-26 DIAGNOSIS — Z3A35 35 weeks gestation of pregnancy: Secondary | ICD-10-CM | POA: Diagnosis not present

## 2021-08-26 DIAGNOSIS — Z23 Encounter for immunization: Secondary | ICD-10-CM

## 2021-08-26 LAB — POCT URINALYSIS DIPSTICK OB
Blood, UA: NEGATIVE
Glucose, UA: NEGATIVE
Ketones, UA: NEGATIVE
Leukocytes, UA: NEGATIVE
Nitrite, UA: NEGATIVE
POC,PROTEIN,UA: NEGATIVE

## 2021-08-26 NOTE — Progress Notes (Signed)
HIGH-RISK PREGNANCY VISIT Patient name: Isabel Conner MRN [id number]  Date of birth: 02/07/1996 Chief Complaint:   Routine Prenatal Visit  History of Present Illness:   Isabel Conner is a 25 y.o. 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    Today she reports  fbs 81-100 (2 >95, 98 & 100- forgot metformin for 1 of those); 2hr pp 75-152 (3>120, 126, 128, 152-states she forgets am metformin dose at time) . Contractions: Not present. Vag. Bleeding: None.  Movement: Present. denies leaking of fluid.   Depression screen [facility] 2/9 03/19/2021  Decreased Interest 2  Down, Depressed, Hopeless 3  PHQ - 2 Score 5  Altered sleeping 2  Tired, decreased energy 2  Change in appetite 2  Feeling bad or failure about yourself  1  Trouble concentrating 0  Moving slowly or fidgety/restless 0  Suicidal thoughts 1  PHQ-9 Score 13     GAD 7 : Generalized Anxiety Score 03/19/2021  Nervous, Anxious, on Edge 0  Control/stop worrying 1  Worry too much - different things 1  Trouble relaxing 0  Restless 0  Easily annoyed or irritable 1  Afraid - awful might happen 1  Total GAD 7 Score 4     Review of Systems:   Pertinent items are noted in HPI Denies abnormal vaginal discharge w/ itching/odor/irritation, headaches, visual changes, shortness of breath, chest pain, abdominal pain, severe nausea/vomiting, or problems with urination or bowel movements unless otherwise stated above. Pertinent History Reviewed:  Reviewed past medical,surgical, social, obstetrical and family history.  Reviewed problem list, medications and allergies. Physical Assessment:   Vitals:   08/26/21 1433  BP: 117/74  Pulse: (!) 101  Weight: 261 lb 3.2 oz (118.5 kg)  Body mass index is 46.27 kg/m.           Physical Examination:   General appearance: alert, well  appearing, and in no distress  Mental status: alert, oriented to person, place, and time  Skin: warm & dry   Extremities: Edema: Trace    Cardiovascular: normal heart rate noted  Respiratory: normal respiratory effort, no distress  Abdomen: gravid, soft, non-tender  Pelvic: Cervical exam deferred         Fetal Status: Fetal Heart Rate (bpm): 127 u/s   Movement: Present    Fetal Surveillance Testing today: [place] 35 wks,cephalic,BPP 8/8,posterior placenta gr 3,AFI 17.9 cm,simple right ovarian cyst 9.3 X 8.2 X 6 cm,normal left ovary,fhr 127 bpm    Chaperone: N/A    Results for orders placed or performed in visit on 08/26/21 (from the past 24 hour(s))  POC Urinalysis Dipstick OB   Collection Time: 08/26/21  2:37 PM  Result Value Ref Range   Color, UA     Clarity, UA     Glucose, UA Negative Negative   Bilirubin, UA     Ketones, UA neg    Spec Grav, UA     Blood, UA neg    pH, UA     POC,PROTEIN,UA Negative Negative, Trace, Small (1+), Moderate (2+), Large (3+), 4+   Urobilinogen, UA     Nitrite, UA neg    Leukocytes, UA Negative Negative   Appearance     Odor      Assessment & Plan:  High-risk pregnancy: G5P0040 at [redacted]w[redacted]d with an Estimated Date of Delivery: 09/30/21   1) A2/BDM,  metformin 500am/1000pm (forgets at times)-set alarm  2) Suspected fetal macrosomia, efw 99.7% at 32wks, repeat next week  3) Rt ovrian cyst> 9cm  Meds: No orders of the defined types were placed in this encounter.   Labs/procedures today: flu shot and U/S  Treatment Plan:  weekly bpp,   Deliver @ 39wks:_____   Reviewed: Preterm labor symptoms and general obstetric precautions including but not limited to vaginal bleeding, contractions, leaking of fluid and fetal movement were reviewed in detail with the patient.  All questions were answered. Does have home bp cuff. Office bp cuff given: not applicable. Check bp weekly, let us know if consistently >140 and/or >90.  Follow-up: No follow-ups on  file.   Future Appointments  Date Time Provider Department Center  09/02/2021  1:30 PM [facility] - [facility]TO[facility]YN [place] [facility] None  09/02/2021  2:30 PM Arabella Merles, CNM [facility]TO[facility]YN  09/09/2021  1:30 PM [facility] - [facility]TO[facility]YN [place] [facility] None  09/09/2021  2:30 PM Cheral Marker, CNM [facility]TO[facility]  09/15/2021 12:15 PM Myles Gip, DO PP-PIEDPED PP  09/16/2021  1:30 PM [facility] - [facility]TO[facility]YN [place] [facility] None  09/16/2021  2:30 PM Myna Hidalgo, DO [facility] FTOBGYN  09/23/2021  1:30 PM [facility] - [facility]YN [place] [facility] None  09/23/2021  2:30 PM Myna Hidalgo, DO [facility] FTO[facility]YN    Orders Placed This Encounter  Procedures   Flu Vaccine QUAD 26mo+IM (Fluarix, Fluzone & Alfiuria Quad PF)   POC Urinalysis Dipstick OB   Cheral Marker CNM, [facility] 08/26/2021 3:08 PM

## 2021-08-26 NOTE — Progress Notes (Signed)
[place] 35 wks,cephalic,BPP 8/8,posterior placenta gr 3,AFI 17.9 cm,simple right ovarian cyst 9.3 X 8.2 X 6 cm,normal left ovary,fhr 127 bpm

## 2021-08-26 NOTE — Patient Instructions (Signed)
Isabel Conner, thank you for choosing our office today! We appreciate the opportunity to meet your healthcare needs. You may receive a short survey by mail, e-mail, or through [employer]. If you are happy with your care we would appreciate if you could take just a few minutes to complete the survey questions. We read all of your comments and take your feedback very seriously. Thank you again for choosing our office.  Center for [employer] Team at [address] [facility]  [facility] at [facility] ([address] [place], [place] [address]) Entrance C, located off of [address] Free 24/7 valet parking   CLASSES: Go to [email or web address] to register for classes (childbirth, breastfeeding, waterbirth, infant CPR, daddy bootcamp, etc.)  Call the office [phone]) or go to [facility] if: You begin to have strong, frequent contractions Your water breaks.  Sometimes it is a big gush of fluid, sometimes it is just a trickle that keeps getting your panties wet or running down your legs You have vaginal bleeding.  It is normal to have a small amount of spotting if your cervix was checked.  You don't feel your baby moving like normal.  If you don't, get you something to eat and drink and lay down and focus on feeling your baby move.   If your baby is still not moving like normal, you should call the office or go to [facility].  Call the office [phone]) or go to [facility] hospital for these signs of pre-eclampsia: Severe headache that does not go away with Tylenol Visual changes- seeing spots, double, blurred vision Pain under your right breast or upper abdomen that does not go away with Tums or heartburn medicine Nausea and/or vomiting Severe swelling in your hands, feet, and face   Tdap Vaccine It is recommended that you get the Tdap vaccine during the third trimester of EACH pregnancy to help protect your baby from getting pertussis (whooping cough) 27-36 weeks is the BEST time to do this  so that you can pass the protection on to your baby. During pregnancy is better than after pregnancy, but if you are unable to get it during pregnancy it will be offered at the hospital.  You can get this vaccine with [place], at the health department, your family doctor, or some local pharmacies Everyone who will be around your baby should also be up-to-date on their vaccines before the baby comes. Adults (who are not pregnant) only need 1 dose of Tdap during adulthood.   [facility] Pediatricians/Family Doctors [facility]): [address] Colette Ribas, [phone]           [facility]: [address], [phone]                [facility] Medicine [facility]): [address], [phone] (call to ask if accepting patients) [facility] Department: [address], [place], [phone]    [facility] Pediatricians/Family Doctors [facility]): [phone] [address], [phone] [facility]: [address] [place], [phone] [facility] [place]: [address], [phone]  [facility] Doctors  [facility]): [phone] [facility]: [address] [phone]   [facility] Doctors [facility]: [address] [phone]  [facility] Family Doctors  [facility]: [phone], [phone]  Home Blood Pressure Monitoring for Patients   Your provider has recommended that you check your  blood pressure (BP) at least once a week at home. If you do not have a blood pressure cuff at home, one will be provided for you. Contact your provider if you have not received your monitor within 1 week.   Helpful Tips for Accurate Home Blood Pressure Checks  Don't smoke, exercise, or drink caffeine 30 minutes before checking your BP Use the restroom before checking your BP (a full bladder can raise your  pressure) Relax in a comfortable upright chair Feet on the ground Left arm resting comfortably on a flat surface at the level of your heart Legs uncrossed Back supported Sit quietly and don't talk Place the cuff on your bare arm Adjust snuggly, so that only two fingertips can fit between your skin and the top of the cuff Check 2 readings separated by at least one minute Keep a log of your BP readings For a visual, please reference this diagram: http://ccnc.care/bpdiagram  Provider Name: [facility]     Phone: [phone]  Zone 1: ALL CLEAR  Continue to monitor your symptoms:  BP reading is less than 140 (top number) or less than 90 (bottom number)  No right upper stomach pain No headaches or seeing spots No feeling nauseated or throwing up No swelling in face and hands  Zone 2: CAUTION Call your doctor's office for any of the following:  BP reading is greater than 140 (top number) or greater than 90 (bottom number)  Stomach pain under your ribs in the middle or right side Headaches or seeing spots Feeling nauseated or throwing up Swelling in face and hands  Zone 3: EMERGENCY  Seek immediate medical care if you have any of the following:  BP reading is greater than160 (top number) or greater than 110 (bottom number) Severe headaches not improving with Tylenol Serious difficulty catching your breath Any worsening symptoms from Zone 2  Preterm Labor and Birth Information  The normal length of a pregnancy is 39-41 weeks. Preterm labor is when labor starts before 37 completed weeks of pregnancy. What are the risk factors for preterm labor? Preterm labor is more likely to occur in women who: Have certain infections during pregnancy such as a bladder infection, sexually transmitted infection, or infection inside the uterus (chorioamnionitis). Have a shorter-than-normal cervix. Have gone into preterm labor before. Have had surgery on their cervix. Are younger than age 17  or older than age 35. Are African American. Are pregnant with twins or multiple babies (multiple gestation). Take street drugs or smoke while pregnant. Do not gain enough weight while pregnant. Became pregnant shortly after having been pregnant. What are the symptoms of preterm labor? Symptoms of preterm labor include: Cramps similar to those that can happen during a menstrual period. The cramps may happen with diarrhea. Pain in the abdomen or lower back. Regular uterine contractions that may feel like tightening of the abdomen. A feeling of increased pressure in the pelvis. Increased watery or bloody mucus discharge from the vagina. Water breaking (ruptured amniotic sac). Why is it important to recognize signs of preterm labor? It is important to recognize signs of preterm labor because babies who are born prematurely may not be fully developed. This can put them at an increased risk for: Long-term (chronic) heart and lung problems. Difficulty immediately after birth with regulating body systems, including blood sugar, body temperature, heart rate, and breathing rate. Bleeding in the brain. Cerebral palsy. Learning difficulties. Death. These risks are highest for babies who are born before 34 weeks   of pregnancy. How is preterm labor treated? Treatment depends on the length of your pregnancy, your condition, and the health of your baby. It may involve: Having a stitch (suture) placed in your cervix to prevent your cervix from opening too early (cerclage). Taking or being given medicines, such as: Hormone medicines. These may be given early in pregnancy to help support the pregnancy. Medicine to stop contractions. Medicines to help mature the baby's lungs. These may be prescribed if the risk of delivery is high. Medicines to prevent your baby from developing cerebral palsy. If the labor happens before 34 weeks of pregnancy, you may need to stay in the hospital. What should I do if I  think I am in preterm labor? If you think that you are going into preterm labor, call your health care provider right away. How can I prevent preterm labor in future pregnancies? To increase your chance of having a full-term pregnancy: Do not use any tobacco products, such as cigarettes, chewing tobacco, and e-cigarettes. If you need help quitting, ask your health care provider. Do not use street drugs or medicines that have not been prescribed to you during your pregnancy. Talk with your health care provider before taking any herbal supplements, even if you have been taking them regularly. Make sure you gain a healthy amount of weight during your pregnancy. Watch for infection. If you think that you might have an infection, get it checked right away. Make sure to tell your health care provider if you have gone into preterm labor before. This information is not intended to replace advice given to you by your health care provider. Make sure you discuss any questions you have with your health care provider. Document Revised: 03/09/2019 Document Reviewed: 04/07/2016 Elsevier Patient Education  2020 Elsevier Inc.   

## 2021-08-27 ENCOUNTER — Encounter: Payer: Medicaid Other | Admitting: Physical Therapy

## 2021-09-02 ENCOUNTER — Ambulatory Visit (INDEPENDENT_AMBULATORY_CARE_PROVIDER_SITE_OTHER): Payer: Medicaid Other | Admitting: Advanced Practice Midwife

## 2021-09-02 ENCOUNTER — Ambulatory Visit (INDEPENDENT_AMBULATORY_CARE_PROVIDER_SITE_OTHER): Payer: Medicaid Other

## 2021-09-02 ENCOUNTER — Other Ambulatory Visit: Payer: Self-pay

## 2021-09-02 ENCOUNTER — Other Ambulatory Visit (HOSPITAL_COMMUNITY)
Admission: RE | Admit: 2021-09-02 | Discharge: 2021-09-02 | Disposition: A | Discharge status: Home / Self Care | Payer: Medicaid Other | Source: Ambulatory Visit | Attending: Advanced Practice Midwife | Admitting: Advanced Practice Midwife

## 2021-09-02 VITALS — BP 127/76 | HR 93 | Wt 263.0 lb

## 2021-09-02 DIAGNOSIS — O0993 Supervision of high risk pregnancy, unspecified, third trimester: Secondary | ICD-10-CM | POA: Insufficient documentation

## 2021-09-02 DIAGNOSIS — Z3A36 36 weeks gestation of pregnancy: Secondary | ICD-10-CM | POA: Insufficient documentation

## 2021-09-02 DIAGNOSIS — O24419 Gestational diabetes mellitus in pregnancy, unspecified control: Secondary | ICD-10-CM

## 2021-09-02 LAB — POCT URINALYSIS DIPSTICK OB
Blood, UA: NEGATIVE
Glucose, UA: NEGATIVE
Leukocytes, UA: NEGATIVE
Nitrite, UA: NEGATIVE
POC,PROTEIN,UA: NEGATIVE

## 2021-09-02 LAB — OB RESULTS CONSOLE GC/CHLAMYDIA: Gonorrhea: NEGATIVE

## 2021-09-02 LAB — OB RESULTS CONSOLE GBS: GBS: NEGATIVE

## 2021-09-02 NOTE — Progress Notes (Signed)
HIGH-RISK PREGNANCY VISIT Patient name: Isabel Conner MRN [id number]  Date of birth: 08-02-1996 Chief Complaint:   Routine Prenatal Visit, Pregnancy Ultrasound, and High Risk Gestation  History of Present Illness:   Isabel Conner is a 25 y.o. [id number] female at [redacted]w[redacted]d with an Estimated Date of Delivery: 09/30/21 being seen today for ongoing management of a high-risk pregnancy complicated by diabetes mellitus A2/BDM currently on Metformin 500am/1000pm  CBG values: all FBS <95, all 2hr PP values 120 or less  Today she reports no complaints. Contractions: Irritability. Vag. Bleeding: None.  Movement: Present. denies leaking of fluid.   Depression screen [facility] 2/9 03/19/2021  Decreased Interest 2  Down, Depressed, Hopeless 3  PHQ - 2 Score 5  Altered sleeping 2  Tired, decreased energy 2  Change in appetite 2  Feeling bad or failure about yourself  1  Trouble concentrating 0  Moving slowly or fidgety/restless 0  Suicidal thoughts 1  PHQ-9 Score 13     GAD 7 : Generalized Anxiety Score 03/19/2021  Nervous, Anxious, on Edge 0  Control/stop worrying 1  Worry too much - different things 1  Trouble relaxing 0  Restless 0  Easily annoyed or irritable 1  Afraid - awful might happen 1  Total GAD 7 Score 4     Review of Systems:   Pertinent items are noted in HPI Denies abnormal vaginal discharge w/ itching/odor/irritation, headaches, visual changes, shortness of breath, chest pain, abdominal pain, severe nausea/vomiting, or problems with urination or bowel movements unless otherwise stated above. Pertinent History Reviewed:  Reviewed past medical,surgical, social, obstetrical and family history.  Reviewed problem list, medications and allergies. Physical Assessment:   Vitals:   09/02/21 1431  BP: 127/76  Pulse: 93  Weight: 263 lb (119.3 kg)  Body mass index is 46.59 kg/m.           Physical Examination:   General appearance: alert, well appearing, and in no distress  Mental  status: alert, oriented to person, place, and time  Skin: warm & dry   Extremities: Edema: Trace    Cardiovascular: normal heart rate noted  Respiratory: normal respiratory effort, no distress  Abdomen: gravid, soft, non-tender  Pelvic: Cervical exam performed  Dilation: Closed Effacement (%): Thick Station: -3  Fetal Status: Fetal Heart Rate (bpm): 126 u/s   Movement: Present Presentation: Vertex  Fetal Surveillance Testing today: [place] 36 wks,cephalic,BPP 8/8,FHR 126 bpm,posterior placenta gr 3,AFI 10.6 cm,EFW 3570 g 98%,simple right adnexal cyst N/C   Chaperone: Lawanna Kobus Neas    Results for orders placed or performed in visit on 09/02/21 (from the past 24 hour(s))  POC Urinalysis Dipstick OB   Collection Time: 09/02/21  2:37 PM  Result Value Ref Range   Color, UA     Clarity, UA     Glucose, UA Negative Negative   Bilirubin, UA     Ketones, UA mod    Spec Grav, UA     Blood, UA neg    pH, UA     POC,PROTEIN,UA Negative Negative, Trace, Small (1+), Moderate (2+), Large (3+), 4+   Urobilinogen, UA     Nitrite, UA neg    Leukocytes, UA Negative Negative   Appearance     Odor      Assessment & Plan:  High-risk pregnancy: G5P0040 at [redacted]w[redacted]d with an Estimated Date of Delivery: 09/30/21   1) A2B DM, stable on Metformin 500am/1000pm  2) Suspected fetal macrosomia, EFW 98th% today (3570gm)  Meds: No orders  of the defined types were placed in this encounter.   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  All questions were answered. Does have home bp cuff. Office bp cuff given: not applicable. Check bp weekly, let us know if consistently >140 and/or >90.  Follow-up: No follow-ups on  file.   Future Appointments  Date Time Provider Department Center  09/09/2021  1:30 PM [facility] - [facility] [place] [facility] None  09/09/2021  2:30 PM Cheral Marker, CNM [facility]TO[facility]YN  09/15/2021 12:15 PM Myles Gip, DO PP-PIEDPED PP  09/16/2021  1:30 PM [facility] - [facility]YN [place] [facility] None  09/16/2021  2:30 PM Myna Hidalgo, DO [facility] FTO[facility]YN  09/23/2021  1:30 PM [facility] - [facility] [place] [facility] None  09/23/2021  2:30 PM Myna Hidalgo, DO [facility]TO[facility]    Orders Placed This Encounter  Procedures   Strep Gp B NAA+Rflx   POC Urinalysis Dipstick OB   Arabella Merles [facility] 09/02/2021 2:56 PM

## 2021-09-02 NOTE — Progress Notes (Signed)
[place] 36 wks,cephalic,BPP 8/8,FHR 126 bpm,posterior placenta gr 3,AFI 10.6 cm,EFW 3570 g 98%,simple right adnexal cyst N/C

## 2021-09-03 ENCOUNTER — Encounter: Payer: Medicaid Other | Admitting: Physical Therapy

## 2021-09-04 LAB — STREP GP B NAA+RFLX: Strep Gp B NAA+Rflx: NEGATIVE

## 2021-09-09 ENCOUNTER — Encounter: Payer: Self-pay | Admitting: Women's Health

## 2021-09-09 ENCOUNTER — Ambulatory Visit (INDEPENDENT_AMBULATORY_CARE_PROVIDER_SITE_OTHER): Payer: Medicaid Other | Admitting: Women's Health

## 2021-09-09 ENCOUNTER — Other Ambulatory Visit: Payer: Self-pay

## 2021-09-09 ENCOUNTER — Ambulatory Visit (INDEPENDENT_AMBULATORY_CARE_PROVIDER_SITE_OTHER): Payer: Medicaid Other

## 2021-09-09 VITALS — BP 136/87 | HR 94 | Wt 265.0 lb

## 2021-09-09 DIAGNOSIS — Z3A37 37 weeks gestation of pregnancy: Secondary | ICD-10-CM

## 2021-09-09 DIAGNOSIS — O0993 Supervision of high risk pregnancy, unspecified, third trimester: Secondary | ICD-10-CM | POA: Diagnosis not present

## 2021-09-09 DIAGNOSIS — O24419 Gestational diabetes mellitus in pregnancy, unspecified control: Secondary | ICD-10-CM | POA: Diagnosis not present

## 2021-09-09 DIAGNOSIS — O09893 Supervision of other high risk pregnancies, third trimester: Secondary | ICD-10-CM

## 2021-09-09 LAB — POCT URINALYSIS DIPSTICK OB
Blood, UA: NEGATIVE
Leukocytes, UA: NEGATIVE
Nitrite, UA: NEGATIVE
POC,PROTEIN,UA: NEGATIVE

## 2021-09-09 NOTE — Progress Notes (Signed)
[place] 37 wks,cephalic,BPP 8/8,posterior placenta gr 3,fhr 137 bpm,AFI 13.8 cm,simple right adnexal cyst N/C,limited view

## 2021-09-09 NOTE — Progress Notes (Signed)
HIGH-RISK PREGNANCY VISIT Patient name: Isabel Conner MRN [id number]  Date of birth: [birth year] Chief Complaint:   Routine Prenatal Visit ([place] today)  History of Present Illness:   Isabel Conner is a 25 y.o. [id number] female at [redacted]w[redacted]d with an Estimated Date of Delivery: 09/30/21 being seen today for ongoing management of a high-risk pregnancy complicated by diabetes mellitus A2/BDM currently on metformin 500mg  AM/1000mg  PM .    Today she reports  forgot log, reports all sugars wnl . Contractions: Irritability. Vag. Bleeding: None.  Movement: Present. denies leaking of fluid.   Depression screen [facility] 2/9 03/19/2021  Decreased Interest 2  Down, Depressed, Hopeless 3  PHQ - 2 Score 5  Altered sleeping 2  Tired, decreased energy 2  Change in appetite 2  Feeling bad or failure about yourself  1  Trouble concentrating 0  Moving slowly or fidgety/restless 0  Suicidal thoughts 1  PHQ-9 Score 13     GAD 7 : Generalized Anxiety Score 03/19/2021  Nervous, Anxious, on Edge 0  Control/stop worrying 1  Worry too much - different things 1  Trouble relaxing 0  Restless 0  Easily annoyed or irritable 1  Afraid - awful might happen 1  Total GAD 7 Score 4     Review of Systems:   Pertinent items are noted in HPI Denies abnormal vaginal discharge w/ itching/odor/irritation, headaches, visual changes, shortness of breath, chest pain, abdominal pain, severe nausea/vomiting, or problems with urination or bowel movements unless otherwise stated above. Pertinent History Reviewed:  Reviewed past medical,surgical, social, obstetrical and family history.  Reviewed problem list, medications and allergies. Physical Assessment:   Vitals:   09/09/21 1415  BP: 136/87  Pulse: 94  Weight: 265 lb (120.2 kg)  Body mass index is 46.94 kg/m.           Physical Examination:   General appearance: alert, well appearing, and in no distress  Mental status: alert, oriented to person, place, and time  Skin: warm &  dry   Extremities: Edema: Trace    Cardiovascular: normal heart rate noted  Respiratory: normal respiratory effort, no distress  Abdomen: gravid, soft, non-tender  Pelvic: Cervical exam deferred         Fetal Status: Fetal Heart Rate (bpm): 137 u/s   Movement: Present    Fetal Surveillance Testing today: 11/09/21 37 wks,cephalic,BPP 8/8,posterior placenta gr 3,fhr 137 bpm,AFI 13.8 cm,simple right adnexal cyst N/C,limited view   Chaperone: N/A    Results for orders placed or performed in visit on 09/09/21 (from the past 24 hour(s))  POC Urinalysis Dipstick OB   Collection Time: 09/09/21  2:14 PM  Result Value Ref Range   Color, UA     Clarity, UA     Glucose, UA Large (3+) (A) Negative   Bilirubin, UA     Ketones, UA trace    Spec Grav, UA     Blood, UA neg    pH, UA     POC,PROTEIN,UA Negative Negative, Trace, Small (1+), Moderate (2+), Large (3+), 4+   Urobilinogen, UA     Nitrite, UA neg    Leukocytes, UA Negative Negative   Appearance     Odor      Assessment & Plan:  High-risk pregnancy: G5P0040 at [redacted]w[redacted]d with an Estimated Date of Delivery: 09/30/21   1) A2/BDM, stable on metformin 500mg  AM/1,000mg  PM  2) Suspected LGA, EFW 98% last week, extrapolates to 4400g @ 39wks  Meds: No orders of the defined  types were placed in this encounter.   Labs/procedures today: U/S  Treatment Plan:  [place] 37 wks,cephalic,BPP 8/8,posterior placenta gr 3,fhr 137 bpm,AFI 13.8 cm,simple right adnexal cyst N/C,limited view   Reviewed: Term labor symptoms and general obstetric precautions including but not limited to vaginal bleeding, contractions, leaking of fluid and fetal movement were reviewed in detail with the patient.  All questions were answered.   Follow-up: Return for As scheduled.   Future Appointments  Date Time Provider Department Center  09/15/2021 12:15 PM Myles Gip, DO PP-PIEDPED PP  09/16/2021  1:30 PM [facility] - [facility] [place] [facility] None  09/16/2021  2:30 PM Myna Hidalgo, DO [facility] FTOBGYN  09/23/2021  1:30 PM [facility] - [facility] [place] [facility] None  09/23/2021  2:30 PM Myna Hidalgo, DO [facility] FTO[facility]YN    Orders Placed This Encounter  Procedures   POC Urinalysis Dipstick OB   Cheral Marker CNM, [facility] 09/09/2021 3:07 PM

## 2021-09-09 NOTE — Patient Instructions (Signed)
Kara Mead, thank you for choosing our office today! We appreciate the opportunity to meet your healthcare needs. You may receive a short survey by mail, e-mail, or through [employer]. If you are happy with your care we would appreciate if you could take just a few minutes to complete the survey questions. We read all of your comments and take your feedback very seriously. Thank you again for choosing our office.  Center for [employer] Team at [facility]  [facility] at [place] ([address] [place], [place] [address]) Entrance C, located off of [address] Free 24/7 valet parking   CLASSES: Go to [email or web address] to register for classes (childbirth, breastfeeding, waterbirth, infant CPR, daddy bootcamp, etc.)  Call the office [phone]) or go to [facility] if: You begin to have strong, frequent contractions Your water breaks.  Sometimes it is a big gush of fluid, sometimes it is just a trickle that keeps getting your panties wet or running down your legs You have vaginal bleeding.  It is normal to have a small amount of spotting if your cervix was checked.  You don't feel your baby moving like normal.  If you don't, get you something to eat and drink and lay down and focus on feeling your baby move.   If your baby is still not moving like normal, you should call the office or go to [facility].  Call the office [phone]) or go to [facility] hospital for these signs of pre-eclampsia: Severe headache that does not go away with Tylenol Visual changes- seeing spots, double, blurred vision Pain under your right breast or upper abdomen that does not go away with Tums or heartburn medicine Nausea and/or vomiting Severe swelling in your hands, feet, and face   [facility] Pediatricians/Family Doctors [facility]): [address] Colette Ribas, [phone]           [facility]: [address], [phone]                 [facility]): [address], [phone] (call to ask if accepting patients) [facility]: [address] [place], [phone]    [facility] Pediatricians/Family Doctors [facility]): [address], [phone] [facility]: [address] [place], [phone] [facility]: [address], [phone]  [facility] Doctors  [facility]): [phone] [facility]: [address] [phone]   [facility] Doctors [facility]: [address], [phone]  [facility] Doctors  [facility]: [phone], [phone]  Home Blood Pressure Monitoring for Patients   Your provider has recommended that you check your blood pressure (BP) at least once a week at home. If you do not have a blood pressure cuff at home, one will be provided for you. Contact your provider if you have not received your monitor within 1 week.   Helpful Tips for Accurate Home Blood Pressure Checks  Don't smoke, exercise, or drink caffeine 30 minutes before checking your BP Use the restroom before checking your BP (a full bladder can raise your pressure) Relax in a comfortable upright chair Feet on the ground Left arm resting comfortably on a flat surface at the level of your heart Legs uncrossed Back supported Sit quietly and don't talk Place the cuff on your bare arm Adjust snuggly, so that only two fingertips  can fit between your skin and the top of the cuff Check 2 readings separated by at least one minute Keep a log of your BP readings For a visual, please reference this diagram: http://ccnc.care/bpdiagram  Provider Name: [facility]     Phone: [phone]  Zone 1: ALL CLEAR  Continue to monitor your symptoms:  BP reading is less than 140 (top number) or less than 90 (bottom number)  No right  upper stomach pain No headaches or seeing spots No feeling nauseated or throwing up No swelling in face and hands  Zone 2: CAUTION Call your doctor's office for any of the following:  BP reading is greater than 140 (top number) or greater than 90 (bottom number)  Stomach pain under your ribs in the middle or right side Headaches or seeing spots Feeling nauseated or throwing up Swelling in face and hands  Zone 3: EMERGENCY  Seek immediate medical care if you have any of the following:  BP reading is greater than160 (top number) or greater than 110 (bottom number) Severe headaches not improving with Tylenol Serious difficulty catching your breath Any worsening symptoms from Zone 2   Braxton Hicks Contractions Contractions of the uterus can occur throughout pregnancy, but they are not always a sign that you are in labor. You may have practice contractions called Braxton Hicks contractions. These false labor contractions are sometimes confused with true labor. What are [place] contractions? Braxton Hicks contractions are tightening movements that occur in the muscles of the uterus before labor. Unlike true labor contractions, these contractions do not result in opening (dilation) and thinning of the cervix. Toward the end of pregnancy (32-34 weeks), Braxton Hicks contractions can happen more often and may become stronger. These contractions are sometimes difficult to tell apart from true labor because they can be very uncomfortable. You should not feel embarrassed if you go to the hospital with false labor. Sometimes, the only way to tell if you are in true labor is for your health care provider to look for changes in the cervix. The health care provider will do a physical exam and may monitor your contractions. If you are not in true labor, the exam should show that your cervix is not dilating and your water has not broken. If there are no other health problems associated with your  pregnancy, it is completely safe for you to be sent home with false labor. You may continue to have Braxton Hicks contractions until you go into true labor. How to tell the difference between true labor and false labor True labor Contractions last 30-70 seconds. Contractions become very regular. Discomfort is usually felt in the top of the uterus, and it spreads to the lower abdomen and low back. Contractions do not go away with walking. Contractions usually become more intense and increase in frequency. The cervix dilates and gets thinner. False labor Contractions are usually shorter and not as strong as true labor contractions. Contractions are usually irregular. Contractions are often felt in the front of the lower abdomen and in the groin. Contractions may go away when you walk around or change positions while lying down. Contractions get weaker and are shorter-lasting as time goes on. The cervix usually does not dilate or become thin. Follow these instructions at home:  Take over-the-counter and prescription medicines only as told by your health care provider. Keep up with your usual exercises and follow other instructions from your health care provider. Eat and drink lightly if you think  you are going into labor. If Braxton Hicks contractions are making you uncomfortable: Change your position from lying down or resting to walking, or change from walking to resting. Sit and rest in a tub of warm water. Drink enough fluid to keep your urine pale yellow. Dehydration may cause these contractions. Do slow and deep breathing several times an hour. Keep all follow-up prenatal visits as told by your health care provider. This is important. Contact a health care provider if: You have a fever. You have continuous pain in your abdomen. Get help right away if: Your contractions become stronger, more regular, and closer together. You have fluid leaking or gushing from your vagina. You pass  blood-tinged mucus (bloody show). You have bleeding from your vagina. You have low back pain that you never had before. You feel your baby's head pushing down and causing pelvic pressure. Your baby is not moving inside you as much as it used to. Summary Contractions that occur before labor are called Braxton Hicks contractions, false labor, or practice contractions. Braxton Hicks contractions are usually shorter, weaker, farther apart, and less regular than true labor contractions. True labor contractions usually become progressively stronger and regular, and they become more frequent. Manage discomfort from [facility] contractions by changing position, resting in a warm bath, drinking plenty of water, or practicing deep breathing. This information is not intended to replace advice given to you by your health care provider. Make sure you discuss any questions you have with your health care provider. Document Revised: 10/28/2017 Document Reviewed: 03/31/2017 Elsevier Patient Education  [place].

## 2021-09-10 LAB — CERVICOVAGINAL ANCILLARY ONLY
Chlamydia: NEGATIVE
Comment: NEGATIVE
Comment: NORMAL
Neisseria Gonorrhea: NEGATIVE

## 2021-09-12 ENCOUNTER — Inpatient Hospital Stay (HOSPITAL_COMMUNITY)
Admission: AD | Admit: 2021-09-12 | Discharge: 2021-09-13 | Disposition: A | Discharge status: Home / Self Care | Payer: Medicaid Other | Attending: Obstetrics & Gynecology | Admitting: Obstetrics & Gynecology

## 2021-09-12 ENCOUNTER — Other Ambulatory Visit: Payer: Self-pay

## 2021-09-12 ENCOUNTER — Encounter (HOSPITAL_COMMUNITY): Payer: Self-pay | Admitting: Obstetrics & Gynecology

## 2021-09-12 DIAGNOSIS — O26893 Other specified pregnancy related conditions, third trimester: Secondary | ICD-10-CM | POA: Insufficient documentation

## 2021-09-12 DIAGNOSIS — R739 Hyperglycemia, unspecified: Secondary | ICD-10-CM | POA: Diagnosis not present

## 2021-09-12 DIAGNOSIS — Z3A37 37 weeks gestation of pregnancy: Secondary | ICD-10-CM | POA: Insufficient documentation

## 2021-09-12 DIAGNOSIS — Z880 Allergy status to penicillin: Secondary | ICD-10-CM | POA: Insufficient documentation

## 2021-09-12 DIAGNOSIS — Z3689 Encounter for other specified antenatal screening: Secondary | ICD-10-CM

## 2021-09-12 DIAGNOSIS — O24419 Gestational diabetes mellitus in pregnancy, unspecified control: Secondary | ICD-10-CM

## 2021-09-12 DIAGNOSIS — O99891 Other specified diseases and conditions complicating pregnancy: Secondary | ICD-10-CM | POA: Diagnosis not present

## 2021-09-12 DIAGNOSIS — O368120 Decreased fetal movements, second trimester, not applicable or unspecified: Secondary | ICD-10-CM

## 2021-09-12 DIAGNOSIS — O368130 Decreased fetal movements, third trimester, not applicable or unspecified: Secondary | ICD-10-CM | POA: Diagnosis not present

## 2021-09-12 LAB — URINALYSIS, ROUTINE W REFLEX MICROSCOPIC
Bilirubin Urine: NEGATIVE
Glucose, UA: NEGATIVE mg/dL
Hgb urine dipstick: NEGATIVE
Ketones, ur: 20 mg/dL — AB
Leukocytes,Ua: NEGATIVE
Nitrite: NEGATIVE
Protein, ur: NEGATIVE mg/dL
Specific Gravity, Urine: 1.027 (ref 1.005–1.030)
pH: 5 (ref 5.0–8.0)

## 2021-09-12 LAB — GLUCOSE, CAPILLARY: Glucose-Capillary: 102 mg/dL — ABNORMAL HIGH (ref 70–99)

## 2021-09-12 NOTE — MAU Provider Note (Signed)
History     CSN: [id number]  Arrival date and time: 09/12/21 2132   Event Date/Time   First Provider Initiated Contact with Patient 09/12/21 2334      Chief Complaint  Patient presents with   Hyperglycemia   Isabel Conner is a 25 y.o. [id number] at 40w3dwho receives care at [facility].  She presents today for Hyperglycemia.  She states she has been experiencing this for the past few days and called the office tonight and was told to come in.  She reports yesterday her sugar was 250 and was "able to take a few sips of water and walk it out."  She states today it was 176 "after a couple of sips of sprite."  She states it remained regardless of intake.  She reports having "a weak spell."  She states she is suppose to take her BS 4x/day, but today she only took it twice.  She reports her initial was 178 and her "before lunch sugar was 176 and was the same two hours after that."  She reports fetal movement has been decreased since Thursday and she has been concerned because she has to "shake my stomach to get movement."  She reports she has been feeling movement since arrival.  She contributes this to her decreased sugar. She denies vaginal bleeding, discharge, or leakage.    OB History     Gravida  5   Para  0   Term      Preterm      AB  4   Living  0      SAB  1   IAB  2   Ectopic  1   Multiple      Live Births              Past Medical History:  Diagnosis Date   Anal fistula    Chronic back pain    Gestational diabetes    Obesity     Past Surgical History:  Procedure Laterality Date   ADENOIDECTOMY     TONSILLECTOMY      Family History  Problem Relation Age of Onset   Diabetes Mother    Hypertension Mother    Hypertension Father    Diabetes Maternal Grandmother    Diabetes Paternal Grandmother     Social History   Tobacco Use   Smoking status: Never   Smokeless tobacco: Never  Vaping Use   Vaping Use: Never used  Substance Use Topics   Alcohol  use: Not Currently    Alcohol/week: 3.0 standard drinks    Types: 3 Standard drinks or equivalent per week   Drug use: Not Currently    Types: Marijuana    Comment: quit in December 2021    Allergies:  Allergies  Allergen Reactions   Amoxicillin Anaphylaxis   Penicillins Anaphylaxis    Has patient had a PCN reaction causing immediate rash, facial/tongue/throat swelling, SOB or lightheadedness with hypotension: Yes Has patient had a PCN reaction causing severe rash involving mucus membranes or skin necrosis: No Has patient had a PCN reaction that required hospitalization No Has patient had a PCN reaction occurring within the last 10 years: No If all of the above answers are "NO", then may proceed with Cephalosporin use.    Aleve [Naproxen Sodium] Other (See Comments)    Chest pain    Medications Prior to Admission  Medication Sig Dispense Refill Last Dose   acetaminophen (TYLENOL) 500 MG tablet Take 2 tablets (1,000 mg  total) by mouth every 6 (six) hours as needed. 60 tablet 0 Past Week   cyclobenzaprine (FLEXERIL) 10 MG tablet Take 1 tablet (10 mg total) by mouth 3 (three) times daily as needed for muscle spasms. 60 tablet 0 Past Month   metFORMIN (GLUCOPHAGE) 1000 MG tablet Take 1 tablet (1,000 mg total) by mouth at bedtime. 30 tablet 3 09/12/2021   metFORMIN (GLUCOPHAGE) 500 MG tablet Take 1 tablet (500 mg total) by mouth daily with breakfast. 30 tablet 3 09/12/2021   pantoprazole (PROTONIX) 20 MG tablet Take 1 tablet (20 mg total) by mouth daily. 30 tablet 3 09/11/2021   prenatal vitamin w/FE, FA (PRENATAL 1 + 1) 27-1 MG TABS tablet Take 1 tablet by mouth daily at 12 noon.   09/12/2021   Accu-Chek Softclix Lancets lancets Use as instructed to check blood sugar 4 times daily 100 each 12    Blood Glucose Monitoring Suppl (ACCU-CHEK GUIDE ME) w/Device KIT 1 each by Does not apply route 4 (four) times daily. 1 kit 0    Blood Pressure Monitor MISC For regular home bp monitoring during  pregnancy 1 each 0    glucose blood (ACCU-CHEK GUIDE) test strip Use as instructed to check blood sugar 4 times daily 50 each 12     Review of Systems  Eyes:  Negative for visual disturbance.  Gastrointestinal:  Negative for abdominal pain, nausea and vomiting.  Genitourinary:  Negative for difficulty urinating, dysuria, vaginal bleeding and vaginal discharge.  Musculoskeletal:  Back pain: Reports back spasms, but contributes to sciatica.  Neurological:  Positive for light-headedness (When sugar was elevated). Negative for dizziness and headaches.  Physical Exam   Blood pressure 134/71, pulse 97, temperature 98.8 F (37.1 C), temperature source Oral, resp. rate 20, height 5' 3"  (1.6 m), weight 121.6 kg, last menstrual period 12/24/2020, SpO2 99 %, unknown if currently breastfeeding.  Physical Exam Constitutional:      General: She is not in acute distress.    Appearance: Normal appearance. She is obese.  HENT:     Head: Normocephalic and atraumatic.  Eyes:     Conjunctiva/sclera: Conjunctivae normal.  Cardiovascular:     Rate and Rhythm: Normal rate and regular rhythm.     Heart sounds: Normal heart sounds.  Pulmonary:     Effort: Pulmonary effort is normal.     Breath sounds: Normal breath sounds.  Abdominal:     Palpations: Abdomen is soft.     Tenderness: There is no abdominal tenderness.     Comments: Gravid appears LGA   Musculoskeletal:        General: Normal range of motion.     Cervical back: Normal range of motion.  Skin:    General: Skin is warm and dry.  Neurological:     Mental Status: She is alert and oriented to person, place, and time.  Psychiatric:        Mood and Affect: Mood normal.        Behavior: Behavior normal.        Thought Content: Thought content normal.    Fetal Assessment 135 bpm, Mod Var, -Decels, +Accels Toco: No ctx graphed  MAU Course   Results for orders placed or performed during the hospital encounter of 09/12/21 (from the past  24 hour(s))  Glucose, capillary     Status: Abnormal   Collection Time: 09/12/21 10:34 PM  Result Value Ref Range   Glucose-Capillary 102 (H) 70 - 99 mg/dL  Urinalysis, Routine w reflex microscopic  Status: Abnormal   Collection Time: 09/12/21 11:53 PM  Result Value Ref Range   Color, Urine YELLOW YELLOW   APPearance HAZY (A) CLEAR   Specific Gravity, Urine 1.027 1.005 - 1.030   pH 5.0 5.0 - 8.0   Glucose, UA NEGATIVE NEGATIVE mg/dL   Hgb urine dipstick NEGATIVE NEGATIVE   Bilirubin Urine NEGATIVE NEGATIVE   Ketones, ur 20 (A) NEGATIVE mg/dL   Protein, ur NEGATIVE NEGATIVE mg/dL   Nitrite NEGATIVE NEGATIVE   Leukocytes,Ua NEGATIVE NEGATIVE   No results found.  MDM PE Labs: CBG, UA EFM  Assessment and Plan  25 year old G5P0040  SIUP at 37.3 weeks Cat I FT Elevated BS DFM  -POC reviewed -Informed that BS is normal. -Reassured that fetal tracing is reactive. -Will send urine and if normal will discharge to home. -Instructed to keep appt as scheduled. -Patient does not have her glucometer or log for provider review. -Instructed to take to next appt on Wednesday. -Exam performed. -Will await UA results.   Maryann Conners MSN, CNM 09/12/2021, 11:34 PM   Reassessment (12:17 AM)  -UA returns normal. -Discharged to home in stable condition.  Maryann Conners MSN, CNM Advanced Practice Provider, Center for [employer]

## 2021-09-12 NOTE — MAU Note (Signed)
Pt states she has being having elevated blood sugars, ranging from 176-250. Feeling "dizzy and lightheaded" at times and DFM. Denies leaking of fluid or bleeding. Also c/o of back spasm and vaginal pressure yesterday but not today.

## 2021-09-15 ENCOUNTER — Ambulatory Visit (INDEPENDENT_AMBULATORY_CARE_PROVIDER_SITE_OTHER): Payer: Self-pay | Admitting: Pediatrics

## 2021-09-15 ENCOUNTER — Other Ambulatory Visit: Payer: Self-pay

## 2021-09-15 DIAGNOSIS — Z7681 Expectant parent(s) prebirth pediatrician visit: Secondary | ICD-10-CM

## 2021-09-16 ENCOUNTER — Ambulatory Visit (INDEPENDENT_AMBULATORY_CARE_PROVIDER_SITE_OTHER): Payer: Medicaid Other

## 2021-09-16 ENCOUNTER — Encounter: Payer: Self-pay | Admitting: Obstetrics & Gynecology

## 2021-09-16 ENCOUNTER — Other Ambulatory Visit: Payer: Self-pay | Admitting: Advanced Practice Midwife

## 2021-09-16 ENCOUNTER — Ambulatory Visit (INDEPENDENT_AMBULATORY_CARE_PROVIDER_SITE_OTHER): Payer: Medicaid Other | Admitting: Obstetrics & Gynecology

## 2021-09-16 VITALS — BP 129/82 | HR 110 | Wt 271.0 lb

## 2021-09-16 DIAGNOSIS — O09893 Supervision of other high risk pregnancies, third trimester: Secondary | ICD-10-CM

## 2021-09-16 DIAGNOSIS — Z3A38 38 weeks gestation of pregnancy: Secondary | ICD-10-CM

## 2021-09-16 DIAGNOSIS — O24419 Gestational diabetes mellitus in pregnancy, unspecified control: Secondary | ICD-10-CM

## 2021-09-16 DIAGNOSIS — O0993 Supervision of high risk pregnancy, unspecified, third trimester: Secondary | ICD-10-CM | POA: Diagnosis not present

## 2021-09-16 LAB — POCT URINALYSIS DIPSTICK OB
Blood, UA: NEGATIVE
Leukocytes, UA: NEGATIVE
Nitrite, UA: NEGATIVE
POC,PROTEIN,UA: NEGATIVE

## 2021-09-16 NOTE — Progress Notes (Signed)
HIGH-RISK PREGNANCY VISIT Patient name: Isabel Conner MRN [id number]  Date of birth: [birth year] Chief Complaint:   Routine Prenatal Visit  History of Present Illness:   Isabel Conner is a 25 y.o. 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   Contractions: Irritability. Vag. Bleeding: None.  Movement: Present. denies leaking of fluid.   [facility] 03/19/2021  Decreased Interest 2  Down, Depressed, Hopeless 3  PHQ - 2 Score 5  Altered sleeping 2  Tired, decreased energy 2  Change in appetite 2  Feeling bad or failure about yourself  1  Trouble concentrating 0  Moving slowly or fidgety/restless 0  Suicidal thoughts 1  PHQ-9 Score 13     Current Outpatient Medications  Medication Instructions   Accu-Chek Softclix Lancets lancets Use as instructed to check blood sugar 4 times daily   acetaminophen (TYLENOL) 1,000 mg, Oral, Every 6 hours PRN   Blood Glucose Monitoring Suppl (ACCU-CHEK GUIDE ME) w/Device KIT 1 each, Does not apply, 4 times daily   Blood Pressure Monitor MISC For regular home bp monitoring during pregnancy   cyclobenzaprine (FLEXERIL) 10 mg, Oral, 3 times daily PRN   glucose blood (ACCU-CHEK GUIDE) test strip Use as instructed to check blood sugar 4 times daily   metFORMIN (GLUCOPHAGE) 500 mg, Oral, Daily with breakfast   metFORMIN (GLUCOPHAGE) 1,000 mg, Oral, Daily at bedtime   pantoprazole (PROTONIX) 20 mg, Oral, Daily   prenatal vitamin w/FE, FA (PRENATAL 1 + 1) 27-1 MG TABS tablet 1 tablet, Oral, Daily     Review of Systems:   Pertinent items are noted in HPI Denies abnormal vaginal discharge w/ itching/odor/irritation, headaches, visual changes, shortness of breath, chest pain,  abdominal pain, severe nausea/vomiting, or problems with urination or bowel movements unless otherwise stated above. Pertinent History Reviewed:  Reviewed past medical,surgical, social, obstetrical and family history.  Reviewed problem list, medications and allergies. Physical Assessment:   Vitals:   09/16/21 1414  BP: 129/82  Pulse: (!) 110  Weight: 271 lb (122.9 kg)  Body mass index is 48.01 kg/m.           Physical Examination:   General appearance: alert, well appearing, and in no distress  Mental status: alert, oriented to person, place, and time  Skin: warm & dry   Extremities: Edema: Trace    Cardiovascular: normal heart rate noted  Respiratory: normal respiratory effort, no distress  Abdomen: gravid, soft, non-tender  Pelvic: Cervical exam deferred         Fetal Status:     Movement: Present   Prior exam- closed.  FHT noted on BPP listed below  Fetal Surveillance Testing today: BPP- cephalic/oblique position head left,BPP 8/8,FHR 152 bpm,posterior placenta gr 3,AFI 14.8 cm,simple right adnexal cyst N/C,limited view    Chaperone: N/A    Results for orders placed or performed in visit on 09/16/21 (from the past 24 hour(s))  POC Urinalysis Dipstick OB   Collection Time: 09/16/21  2:13 PM  Result Value Ref Range   Color, UA     Clarity, UA     Glucose, UA Moderate (2+) (A) Negative   Bilirubin, UA     Ketones, UA small    Spec Grav, UA  Blood, UA neg    pH, UA     POC,PROTEIN,UA Negative Negative, Trace, Small (1+), Moderate (2+), Large (3+), 4+   Urobilinogen, UA     Nitrite, UA neg    Leukocytes, UA Negative Negative   Appearance     Odor       Assessment & Plan:  High-risk pregnancy: G5P0040 at 52w0dwith an Estimated Date of Delivery: 09/30/21   1) GDMA2 -continue current meds -IOL scheduled for 10/28 am -continue antepartum testing   Meds: No orders of the defined types were placed in this encounter.   Labs/procedures today:  doppler  Treatment Plan:  continue OB care, IOL scheduled  Reviewed: Term labor symptoms and general obstetric precautions including but not limited to vaginal bleeding, contractions, leaking of fluid and fetal movement were reviewed in detail with the patient.  All questions were answered. Pt has home bp cuff. Check bp weekly, let u[place]ore[place]now if >140/90.   Follow-up: No follow-ups on file.   Future Appointments  Date Time Provider [place] 09/23/2021  1:30 PM [facility] [place] None  09/23/2021  2:30 PM OJanyth Pupa DO [facility] FTOBGYN    Orders Placed This Encounter  Procedures   POC Urinalysis Dipstick OB    JJanyth Pupa DO Attending [facility] [facility]ur[facility] WDe[facility] Company [facility] [place]

## 2021-09-16 NOTE — Progress Notes (Signed)
[place] 38 wks,cephalic/oblique position head left,BPP 8/8,FHR 152 bpm,posterior placenta gr 3,AFI 14.8 cm,simple right adnexal cyst N/C,limited view

## 2021-09-17 ENCOUNTER — Encounter: Payer: Medicaid Other | Admitting: Physical Therapy

## 2021-09-18 ENCOUNTER — Other Ambulatory Visit: Payer: Self-pay

## 2021-09-18 ENCOUNTER — Ambulatory Visit (INDEPENDENT_AMBULATORY_CARE_PROVIDER_SITE_OTHER): Payer: Medicaid Other | Admitting: *Deleted

## 2021-09-18 VITALS — BP 119/76 | HR 97 | Wt 269.0 lb

## 2021-09-18 DIAGNOSIS — O0993 Supervision of high risk pregnancy, unspecified, third trimester: Secondary | ICD-10-CM | POA: Diagnosis not present

## 2021-09-18 DIAGNOSIS — O24419 Gestational diabetes mellitus in pregnancy, unspecified control: Secondary | ICD-10-CM | POA: Diagnosis not present

## 2021-09-18 NOTE — Progress Notes (Signed)
   NURSE VISIT- NST  SUBJECTIVE:  Isabel Conner is a 25 y.o. [id number] female at [redacted]w[redacted]d, here for a NST for pregnancy complicated by A2/BDM currently on Metformin .  She reports active fetal movement, contractions: none, vaginal bleeding: none, membranes: intact.   OBJECTIVE:  BP 119/76   Pulse 97   Wt 269 lb (122 kg)   LMP 12/24/2020 (Exact Date)   BMI 47.65 kg/m   Appears well, no apparent distress  No results found for this or any previous visit (from the past 24 hour(s)).  NST: FHR baseline 125 bpm, Variability: moderate, Accelerations:present, Decelerations:  Absent= Cat 1/reactive Toco: none   ASSESSMENT: G5P0040 at [redacted]w[redacted]d with A2/BDM currently on Metformin NST reactive  PLAN: EFM strip reviewed by Dr. Charlotta Newton   Recommendations: keep next appointment as scheduled    Isabel Conner  09/18/2021 10:16 AM

## 2021-09-22 ENCOUNTER — Encounter (HOSPITAL_COMMUNITY): Payer: Self-pay

## 2021-09-22 ENCOUNTER — Other Ambulatory Visit: Payer: Self-pay | Admitting: Advanced Practice Midwife

## 2021-09-22 ENCOUNTER — Telehealth (HOSPITAL_COMMUNITY): Payer: Self-pay | Admitting: *Deleted

## 2021-09-22 ENCOUNTER — Encounter (HOSPITAL_COMMUNITY): Payer: Self-pay | Admitting: *Deleted

## 2021-09-22 NOTE — Telephone Encounter (Signed)
Preadmission screen  

## 2021-09-23 ENCOUNTER — Ambulatory Visit (INDEPENDENT_AMBULATORY_CARE_PROVIDER_SITE_OTHER): Payer: Medicaid Other

## 2021-09-23 ENCOUNTER — Ambulatory Visit (INDEPENDENT_AMBULATORY_CARE_PROVIDER_SITE_OTHER): Payer: Medicaid Other | Admitting: Obstetrics & Gynecology

## 2021-09-23 ENCOUNTER — Encounter: Payer: Self-pay | Admitting: Obstetrics & Gynecology

## 2021-09-23 ENCOUNTER — Other Ambulatory Visit: Payer: Self-pay

## 2021-09-23 VITALS — BP 135/85 | HR 98 | Wt 270.0 lb

## 2021-09-23 DIAGNOSIS — O24419 Gestational diabetes mellitus in pregnancy, unspecified control: Secondary | ICD-10-CM

## 2021-09-23 DIAGNOSIS — Z3A39 39 weeks gestation of pregnancy: Secondary | ICD-10-CM

## 2021-09-23 DIAGNOSIS — O0993 Supervision of high risk pregnancy, unspecified, third trimester: Secondary | ICD-10-CM | POA: Diagnosis not present

## 2021-09-23 NOTE — Progress Notes (Signed)
HIGH-RISK PREGNANCY VISIT Patient name: Isabel Conner MRN [id number]  Date of birth: [birth year] Chief Complaint:   Routine Prenatal Visit, High Risk Gestation, and Pregnancy Ultrasound  History of Present Illness:   Isabel Conner is a 25 y.o. 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    Today she reports no complaints.   Contractions: Irregular. Vag. Bleeding: None.  Movement: Present. denies leaking of fluid.   [facility]  Decreased Interest 2  Down, Depressed, Hopeless 3  PHQ - 2 Score 5  Altered sleeping 2  Tired, decreased energy 2  Change in appetite 2  Feeling bad or failure about yourself  1  Trouble concentrating 0  Moving slowly or fidgety/restless 0  Suicidal thoughts 1  PHQ-9 Score 13     Current Outpatient Medications  Medication Instructions   Accu-Chek Softclix Lancets lancets Use as instructed to check blood sugar 4 times daily   acetaminophen (TYLENOL) 1,000 mg, Oral, Every 6 hours PRN   Blood Glucose Monitoring Suppl (ACCU-CHEK GUIDE ME) w/Device KIT 1 each, Does not apply, 4 times daily   Blood Pressure Monitor MISC For regular home bp monitoring during pregnancy   glucose blood (ACCU-CHEK GUIDE) test strip Use as instructed to check blood sugar 4 times daily   metFORMIN (GLUCOPHAGE) 500 MG tablet Oral, Daily with breakfast   metFORMIN (GLUCOPHAGE) 1,000 mg, Oral, Daily at bedtime   pantoprazole (PROTONIX) 20 mg, Oral, Daily   prenatal vitamin w/FE, FA (PRENATAL 1 + 1) 27-1 MG TABS tablet 1 tablet, Oral, Daily     Review of Systems:   Pertinent items are noted in HPI Denies abnormal vaginal discharge w/ itching/odor/irritation, headaches, visual changes, shortness of breath, chest pain, abdominal pain, severe nausea/vomiting, or problems with urination or bowel movements unless otherwise  stated above. Pertinent History Reviewed:  Reviewed past medical,surgical, social, obstetrical and family history.  Reviewed problem list, medications and allergies. Physical Assessment:   Vitals:   09/23/21 1411  BP: 135/85  Pulse: 98  Weight: 270 lb (122.5 kg)  Body mass index is 47.83 kg/m.           Physical Examination:   General appearance: alert, well appearing, and in no distress  Mental status: alert, oriented to person, place, and time  Skin: warm & dry   Extremities: Edema: Trace    Cardiovascular: normal heart rate noted  Respiratory: normal respiratory effort, no distress  Abdomen: gravid, soft, non-tender  Pelvic: Cervical exam performed  Dilation: Fingertip Effacement (%): Thick Station: -3  Fetal Status: Fetal Heart Rate (bpm): 121 u/s   Movement: Present Presentation: Vertex  Fetal Surveillance Testing today: cephalic,posterior placenta gr 3,FHR 121 bpm,BPP 8/8,AFI 9.2 cm,simple right adnexal cyst 10.8 x 8 x 6.9 cm   Chaperone: pt declined  No results found for this or any previous visit (from the past 24 hour(s)).   Assessment & Plan:  High-risk pregnancy: [id number] 36w0dith an Estimated Date of Delivery: 09/30/21   1) GDMA2- doing well with metformin BPP today 8/8 Scheduled for IOL 10/28  Meds: No orders of the defined types were placed in this encounter.   Labs/procedures today: BPP  Treatment Plan:  IOL scheduled  Reviewed: Term labor symptoms and general obstetric precautions including but not limited to vaginal bleeding, contractions, leaking of fluid and fetal movement were reviewed in  detail with the patient.  All questions were answered. Pt has home bp cuff. Check bp weekly, let us know if >140/90.   Follow-up: Return in about 5 weeks (around 10/28/2021) for postpartum visit (IOL scheduled for 10/28).   Future Appointments  Date Time Provider [place]  09/25/2021  6:30 AM [facility] [place] None  10/28/2021  1:30 PM  Myrtis Ser, CNM [facility]    Orders Placed This Encounter  Procedures   POC Urinalysis Dipstick OB    Janyth Pupa, DO Attending [place], [facility] for [facility], [place]

## 2021-09-23 NOTE — Progress Notes (Signed)
[place] 39 wks,cephalic,posterior placenta gr 3,FHR 121 bpm,BPP 8/8,AFI 9.2 cm,simple right adnexal cyst 10.8 x 8 x 6.9 cm

## 2021-09-24 LAB — SARS CORONAVIRUS 2 (TAT 6-24 HRS): SARS Coronavirus 2: NEGATIVE

## 2021-09-25 ENCOUNTER — Inpatient Hospital Stay (HOSPITAL_COMMUNITY): Payer: Medicaid Other | Admitting: Anesthesiology

## 2021-09-25 ENCOUNTER — Encounter (HOSPITAL_COMMUNITY): Payer: Self-pay | Admitting: Family Medicine

## 2021-09-25 ENCOUNTER — Other Ambulatory Visit: Payer: Self-pay

## 2021-09-25 ENCOUNTER — Inpatient Hospital Stay (HOSPITAL_COMMUNITY)
Admission: AD | Admit: 2021-09-25 | Discharge: 2021-09-30 | DRG: 786 | Disposition: A | Discharge status: Home / Self Care | Payer: Medicaid Other | Attending: Obstetrics & Gynecology | Admitting: Obstetrics & Gynecology

## 2021-09-25 ENCOUNTER — Inpatient Hospital Stay (HOSPITAL_COMMUNITY): Payer: Medicaid Other

## 2021-09-25 DIAGNOSIS — O411230 Chorioamnionitis, third trimester, not applicable or unspecified: Secondary | ICD-10-CM | POA: Diagnosis present

## 2021-09-25 DIAGNOSIS — O24425 Gestational diabetes mellitus in childbirth, controlled by oral hypoglycemic drugs: Principal | ICD-10-CM | POA: Diagnosis present

## 2021-09-25 DIAGNOSIS — O99214 Obesity complicating childbirth: Secondary | ICD-10-CM | POA: Diagnosis present

## 2021-09-25 DIAGNOSIS — Z3A39 39 weeks gestation of pregnancy: Secondary | ICD-10-CM

## 2021-09-25 DIAGNOSIS — O99324 Drug use complicating childbirth: Secondary | ICD-10-CM | POA: Diagnosis present

## 2021-09-25 DIAGNOSIS — F1290 Cannabis use, unspecified, uncomplicated: Secondary | ICD-10-CM | POA: Diagnosis present

## 2021-09-25 DIAGNOSIS — O0990 Supervision of high risk pregnancy, unspecified, unspecified trimester: Secondary | ICD-10-CM

## 2021-09-25 DIAGNOSIS — E669 Obesity, unspecified: Secondary | ICD-10-CM | POA: Diagnosis present

## 2021-09-25 DIAGNOSIS — O0993 Supervision of high risk pregnancy, unspecified, third trimester: Secondary | ICD-10-CM

## 2021-09-25 DIAGNOSIS — Z880 Allergy status to penicillin: Secondary | ICD-10-CM

## 2021-09-25 DIAGNOSIS — O24419 Gestational diabetes mellitus in pregnancy, unspecified control: Secondary | ICD-10-CM

## 2021-09-25 DIAGNOSIS — O411290 Chorioamnionitis, unspecified trimester, not applicable or unspecified: Secondary | ICD-10-CM | POA: Diagnosis not present

## 2021-09-25 LAB — GLUCOSE, CAPILLARY
Glucose-Capillary: 174 mg/dL — ABNORMAL HIGH (ref 70–99)
Glucose-Capillary: 114 mg/dL — ABNORMAL HIGH (ref 70–99)
Glucose-Capillary: 86 mg/dL (ref 70–99)
Glucose-Capillary: 95 mg/dL (ref 70–99)
Glucose-Capillary: 82 mg/dL (ref 70–99)
Glucose-Capillary: 98 mg/dL (ref 70–99)
Glucose-Capillary: 75 mg/dL (ref 70–99)

## 2021-09-25 LAB — CBC
HCT: 36.7 % (ref 36.0–46.0)
Hemoglobin: 11.8 g/dL — ABNORMAL LOW (ref 12.0–15.0)
MCH: 27.1 pg (ref 26.0–34.0)
MCHC: 32.2 g/dL (ref 30.0–36.0)
MCV: 84.4 fL (ref 80.0–100.0)
Platelets: 202 10*3/uL (ref 150–400)
RBC: 4.35 MIL/uL (ref 3.87–5.11)
RDW: 14 % (ref 11.5–15.5)
WBC: 13.2 10*3/uL — ABNORMAL HIGH (ref 4.0–10.5)
nRBC: 0 % (ref 0.0–0.2)

## 2021-09-25 LAB — TYPE AND SCREEN
ABO/RH(D): A POS
Antibody Screen: NEGATIVE

## 2021-09-25 LAB — RPR: RPR Ser Ql: NONREACTIVE

## 2021-09-25 MED ORDER — MISOPROSTOL 50MCG HALF TABLET
50.0000 ug | ORAL_TABLET | ORAL | Status: DC | PRN
  Administered 2021-09-25: 50 ug via ORAL

## 2021-09-25 MED ORDER — EPHEDRINE 5 MG/ML INJ: 10.0000 mg | INTRAVENOUS | Status: DC | PRN

## 2021-09-25 MED ORDER — MISOPROSTOL 25 MCG QUARTER TABLET: 25.0000 ug | ORAL_TABLET | ORAL | Status: DC | PRN

## 2021-09-25 MED ORDER — LIDOCAINE HCL (PF) 1 % IJ SOLN
INTRAMUSCULAR | Status: DC | PRN
  Administered 2021-09-25: 10 mL via EPIDURAL

## 2021-09-25 MED ORDER — OXYCODONE-ACETAMINOPHEN 5-325 MG PO TABS: 1.0000 | ORAL_TABLET | ORAL | Status: DC | PRN

## 2021-09-25 MED ORDER — TERBUTALINE SULFATE 1 MG/ML IJ SOLN
0.2500 mg | Freq: Once | INTRAMUSCULAR | Status: DC | PRN
Start: 2021-09-25 — End: 2021-09-26

## 2021-09-25 MED ORDER — OXYTOCIN BOLUS FROM INFUSION: 333.0000 mL | Freq: Once | INTRAVENOUS | Status: DC

## 2021-09-25 MED ORDER — PHENYLEPHRINE 40 MCG/ML (10ML) SYRINGE FOR IV PUSH (FOR BLOOD PRESSURE SUPPORT): 80.0000 ug | PREFILLED_SYRINGE | INTRAVENOUS | Status: DC | PRN

## 2021-09-25 MED ORDER — ONDANSETRON HCL 4 MG/2ML IJ SOLN
4.0000 mg | Freq: Four times a day (QID) | INTRAMUSCULAR | Status: DC | PRN
  Administered 2021-09-25: 4 mg via INTRAVENOUS
  Filled 2021-09-25: qty 2

## 2021-09-25 MED ORDER — FENTANYL-BUPIVACAINE-NACL 0.5-0.125-0.9 MG/250ML-% EP SOLN
12.0000 mL/h | EPIDURAL | Status: DC | PRN
  Administered 2021-09-26: 12 mL/h via EPIDURAL
  Filled 2021-09-25: qty 250

## 2021-09-25 MED ORDER — LACTATED RINGERS IV SOLN
500.0000 mL | Freq: Once | INTRAVENOUS | Status: AC
Start: 2021-09-25 — End: 2021-09-26
  Administered 2021-09-26: 500 mL via INTRAVENOUS

## 2021-09-25 MED ORDER — ACETAMINOPHEN 325 MG PO TABS: 650.0000 mg | ORAL_TABLET | ORAL | Status: DC | PRN

## 2021-09-25 MED ORDER — SOD CITRATE-CITRIC ACID 500-334 MG/5ML PO SOLN
30.0000 mL | ORAL | Status: DC | PRN
  Filled 2021-09-25: qty 30

## 2021-09-25 MED ORDER — LACTATED RINGERS IV SOLN
500.0000 mL | INTRAVENOUS | Status: DC | PRN
  Administered 2021-09-25 – 2021-09-26 (×2): 500 mL via INTRAVENOUS

## 2021-09-25 MED ORDER — LIDOCAINE HCL (PF) 1 % IJ SOLN
30.0000 mL | INTRAMUSCULAR | Status: DC | PRN
Start: 2021-09-25 — End: 2021-09-26

## 2021-09-25 MED ORDER — OXYTOCIN-SODIUM CHLORIDE 30-0.9 UT/500ML-% IV SOLN
1.0000 m[IU]/min | INTRAVENOUS | Status: DC
  Administered 2021-09-25: 2 m[IU]/min via INTRAVENOUS
  Filled 2021-09-25: qty 500

## 2021-09-25 MED ORDER — TERBUTALINE SULFATE 1 MG/ML IJ SOLN
0.2500 mg | Freq: Once | INTRAMUSCULAR | Status: DC | PRN
Start: 2021-09-25 — End: 2021-09-25

## 2021-09-25 MED ORDER — PHENYLEPHRINE 40 MCG/ML (10ML) SYRINGE FOR IV PUSH (FOR BLOOD PRESSURE SUPPORT)
80.0000 ug | PREFILLED_SYRINGE | INTRAVENOUS | Status: DC | PRN
  Filled 2021-09-25: qty 10

## 2021-09-25 MED ORDER — OXYCODONE-ACETAMINOPHEN 5-325 MG PO TABS: 2.0000 | ORAL_TABLET | ORAL | Status: DC | PRN

## 2021-09-25 MED ORDER — DIPHENHYDRAMINE HCL 50 MG/ML IJ SOLN
12.5000 mg | INTRAMUSCULAR | Status: DC | PRN
Start: 2021-09-25 — End: 2021-09-26
  Administered 2021-09-25: 12.5 mg via INTRAVENOUS
  Filled 2021-09-25: qty 1

## 2021-09-25 MED ORDER — LACTATED RINGERS IV SOLN: INTRAVENOUS | Status: DC

## 2021-09-25 MED ORDER — FENTANYL CITRATE (PF) 100 MCG/2ML IJ SOLN
100.0000 ug | INTRAMUSCULAR | Status: DC | PRN
  Administered 2021-09-25 (×2): 100 ug via INTRAVENOUS
  Filled 2021-09-25 (×2): qty 2

## 2021-09-25 MED ORDER — DIPHENHYDRAMINE HCL 50 MG/ML IJ SOLN
12.5000 mg | INTRAMUSCULAR | Status: DC | PRN
Start: 2021-09-25 — End: 2021-09-25

## 2021-09-25 MED ORDER — OXYTOCIN-SODIUM CHLORIDE 30-0.9 UT/500ML-% IV SOLN
2.5000 [IU]/h | INTRAVENOUS | Status: DC
  Administered 2021-09-26: 2.5 [IU]/h via INTRAVENOUS

## 2021-09-25 MED ORDER — FENTANYL-BUPIVACAINE-NACL 0.5-0.125-0.9 MG/250ML-% EP SOLN
12.0000 mL/h | EPIDURAL | Status: DC | PRN
  Administered 2021-09-25: 12 mL/h via EPIDURAL
  Filled 2021-09-25: qty 250

## 2021-09-25 MED ORDER — MISOPROSTOL 50MCG HALF TABLET
ORAL_TABLET | ORAL | Status: AC
  Filled 2021-09-25: qty 1

## 2021-09-25 NOTE — Anesthesia Preprocedure Evaluation (Signed)
Anesthesia Evaluation  Patient identified by MRN, date of birth, ID band Patient awake    Reviewed: Allergy & Precautions, H&P , NPO status , Patient's Chart, lab work & pertinent test results, reviewed documented beta blocker date and time   Airway Mallampati: II  TM Distance: >3 FB Neck ROM: full    Dental no notable dental hx. (+) Teeth Intact, Dental Advisory Given   Pulmonary neg pulmonary ROS,    Pulmonary exam normal breath sounds clear to auscultation       Cardiovascular negative cardio ROS Normal cardiovascular exam Rhythm:regular Rate:Normal     Neuro/Psych negative neurological ROS  negative psych ROS   GI/Hepatic negative GI ROS, Neg liver ROS,   Endo/Other  diabetes, GestationalMorbid obesity  Renal/GU negative Renal ROS  negative genitourinary   Musculoskeletal   Abdominal   Peds  Hematology negative hematology ROS (+)   Anesthesia Other Findings   Reproductive/Obstetrics (+) Pregnancy                             Anesthesia Physical Anesthesia Plan  ASA: 3  Anesthesia Plan: Epidural   Post-op Pain Management:    Induction:   PONV Risk Score and Plan: 2 and Treatment may vary due to age or medical condition  Airway Management Planned: Natural Airway  Additional Equipment: None  Intra-op Plan:   Post-operative Plan:   Informed Consent: I have reviewed the patients History and Physical, chart, labs and discussed the procedure including the risks, benefits and alternatives for the proposed anesthesia with the patient or authorized representative who has indicated his/her understanding and acceptance.     Dental Advisory Given  Plan Discussed with: Anesthesiologist  Anesthesia Plan Comments: (Labs checked- platelets confirmed with RN in room. Fetal heart tracing, per RN, reported to be stable enough for sitting procedure. Discussed epidural, and patient  consents to the procedure:  included risk of possible headache,backache, failed block, allergic reaction, and nerve injury. This patient was asked if she had any questions or concerns before the procedure started.)        Anesthesia Quick Evaluation

## 2021-09-25 NOTE — Anesthesia Procedure Notes (Signed)
Epidural Patient location during procedure: OB Start time: 09/25/2021 1:29 PM End time: 09/25/2021 1:33 PM  Staffing Anesthesiologist: Bethena Midget, MD  Preanesthetic Checklist Completed: patient identified, IV checked, site marked, risks and benefits discussed, surgical consent, monitors and equipment checked, pre-op evaluation and timeout performed  Epidural Patient position: sitting Prep: DuraPrep and site prepped and draped Patient monitoring: continuous pulse ox and blood pressure Approach: midline Location: L3-L4 Injection technique: LOR air  Needle:  Needle type: Tuohy  Needle gauge: 17 G Needle length: 9 cm and 9 Needle insertion depth: 8 cm Catheter type: closed end flexible Catheter size: 19 Gauge Catheter at skin depth: 14 cm Test dose: negative  Assessment Events: blood not aspirated, injection not painful, no injection resistance, no paresthesia and negative IV test

## 2021-09-25 NOTE — Progress Notes (Signed)
Patient ID: Isabel Conner, female   DOB: [birth year], 25 y.o.   MRN: [id number] Labor Progress Note Isabel Conner is a 25 y.o. [id number] at [redacted]w[redacted]d admitted for IOL due to [facility] on metformin.  S: Resting comfortably in bed. Feeling sleepy.  O:  BP 125/74   Pulse 91   Temp 99.2 F (37.3 C) (Oral)   Ht 5\' 3"  (1.6 m)   Wt 123.8 kg   LMP 12/24/2020 (Exact Date)   SpO2 99%   BMI 48.34 kg/m  EFM: baseline 140/ moderate variability/accelerations present/no decels  CVE: Dilation: 4 Effacement (%): 50 Cervical Position: Posterior Station: -3 Presentation: Vertex Exam by:: [email or web address], CNM   A&P: 25 y.o. 34 [redacted]w[redacted]d admitted for IOL due to [facility] on metformin. #Labor: Progressing well. Will plan for recheck around midnight after she has been titrated on pitocin with adequate contractions according to IUPC. #Pain: Well controlled, has epidural #FWB: Category I strip #GBS negative   [facility], MD 11:31 PM

## 2021-09-25 NOTE — Progress Notes (Addendum)
Labor Progress Note Isabel Conner is a 25 y.o. [id number] at [redacted]w[redacted]d presented for labor due to gestational diabetes on metformin. S: Labor is progressing well, and patients pain is well controlled on epidural.  O:  BP 132/74   Pulse 82   Temp 99.1 F (37.3 C) (Oral)   Ht 5\' 3"  (1.6 m)   Wt 123.8 kg   LMP 12/24/2020 (Exact Date)   SpO2 99%   BMI 48.34 kg/m  EFM: 130/Variable/acels, no decels  CVE: Dilation: 4 Effacement (%): 50 Cervical Position: Posterior Station: -3 Presentation: Vertex Exam by:: [email or web address], CNM Foley balloon placed for cervical ripening. Intrauterine pressure catheter placed to monitor contractions.  A&P: 25 y.o. 34 [redacted]w[redacted]d gestational diabetes on metformin. #Labor: Progressing well. Foley balloon, intrauterine catheter in place. #Pain: Epidural #FWB: Stage 1 #GBS negative  [redacted]w[redacted]d, Medical Student 7:37 PM   I was present and performed the exam and procedure and agree with the above student's note.  LEFTW[place]KIRBY, Aubryanna Nesheim Certified Nurse-Midwife

## 2021-09-25 NOTE — Progress Notes (Signed)
Isabel Conner is a 25 y.o. [id number] at [redacted]w[redacted]d by LMP admitted for induction of labor due to Gestational diabetes on metformin.  Subjective: Pt comfortable, feeling mild cramping. S/O in room for support.  Objective: BP 136/82   Pulse 84   Temp 98.5 F (36.9 C) (Oral)   Ht 5\' 3"  (1.6 m)   Wt 123.8 kg   LMP 12/24/2020 (Exact Date)   BMI 48.34 kg/m  No intake/output data recorded. No intake/output data recorded.  FHT:  FHR: 135 bpm, variability: moderate,  accelerations:  Present,  decelerations:  Absent UC:   irregular, every 2-10 minutes SVE:   Dilation: 1 Effacement (%): 50 Station: -3 Exam by:: [email or web address], CNM Foley balloon placed without difficulty. Pt tolerated well.  Labs: Lab Results  Component Value Date   WBC 13.2 (H) 09/25/2021   HGB 11.8 (L) 09/25/2021   HCT 36.7 09/25/2021   MCV 84.4 09/25/2021   PLT 202 09/25/2021    Assessment / Plan: Induction of labor due to gestational diabetes Foley balloon in place Cytotec buccal dose x 1  Labor: Progressing normally Preeclampsia:   n/a Fetal Wellbeing:  Category I Pain Control:  Labor support without medications I/D:   GBS neg Anticipated MOD:  NSVD  09/27/2021 09/25/2021, 10:45 AM

## 2021-09-25 NOTE — H&P (Signed)
OBSTETRIC ADMISSION HISTORY AND PHYSICAL  Isabel Conner is a 25 y.o. female V8L3810 with IUP at 42w2dby LMP c/w 6 week [place] for IOL for GDM A2/B, likely pregestational.  She reports +FMs, No LOF, no VB, no blurry vision, headaches or peripheral edema, and RUQ pain.  She plans on breast feeding. She request IUD vs Phexxi for birth control. [facility]  Dating: By LMP c/w 6 week [place]--->  Estimated Date of Delivery: 09/30/21  Sono:    _0 , CWD, normal anatomy, vertex presentation, 98% EFW    FAMILY TREE  LAB RESULTS  Language English Pap 05/18/21 neg  Initiated care at 12wk GC/CT Initial:   -/-         36wks:-/-  Dating by LMP c/w 6wk U/S    Support person  Genetics NT/IT: neg     Panorama: insufficient fetal DNA     Carrier Screen neg  Flu vaccine 08/26/2021 Harrison/Hgb Elec neg  TDaP vaccine 07/22/21    Rhogam n/a Blood Type A/Positive/-- (04/21 1103)    Antibody Negative (04/21 1103)  Anatomy [place] female, EFW 99% HBsAg Negative (04/21 1103)  Feeding Plan Breast RPR Non Reactive (04/21 1103)  Contraception IUD vs Phexxi Rubella  1.22 (04/21 1103)  Circumcision No HIV Non Reactive (04/21 1103)  Pediatrician [facility] Hep C neg  Prenatal Classes       A1C/GTT Early: 6.7     26-28wks: n/a  BTL Consent     VBAC Consent n/a GBS   neg     _1  PCN allergy  Waterbirth _2 Class _3 Consent _4 CNM visit      Prenatal History/Complications: SAB x 1, IAB x 2, GDM on metformin this pregnancy  Past Medical History: Past Medical History:  Diagnosis Date   Anal fistula    Chronic back pain    Gestational diabetes    Obesity     Past Surgical History: Past Surgical History:  Procedure Laterality Date   ADENOIDECTOMY     TONSILLECTOMY      Obstetrical History: OB History     Gravida  5   Para  0   Term      Preterm      AB  4   Living  0      SAB  1   IAB  2   Ectopic  1   Multiple      Live Births               Social History Social History   Socioeconomic History   Marital status: Single    Spouse name: Not on file   Number of children: Not on file   Years of education: Not on file   Highest education level: Not on file  Occupational History   Not on file  Tobacco Use   Smoking status: Never   Smokeless tobacco: Never  Vaping Use   Vaping Use: Never used  Substance and Sexual Activity   Alcohol use: Not Currently    Alcohol/week: 3.0 standard drinks    Types: 3 Standard drinks or equivalent per week   Drug use: Not Currently    Types: Marijuana    Comment: quit in December 2021   Sexual activity: Yes    Birth control/protection: None  Other Topics Concern   Not on file  Social History Narrative   Not on file   Social Determinants of Health  Financial Resource Strain: Medium Risk   Difficulty of Paying Living Expenses: Somewhat hard  Food Insecurity: Food Insecurity Present   Worried About Charity fundraiser in the Last Year: Not on file   [employer] of Food in the Last Year: Sometimes true  Transportation Needs: No Transportation Needs   Lack of Transportation (Medical): No   Lack of Transportation (Non-Medical): No  Physical Activity: Insufficiently Active   Days of Exercise per Week: 3 days   Minutes of Exercise per Session: 20 min  Stress: No Stress Concern Present   Feeling of Stress : Only a little  Social Connections: Moderately Isolated   Frequency of Communication with Friends and Family: Three times a week   Frequency of Social Gatherings with Friends and Family: Twice a week   Attends Religious Services: Never   Marine scientist or Organizations: No   Attends Music therapist: Never   Marital Status: Living with partner    Family History: Family History  Problem Relation Age of Onset   Diabetes Mother    Hypertension Mother    Hypertension Father    Diabetes Maternal Grandmother    Diabetes Paternal Grandmother      Allergies: Allergies  Allergen Reactions   Amoxicillin Anaphylaxis   Penicillins Anaphylaxis    Has patient had a PCN reaction causing immediate rash, facial/tongue/throat swelling, SOB or lightheadedness with hypotension: Yes Has patient had a PCN reaction causing severe rash involving mucus membranes or skin necrosis: No Has patient had a PCN reaction that required hospitalization No Has patient had a PCN reaction occurring within the last 10 years: No If all of the above answers are "NO", then may proceed with Cephalosporin use.    Aleve [Naproxen Sodium] Other (See Comments)    Chest pain    Medications Prior to Admission  Medication Sig Dispense Refill Last Dose   glucose blood (ACCU-CHEK GUIDE) test strip Use as instructed to check blood sugar 4 times daily 50 each 12 Past Week   metFORMIN (GLUCOPHAGE) 1000 MG tablet Take 1,000 mg by mouth at bedtime.   09/23/2021 at 0000   metFORMIN (GLUCOPHAGE) 500 MG tablet Take by mouth daily with breakfast.   09/23/2021 at 0900   pantoprazole (PROTONIX) 20 MG tablet Take 1 tablet (20 mg total) by mouth daily. 30 tablet 3 09/24/2021 at 1200   Accu-Chek Softclix Lancets lancets Use as instructed to check blood sugar 4 times daily 100 each 12    acetaminophen (TYLENOL) 500 MG tablet Take 2 tablets (1,000 mg total) by mouth every 6 (six) hours as needed. 60 tablet 0 More than a month   Blood Glucose Monitoring Suppl (ACCU-CHEK GUIDE ME) w/Device KIT 1 each by Does not apply route 4 (four) times daily. 1 kit 0    Blood Pressure Monitor MISC For regular home bp monitoring during pregnancy 1 each 0    prenatal vitamin w/FE, FA (PRENATAL 1 + 1) 27-1 MG TABS tablet Take 1 tablet by mouth daily at 12 noon.   09/23/2021 at 0000     Review of Systems   All systems reviewed and negative except as stated in HPI  Temperature 98.5 F (36.9 C), temperature source Oral, height _0  (1.6 m), weight 123.8 kg, last menstrual period 12/24/2020, unknown  if currently breastfeeding. General appearance: alert, cooperative, and no distress Lungs: clear to auscultation bilaterally Heart: regular rate and rhythm Abdomen: soft, non-tender; bowel sounds normal Pelvic: n/a Extremities: Homans sign is negative,  no sign of DVT DTR's wnl Presentation: cephalic Fetal monitoringBaseline: 135 bpm Uterine activity: irregular, mild     Prenatal labs: ABO, Rh: --/--/PENDING (10/28 2778) Antibody: PENDING (10/28 2423) Rubella: 1.22 (04/21 1103) RPR: Non Reactive (08/24 1202)  HBsAg: Negative (04/21 1103)  HIV: Non Reactive (08/24 1202)  GBS: --Henderson Cloud (10/05 1527)  A1C 6.7 at 10 weeks Genetic screening  normal Anatomy US wnl  Prenatal Transfer Tool  Maternal Diabetes: Yes:  Diabetes Type:  Insulin/Medication controlled Genetic Screening: Normal Maternal Ultrasounds/Referrals: Normal Fetal Ultrasounds or other Referrals:  Fetal echo Maternal Substance Abuse:  No Significant Maternal Medications:  Meds include: Other: Metformin Significant Maternal Lab Results: Group B Strep negative  Results for orders placed or performed during the hospital encounter of 09/25/21 (from the past 24 hour(s))  CBC   Collection Time: 09/25/21  8:38 AM  Result Value Ref Range   WBC 13.2 (H) 4.0 - 10.5 K/uL   RBC 4.35 3.87 - 5.11 MIL/uL   Hemoglobin 11.8 (L) 12.0 - 15.0 g/dL   HCT 36.7 36.0 - 46.0 %   MCV 84.4 80.0 - 100.0 fL   MCH 27.1 26.0 - 34.0 pg   MCHC 32.2 30.0 - 36.0 g/dL   RDW 14.0 11.5 - 15.5 %   Platelets 202 150 - 400 K/uL   nRBC 0.0 0.0 - 0.2 %  Type and screen   Collection Time: 09/25/21  8:38 AM  Result Value Ref Range   ABO/RH(D) PENDING    Antibody Screen PENDING    Sample Expiration      09/28/2021,2359 Performed at [facility], [place] [address] [place],  [address]     Patient Active Problem List   Diagnosis Date Noted   Right ovarian cyst 05/18/2021   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 [id number] at 19w2dhere for IOL for GDM A2/B on metformin  #Labor:Foley balloon and Cytotec to start IOL #Pain: IV pain medication PRN, epidural at pt request #FWB: Category I #ID:  GBS neg  #MOF: breast #MOC:IUD vs Phexxi #Circ:  unknown  LFatima Blank CNM  09/25/2021, 9:17 AM

## 2021-09-26 ENCOUNTER — Encounter (HOSPITAL_COMMUNITY)
Admission: AD | Disposition: A | Discharge status: Home / Self Care | Payer: Self-pay | Source: Home / Self Care | Attending: Obstetrics & Gynecology

## 2021-09-26 ENCOUNTER — Encounter (HOSPITAL_COMMUNITY): Payer: Self-pay | Admitting: Family Medicine

## 2021-09-26 DIAGNOSIS — Z3A39 39 weeks gestation of pregnancy: Secondary | ICD-10-CM | POA: Diagnosis not present

## 2021-09-26 DIAGNOSIS — O24425 Gestational diabetes mellitus in childbirth, controlled by oral hypoglycemic drugs: Secondary | ICD-10-CM | POA: Diagnosis not present

## 2021-09-26 DIAGNOSIS — O411290 Chorioamnionitis, unspecified trimester, not applicable or unspecified: Secondary | ICD-10-CM | POA: Diagnosis not present

## 2021-09-26 DIAGNOSIS — O411230 Chorioamnionitis, third trimester, not applicable or unspecified: Secondary | ICD-10-CM | POA: Diagnosis not present

## 2021-09-26 LAB — GLUCOSE, CAPILLARY
Glucose-Capillary: 80 mg/dL (ref 70–99)
Glucose-Capillary: 87 mg/dL (ref 70–99)
Glucose-Capillary: 94 mg/dL (ref 70–99)
Glucose-Capillary: 111 mg/dL — ABNORMAL HIGH (ref 70–99)

## 2021-09-26 SURGERY — Surgical Case
Anesthesia: Epidural

## 2021-09-26 MED ORDER — ENOXAPARIN SODIUM 60 MG/0.6ML IJ SOSY
60.0000 mg | PREFILLED_SYRINGE | INTRAMUSCULAR | Status: DC
  Administered 2021-09-27 – 2021-09-30 (×4): 60 mg via SUBCUTANEOUS
  Filled 2021-09-26 (×4): qty 0.6

## 2021-09-26 MED ORDER — WITCH HAZEL-GLYCERIN EX PADS: 1.0000 "application " | MEDICATED_PAD | CUTANEOUS | Status: DC | PRN

## 2021-09-26 MED ORDER — TRANEXAMIC ACID-NACL 1000-0.7 MG/100ML-% IV SOLN
INTRAVENOUS | Status: DC | PRN
  Administered 2021-09-26: 1000 mg via INTRAVENOUS

## 2021-09-26 MED ORDER — MORPHINE SULFATE (PF) 0.5 MG/ML IJ SOLN
INTRAMUSCULAR | Status: AC
  Filled 2021-09-26: qty 10

## 2021-09-26 MED ORDER — SCOPOLAMINE 1 MG/3DAYS TD PT72
1.0000 | MEDICATED_PATCH | Freq: Once | TRANSDERMAL | Status: AC
  Administered 2021-09-26: 1.5 mg via TRANSDERMAL

## 2021-09-26 MED ORDER — BUPIVACAINE HCL (PF) 0.5 % IJ SOLN
INTRAMUSCULAR | Status: DC | PRN
  Administered 2021-09-26: 10 mL

## 2021-09-26 MED ORDER — SODIUM CHLORIDE 0.9% FLUSH: 3.0000 mL | INTRAVENOUS | Status: DC | PRN

## 2021-09-26 MED ORDER — MORPHINE SULFATE (PF) 0.5 MG/ML IJ SOLN
INTRAMUSCULAR | Status: DC | PRN
  Administered 2021-09-26: 3 mg via EPIDURAL

## 2021-09-26 MED ORDER — CLINDAMYCIN PHOSPHATE 900 MG/50ML IV SOLN
INTRAVENOUS | Status: AC
  Filled 2021-09-26: qty 50

## 2021-09-26 MED ORDER — OXYCODONE HCL 5 MG PO TABS
5.0000 mg | ORAL_TABLET | ORAL | Status: DC | PRN
  Administered 2021-09-27 – 2021-09-28 (×6): 10 mg via ORAL
  Filled 2021-09-26 (×6): qty 2

## 2021-09-26 MED ORDER — TETANUS-DIPHTH-ACELL PERTUSSIS 5-2.5-18.5 LF-MCG/0.5 IM SUSY: 0.5000 mL | PREFILLED_SYRINGE | Freq: Once | INTRAMUSCULAR | Status: DC

## 2021-09-26 MED ORDER — PRENATAL MULTIVITAMIN CH
1.0000 | ORAL_TABLET | Freq: Every day | ORAL | Status: DC
  Administered 2021-09-27 – 2021-09-30 (×4): 1 via ORAL
  Filled 2021-09-26 (×4): qty 1

## 2021-09-26 MED ORDER — SIMETHICONE 80 MG PO CHEW
80.0000 mg | CHEWABLE_TABLET | Freq: Three times a day (TID) | ORAL | Status: DC
  Administered 2021-09-26 – 2021-09-30 (×10): 80 mg via ORAL
  Filled 2021-09-26 (×11): qty 1

## 2021-09-26 MED ORDER — SODIUM CHLORIDE 0.9 % IV SOLN
500.0000 mg | INTRAVENOUS | Status: AC
  Administered 2021-09-26: 500 mg via INTRAVENOUS

## 2021-09-26 MED ORDER — DIPHENHYDRAMINE HCL 25 MG PO CAPS: 25.0000 mg | ORAL_CAPSULE | ORAL | Status: DC | PRN

## 2021-09-26 MED ORDER — SENNOSIDES-DOCUSATE SODIUM 8.6-50 MG PO TABS
2.0000 | ORAL_TABLET | Freq: Every day | ORAL | Status: DC
  Administered 2021-09-27 – 2021-09-30 (×3): 2 via ORAL
  Filled 2021-09-26 (×4): qty 2

## 2021-09-26 MED ORDER — NALBUPHINE HCL 10 MG/ML IJ SOLN: 5.0000 mg | Freq: Once | INTRAMUSCULAR | Status: DC | PRN

## 2021-09-26 MED ORDER — OXYTOCIN-SODIUM CHLORIDE 30-0.9 UT/500ML-% IV SOLN
INTRAVENOUS | Status: AC
  Filled 2021-09-26: qty 500

## 2021-09-26 MED ORDER — SOD CITRATE-CITRIC ACID 500-334 MG/5ML PO SOLN: 30.0000 mL | ORAL | Status: DC

## 2021-09-26 MED ORDER — GENTAMICIN SULFATE 40 MG/ML IJ SOLN
5.0000 mg/kg | INTRAMUSCULAR | Status: AC
  Administered 2021-09-27: 410 mg via INTRAVENOUS
  Filled 2021-09-26: qty 10.25

## 2021-09-26 MED ORDER — COCONUT OIL OIL: 1.0000 "application " | TOPICAL_OIL | Status: DC | PRN

## 2021-09-26 MED ORDER — KETOROLAC TROMETHAMINE 30 MG/ML IJ SOLN
30.0000 mg | Freq: Four times a day (QID) | INTRAMUSCULAR | Status: AC | PRN
  Administered 2021-09-26 (×2): 30 mg via INTRAVENOUS
  Filled 2021-09-26: qty 1

## 2021-09-26 MED ORDER — ACETAMINOPHEN 500 MG PO TABS
1000.0000 mg | ORAL_TABLET | Freq: Once | ORAL | Status: AC
  Administered 2021-09-26: 1000 mg via ORAL
  Filled 2021-09-26: qty 2

## 2021-09-26 MED ORDER — OXYTOCIN-SODIUM CHLORIDE 30-0.9 UT/500ML-% IV SOLN: 1.0000 m[IU]/min | INTRAVENOUS | Status: DC

## 2021-09-26 MED ORDER — POVIDONE-IODINE 10 % EX SWAB: 2.0000 "application " | Freq: Once | CUTANEOUS | Status: DC

## 2021-09-26 MED ORDER — ONDANSETRON HCL 4 MG/2ML IJ SOLN: 4.0000 mg | Freq: Three times a day (TID) | INTRAMUSCULAR | Status: DC | PRN

## 2021-09-26 MED ORDER — PHENYLEPHRINE 40 MCG/ML (10ML) SYRINGE FOR IV PUSH (FOR BLOOD PRESSURE SUPPORT)
PREFILLED_SYRINGE | INTRAVENOUS | Status: AC
  Filled 2021-09-26: qty 10

## 2021-09-26 MED ORDER — ACETAMINOPHEN 500 MG PO TABS
1000.0000 mg | ORAL_TABLET | Freq: Four times a day (QID) | ORAL | Status: DC
  Administered 2021-09-26 – 2021-09-30 (×14): 1000 mg via ORAL
  Filled 2021-09-26 (×15): qty 2

## 2021-09-26 MED ORDER — PHENYLEPHRINE HCL (PRESSORS) 10 MG/ML IV SOLN
INTRAVENOUS | Status: DC | PRN
  Administered 2021-09-26 (×3): 50 ug via INTRAVENOUS
  Administered 2021-09-26 (×2): 80 ug via INTRAVENOUS
  Administered 2021-09-26 (×2): 50 ug via INTRAVENOUS
  Administered 2021-09-26 (×2): 100 ug via INTRAVENOUS

## 2021-09-26 MED ORDER — KETOROLAC TROMETHAMINE 30 MG/ML IJ SOLN
INTRAMUSCULAR | Status: AC
  Filled 2021-09-26: qty 1

## 2021-09-26 MED ORDER — NALBUPHINE HCL 10 MG/ML IJ SOLN
5.0000 mg | INTRAMUSCULAR | Status: DC | PRN
Start: 2021-09-26 — End: 2021-09-30

## 2021-09-26 MED ORDER — LIDOCAINE-EPINEPHRINE (PF) 2 %-1:200000 IJ SOLN
INTRAMUSCULAR | Status: AC
  Filled 2021-09-26: qty 20

## 2021-09-26 MED ORDER — ONDANSETRON HCL 4 MG/2ML IJ SOLN
INTRAMUSCULAR | Status: AC
  Filled 2021-09-26: qty 2

## 2021-09-26 MED ORDER — NALBUPHINE HCL 10 MG/ML IJ SOLN: 5.0000 mg | INTRAMUSCULAR | Status: DC | PRN

## 2021-09-26 MED ORDER — SCOPOLAMINE 1 MG/3DAYS TD PT72
MEDICATED_PATCH | TRANSDERMAL | Status: AC
  Filled 2021-09-26: qty 1

## 2021-09-26 MED ORDER — STERILE WATER FOR IRRIGATION IR SOLN
Status: DC | PRN
  Administered 2021-09-26: 1000 mL

## 2021-09-26 MED ORDER — DIPHENHYDRAMINE HCL 50 MG/ML IJ SOLN: 12.5000 mg | INTRAMUSCULAR | Status: DC | PRN

## 2021-09-26 MED ORDER — DIBUCAINE (PERIANAL) 1 % EX OINT: 1.0000 "application " | TOPICAL_OINTMENT | CUTANEOUS | Status: DC | PRN

## 2021-09-26 MED ORDER — NALOXONE HCL 0.4 MG/ML IJ SOLN: 0.4000 mg | INTRAMUSCULAR | Status: DC | PRN

## 2021-09-26 MED ORDER — SOD CITRATE-CITRIC ACID 500-334 MG/5ML PO SOLN
30.0000 mL | ORAL | Status: AC
  Administered 2021-09-26: 30 mL via ORAL

## 2021-09-26 MED ORDER — ONDANSETRON HCL 4 MG/2ML IJ SOLN
INTRAMUSCULAR | Status: DC | PRN
  Administered 2021-09-26: 4 mg via INTRAVENOUS

## 2021-09-26 MED ORDER — MEASLES, MUMPS & RUBELLA VAC IJ SOLR: 0.5000 mL | Freq: Once | INTRAMUSCULAR | Status: DC

## 2021-09-26 MED ORDER — TRANEXAMIC ACID-NACL 1000-0.7 MG/100ML-% IV SOLN
INTRAVENOUS | Status: AC
  Filled 2021-09-26: qty 100

## 2021-09-26 MED ORDER — SODIUM CHLORIDE 0.9 % IV SOLN
INTRAVENOUS | Status: AC
  Filled 2021-09-26: qty 500

## 2021-09-26 MED ORDER — DEXTROSE 5 % IV SOLN
1.0000 ug/kg/h | INTRAVENOUS | Status: DC | PRN
  Filled 2021-09-26: qty 5

## 2021-09-26 MED ORDER — OXYTOCIN-SODIUM CHLORIDE 30-0.9 UT/500ML-% IV SOLN: 2.5000 [IU]/h | INTRAVENOUS | Status: AC

## 2021-09-26 MED ORDER — DIPHENHYDRAMINE HCL 25 MG PO CAPS: 25.0000 mg | ORAL_CAPSULE | Freq: Four times a day (QID) | ORAL | Status: DC | PRN

## 2021-09-26 MED ORDER — GENTAMICIN SULFATE 40 MG/ML IJ SOLN
5.0000 mg/kg | INTRAVENOUS | Status: AC
  Administered 2021-09-26: 410 mg via INTRAVENOUS
  Filled 2021-09-26 (×2): qty 10.25

## 2021-09-26 MED ORDER — KETOROLAC TROMETHAMINE 30 MG/ML IJ SOLN: 30.0000 mg | Freq: Four times a day (QID) | INTRAMUSCULAR | Status: AC | PRN

## 2021-09-26 MED ORDER — LIDOCAINE-EPINEPHRINE (PF) 2 %-1:200000 IJ SOLN
INTRAMUSCULAR | Status: DC | PRN
  Administered 2021-09-26 (×4): 5 mL via EPIDURAL

## 2021-09-26 MED ORDER — OXYTOCIN-SODIUM CHLORIDE 30-0.9 UT/500ML-% IV SOLN
INTRAVENOUS | Status: DC | PRN
  Administered 2021-09-26: 400 mL via INTRAVENOUS

## 2021-09-26 MED ORDER — SODIUM CHLORIDE 0.9 % IR SOLN
Status: DC | PRN
  Administered 2021-09-26: 1000 mL

## 2021-09-26 MED ORDER — CLINDAMYCIN PHOSPHATE 900 MG/50ML IV SOLN
900.0000 mg | Freq: Three times a day (TID) | INTRAVENOUS | Status: DC
  Administered 2021-09-26 (×2): 900 mg via INTRAVENOUS
  Filled 2021-09-26: qty 50

## 2021-09-26 MED ORDER — CLINDAMYCIN PHOSPHATE 900 MG/50ML IV SOLN
900.0000 mg | Freq: Three times a day (TID) | INTRAVENOUS | Status: AC
  Administered 2021-09-26 – 2021-09-27 (×3): 900 mg via INTRAVENOUS
  Filled 2021-09-26 (×3): qty 50

## 2021-09-26 MED ORDER — SIMETHICONE 80 MG PO CHEW
80.0000 mg | CHEWABLE_TABLET | ORAL | Status: DC | PRN
  Administered 2021-09-27 – 2021-09-30 (×3): 80 mg via ORAL
  Filled 2021-09-26 (×2): qty 1

## 2021-09-26 MED ORDER — MEPERIDINE HCL 25 MG/ML IJ SOLN: 6.2500 mg | INTRAMUSCULAR | Status: DC | PRN

## 2021-09-26 MED ORDER — LACTATED RINGERS IV SOLN: INTRAVENOUS | Status: DC

## 2021-09-26 MED ORDER — LACTATED RINGERS IV SOLN: INTRAVENOUS | Status: DC | PRN

## 2021-09-26 MED ORDER — MENTHOL 3 MG MT LOZG: 1.0000 | LOZENGE | OROMUCOSAL | Status: DC | PRN

## 2021-09-26 MED ORDER — FENTANYL CITRATE (PF) 100 MCG/2ML IJ SOLN
INTRAMUSCULAR | Status: AC
  Filled 2021-09-26: qty 2

## 2021-09-26 MED ORDER — BUPIVACAINE HCL (PF) 0.5 % IJ SOLN
INTRAMUSCULAR | Status: AC
  Filled 2021-09-26: qty 30

## 2021-09-26 MED ORDER — TERBUTALINE SULFATE 1 MG/ML IJ SOLN: 0.2500 mg | Freq: Once | INTRAMUSCULAR | Status: DC | PRN

## 2021-09-26 SURGICAL SUPPLY — 33 items
BARRIER ADHS 3X4 INTERCEED (GAUZE/BANDAGES/DRESSINGS) IMPLANT
CHLORAPREP W/TINT 26ML (MISCELLANEOUS) ×4 IMPLANT
CLAMP CORD UMBIL (MISCELLANEOUS) IMPLANT
CLIP FILSHIE TUBAL LIGA STRL (Clip) IMPLANT
CLOTH BEACON ORANGE TIMEOUT ST (SAFETY) ×2 IMPLANT
DRSG OPSITE POSTOP 4X10 (GAUZE/BANDAGES/DRESSINGS) ×2 IMPLANT
ELECT REM PT RETURN 9FT ADLT (ELECTROSURGICAL) ×2
ELECTRODE REM PT RTRN 9FT ADLT (ELECTROSURGICAL) ×1 IMPLANT
EXTRACTOR VACUUM KIWI (MISCELLANEOUS) ×2 IMPLANT
GLOVE BIO SURGEON STRL SZ 6.5 (GLOVE) ×2 IMPLANT
GLOVE BIOGEL PI IND STRL 7.0 (GLOVE) ×2 IMPLANT
GLOVE BIOGEL PI INDICATOR 7.0 (GLOVE) ×2
GOWN STRL REUS W/TWL LRG LVL3 (GOWN DISPOSABLE) ×4 IMPLANT
HOVERMATT SINGLE USE (MISCELLANEOUS) ×2 IMPLANT
KIT ABG SYR 3ML LUER SLIP (SYRINGE) IMPLANT
NEEDLE HYPO 22GX1.5 SAFETY (NEEDLE) IMPLANT
NEEDLE HYPO 25X5/8 SAFETYGLIDE (NEEDLE) IMPLANT
NS IRRIG 1000ML POUR BTL (IV SOLUTION) ×2 IMPLANT
PACK C SECTION WH (CUSTOM PROCEDURE TRAY) ×2 IMPLANT
PAD OB MATERNITY 4.3X12.25 (PERSONAL CARE ITEMS) ×2 IMPLANT
PENCIL SMOKE EVAC W/HOLSTER (ELECTROSURGICAL) ×2 IMPLANT
RETRACTOR TRAXI PANNICULUS (MISCELLANEOUS) ×2 IMPLANT
RETRACTOR WND ALEXIS 25 LRG (MISCELLANEOUS) IMPLANT
RTRCTR WOUND ALEXIS 25CM LRG (MISCELLANEOUS)
SUT VIC AB 0 CT1 36 (SUTURE) ×12 IMPLANT
SUT VIC AB 2-0 CT1 27 (SUTURE) ×2
SUT VIC AB 2-0 CT1 TAPERPNT 27 (SUTURE) ×1 IMPLANT
SUT VIC AB 4-0 PS2 27 (SUTURE) ×2 IMPLANT
SYR CONTROL 10ML LL (SYRINGE) IMPLANT
TOWEL OR 17X24 6PK STRL BLUE (TOWEL DISPOSABLE) ×2 IMPLANT
TRAXI PANNICULUS RETRACTOR (MISCELLANEOUS) ×2
TRAY FOLEY W/BAG SLVR 14FR LF (SET/KITS/TRAYS/PACK) IMPLANT
WATER STERILE IRR 1000ML POUR (IV SOLUTION) ×2 IMPLANT

## 2021-09-26 NOTE — Progress Notes (Signed)
Patient ID: Isabel Conner, female   DOB: 12/15/1995, 26 y.o.   MRN: [id number] Labor Progress Note Isabel Conner is a 25 y.o. [id number] at [redacted]w[redacted]d admitted for IOL due to [facility] on metformin.  S: Resting comfortably in bed. Feeling a little warm and has a mild headache.  O:  BP 121/67   Pulse 90   Temp 99.2 F (37.3 C) (Oral)   Ht 5\' 3"  (1.6 m)   Wt 123.8 kg   LMP 12/24/2020 (Exact Date)   SpO2 99%   BMI 48.34 kg/m  EFM: baseline 140/ moderate variability/accelerations present/no decels  CVE: Dilation: 6 Effacement (%): 70 Cervical Position: Posterior Station: -3 Presentation: Vertex Exam by:: Dr. [email or web address]   A&P: 25 y.o. 34 [redacted]w[redacted]d admitted for IOL due to [facility] on metformin. #Labor: Progressing well. Continue to titrate pitocin to adequate MVUs as tolerated #Pain: Well controlled, has epidural #FWB: Category I strip #GBS negative #Headache: will give Tylenol and reassess. Monitoring BP and temperature.  [facility], MD 12:18 AM

## 2021-09-26 NOTE — Discharge Summary (Signed)
Postpartum Discharge Summary   Patient Name: Isabel Conner DOB: [birth year] MRN: [id number]  Date of admission: 09/25/2021 Delivery date:09/26/2021  Delivering provider: Woodroe Mode  Date of discharge: 09/30/2021  Admitting diagnosis: Gestational diabetes mellitus, class A2 [O24.419] Intrauterine pregnancy: [redacted]w[redacted]d    Secondary diagnosis:  Principal Problem:   Cesarean delivery delivered Active Problems:   Obesity   Supervision of high-risk pregnancy   Gestational diabetes mellitus, class A2/B   Chorioamnionitis  Additional problems: Triple I with persistent pp fever    Discharge diagnosis: Term Pregnancy Delivered                                              Post partum procedures: blood cultures x2 Augmentation: AROM, Pitocin, Cytotec, and IP Foley Complications: Intrauterine Inflammation or infection (Chorioamniotis)  Hospital course: Induction of Labor With Cesarean Section   25y.o. yo [id number] admitted to the hospital 09/25/2021 for induction of labor due to A2/B GDM. Patient had a labor course significant for progression to 5.5 cm dilation after augmentation noted above. She developed triple I and failed to progress after > 10 hours. The patient went for cesarean section due to Arrest of Dilation. Delivery details are as follows: Membrane Rupture Time/Date: 6:41 PM ,09/25/2021   Delivery Method:C-Section, Vacuum Assisted  Details of operation can be found in separate operative Note.  Patient had an uncomplicated postpartum course. She is ambulating, tolerating a regular diet, passing flatus, and urinating well.  Patient is discharged home in stable condition on 09/30/21.      Newborn Data: Birth date:09/26/2021  Birth time:11:23 AM  Gender:Female  Living status:Living  Apgars:5 ,9  Weight:3800 g                                Magnesium Sulfate received: No BMZ received: No Rhophylac: N/A MMR: N/A - Immune  T-DaP: Given prenatally Flu:  Yes Transfusion: No   Physical exam  Vitals:   09/29/21 0559 09/29/21 1500 09/29/21 2010 09/30/21 0508  BP: 95/63 125/79 120/71 124/78  Pulse: 75 91 84 77  Resp: 18 17 16 18   Temp: 97.8 F (36.6 C) 99.4 F (37.4 C) 98.7 F (37.1 C) 98.6 F (37 C)  TempSrc: Oral Oral Oral Oral  SpO2: 99%  95% 95%  Weight:      Height:       General: alert, cooperative, and no distress Lochia: appropriate Uterine Fundus: firm Incision: Healing well with no significant drainage, No significant erythema, Dressing is clean, dry, and intact DVT Evaluation: No evidence of DVT seen on physical exam.  Labs: Lab Results  Component Value Date   WBC 9.3 09/30/2021   HGB 9.2 (L) 09/30/2021   HCT 28.6 (L) 09/30/2021   MCV 83.9 09/30/2021   PLT 192 09/30/2021   CMP Latest Ref Rng & Units 09/30/2021  Glucose 70 - 99 mg/dL 94  BUN 6 - 20 mg/dL 10  Creatinine 0.44 - 1.00 mg/dL 0.50  Sodium 135 - 145 mmol/L 135  Potassium 3.5 - 5.1 mmol/L 3.3(L)  Chloride 98 - 111 mmol/L 104  CO2 22 - 32 mmol/L 23  Calcium 8.9 - 10.3 mg/dL 8.1(L)  Total Protein 6.5 - 8.1 g/dL 5.1(L)  Total Bilirubin 0.3 - 1.2 mg/dL 0.6  Alkaline  Phos 38 - 126 U/L 70  AST 15 - 41 U/L 35  ALT 0 - 44 U/L 12   Edinburgh Score: No flowsheet data found.   After visit meds:  Allergies as of 09/30/2021       Reactions   Amoxicillin Anaphylaxis   Penicillins Anaphylaxis   Has patient had a PCN reaction causing immediate rash, facial/tongue/throat swelling, SOB or lightheadedness with hypotension: Yes Has patient had a PCN reaction causing severe rash involving mucus membranes or skin necrosis: No Has patient had a PCN reaction that required hospitalization No Has patient had a PCN reaction occurring within the last 10 years: No If all of the above answers are "NO", then may proceed with Cephalosporin use.   Aleve [naproxen Sodium] Other (See Comments)   Chest pain. Pt states she can take Iburprofen        Medication List      STOP taking these medications    Accu-Chek Guide Me w/Device Kit   Accu-Chek Guide test strip Generic drug: glucose blood   Accu-Chek Softclix Lancets lancets   Blood Pressure Monitor Misc   metFORMIN 1000 MG tablet Commonly known as: GLUCOPHAGE   metFORMIN 500 MG tablet Commonly known as: GLUCOPHAGE       TAKE these medications    acetaminophen 500 MG tablet Commonly known as: TYLENOL Take 2 tablets (1,000 mg total) by mouth every 6 (six) hours as needed.   docusate sodium 250 MG capsule Commonly known as: COLACE Take 1 capsule (250 mg total) by mouth daily.   ibuprofen 600 MG tablet Commonly known as: ADVIL Take 1 tablet (600 mg total) by mouth every 6 (six) hours as needed for mild pain.   oxyCODONE-acetaminophen 5-325 MG tablet Commonly known as: PERCOCET/ROXICET Take 1 tablet by mouth every 6 (six) hours as needed for severe pain.   pantoprazole 20 MG tablet Commonly known as: Protonix Take 1 tablet (20 mg total) by mouth daily.   prenatal vitamin w/FE, FA 27-1 MG Tabs tablet Take 1 tablet by mouth daily at 12 noon.         Discharge home in stable condition Infant Feeding: Bottle Infant Disposition:home with mother Discharge instruction: per After Visit Summary and Postpartum booklet. Activity: Advance as tolerated. Pelvic rest for 6 weeks.  Diet: carb modified diet Future Appointments: Future Appointments  Date Time Provider [place]  10/05/2021  1:30 PM Florian Buff, MD [facility]  10/28/2021  1:30 PM Myrtis Ser, CNM [facility]   Follow up Visit:  Follow-up Information     [facility] Follow up.   Specialty: Obstetrics and Gynecology Why: In 1 week for a pp appointment Contact information: [address] [place] [phone]               Message sent to [facility] by Dr. Gwenlyn Perking on 09/26/21.   Please schedule this patient for a In person postpartum visit  in 4 weeks with the following provider: MD. Additional Postpartum F/U: 2 hour GTT and Incision check 1 week  High risk pregnancy complicated by: GDM Delivery mode:  C-Section, Vacuum Assisted  Anticipated Birth Control:   Phexxi   09/30/2021 Wende Mott, CNM

## 2021-09-26 NOTE — Progress Notes (Signed)
Maternal fever 100.85F, otherwise reassuring FHT and patient is comfortable, feeling well.   Plan: Given tylenol 1000mg  once. If additional fever, plan to treat as presumed Triple I.  , DO

## 2021-09-26 NOTE — Transfer of Care (Signed)
Immediate Anesthesia Transfer of Care Note  Patient: Isabel Conner  Procedure(s) Performed: CESAREAN SECTION  Patient Location: PACU  Anesthesia Type:Epidural  Level of Consciousness: awake, alert , oriented and patient cooperative  Airway & Oxygen Therapy: Patient Spontanous Breathing  Post-op Assessment: Report given to RN and Post -op Vital signs reviewed and stable  Post vital signs: Reviewed and stable  Last Vitals:  Vitals Value Taken Time  BP    Temp    Pulse    Resp    SpO2      Last Pain:  Vitals:   09/26/21 1000  TempSrc:   PainSc: 0-No pain         Complications: No notable events documented.

## 2021-09-26 NOTE — Progress Notes (Signed)
Labor Progress Note Isabel Conner is a 25 y.o. Q6V7846 at [redacted]w[redacted]d presented for IOL due to GDMA2/B, now with triple I.   S: Doing well. Feeling more contraction pain at this time. Thinks her fever is breaking. No other concerns at this time.  O:  BP 123/71   Pulse (!) 103   Temp (!) 101.2 F (38.4 C) (Oral)   Resp 18   Ht 5\' 3"  (1.6 m)   Wt 123.8 kg   LMP 12/24/2020 (Exact Date)   SpO2 99%   BMI 48.34 kg/m   EFM: Baseline 155, min variability, no accels, no decels   CVE: Dilation: 5.5 Effacement (%): 70 Cervical Position: Posterior Station: -3 Presentation: Vertex Exam by:: Dr. [email or web address]   A&P: 25 y.o. G5P0040 [redacted]w[redacted]d   #Labor: SVE remains unchanged with high fetal station. Has been the same exam for over 10 hours. Recommended cesarean delivery after discussion with Dr. [redacted]w[redacted]d, MD.   The risks of surgery were discussed with the patient including but were not limited to: bleeding which may require transfusion or reoperation; infection which may require antibiotics; injury to bowel, bladder, ureters or other surrounding organs; injury to the fetus; need for additional procedures including hysterectomy in the event of a life-threatening hemorrhage; formation of adhesions; placental abnormalities with subsequent pregnancies; incisional problems; thromboembolic phenomenon and other postoperative/anesthesia complications.  The patient concurred with the proposed plan, giving informed written consent for the procedure.  Anesthesia and OR aware. Preoperative prophylactic antibiotics and SCDs ordered on call to the OR.  To OR when ready.  #Pain: Epidural  #FWB: Cat 2 due to minimal variability #GBS negative  #Triple I: Continue Gent/Clinda for 24 hours post-delivery   Scheryl Darter, MD 10:18 AM

## 2021-09-26 NOTE — Op Note (Signed)
Isabel Conner  PROCEDURE DATE: 09/26/2021  PREOPERATIVE DIAGNOSES: Intrauterine pregnancy at [redacted]w[redacted]d weeks gestation; failure to progress: arrest of dilation, chorioamnionitis   POSTOPERATIVE DIAGNOSES: The same  PROCEDURE: Primary Low Transverse Cesarean Section  SURGEON:  Dr. Scheryl Darter   ASSISTANT:  Dr. Evalina Field   ANESTHESIOLOGY TEAM: Anesthesiologist: Kaylyn Layer, MD; Bethena Midget, MD  INDICATIONS: Isabel Conner is a 25 y.o. [id number] at [redacted]w[redacted]d here for cesarean section secondary to the indications listed under preoperative diagnoses; please see preoperative note for further details.  The risks of cesarean section were discussed with the patient including but were not limited to: bleeding which may require transfusion or reoperation; infection which may require antibiotics; injury to bowel, bladder, ureters or other surrounding organs; injury to the fetus; need for additional procedures including hysterectomy in the event of a life-threatening hemorrhage; placental abnormalities wth subsequent pregnancies, incisional problems, thromboembolic phenomenon and other postoperative/anesthesia complications.   The patient concurred with the proposed plan, giving informed written consent for the procedure.    FINDINGS:  Viable female infant in cephalic presentation.  Apgars 5 and 9.  Clear amniotic fluid.  Intact placenta, three vessel cord.  ANESTHESIA: Epidural  INTRAVENOUS FLUIDS: 1000 ml   ESTIMATED BLOOD LOSS: 416 ml URINE OUTPUT:  75 ml SPECIMENS: Placenta sent to L&D COMPLICATIONS: None immediate  PROCEDURE IN DETAIL:  The patient preoperatively received intravenous antibiotics and had sequential compression devices applied to her lower extremities.  She was then taken to the operating room where the epidural anesthesia was dosed up to surgical level and was found to be adequate. She was then placed in a dorsal supine position with a leftward tilt, and prepped and draped in a  sterile manner.  A foley catheter was in place.    After an adequate timeout was performed, a Pfannenstiel skin incision was made with scalpel and carried through to the underlying layer of fascia. The fascia was incised in the midline, and this incision was extended bilaterally using the Mayo scissors.  Kocher clamps were applied to the superior aspect of the fascial incision and the underlying rectus muscles were dissected off bluntly and sharply.  A similar process was carried out on the inferior aspect of the fascial incision. The rectus muscles were separated in the midline and the peritoneum was entered bluntly. The Alexis self-retaining retractor was introduced into the abdominal cavity.    Attention was turned to the lower uterine segment where a low transverse hysterotomy was made with a scalpel and extended bilaterally bluntly.  The infant was successfully delivered with vacuum assistance, the cord was clamped and cut after 15 second delay and the infant was handed over to the awaiting neonatology team.  Attempted to obtain cord gas but unable to retrieve sample.  Uterine massage was then administered, and the placenta delivered intact with a three-vessel cord. The uterus was then cleared of clots and debris.  The hysterotomy was closed with 0 Vicryl in a running locked fashion, and an imbricating layer was also placed with 0 Vicryl.  The pelvis was cleared of all clot and debris. Hemostasis was confirmed on all surfaces.  The retractor was removed.  The peritoneum was closed with a 0 Vicryl running stitch. The fascia was then closed using 0 Vicryl in a running fashion.  The subcutaneous layer was irrigated, re-approximated with 2-0 plain gut running stitch, and the skin was closed with a 4-0 Vicryl subcuticular stitch. The patient tolerated the procedure well. Sponge, instrument  and needle counts were correct x 3.  She was taken to the recovery room in stable condition.   Evalina Field, MD OB  Fellow  Faculty Practice

## 2021-09-26 NOTE — Lactation Note (Signed)
This note was copied from a baby's chart. Lactation Consultation Note  Patient Name: Isabel Conner DEYCX'K Date: 09/26/2021 Reason for consult: Initial assessment;Primapara;1st time breastfeeding;Term;Maternal endocrine disorder Age:27 hours  Initial visit to 7 hours old infant of a P1 mother. Mother is attempting latch. LC assisted with alignment, support pillows, and latch. Infant is able to latch, observed bursts of sucks and then comes off breast. Nipples are short shafted and breast tissue is dense. Use hand pump with 24-mm, collected and fingerfed ~3 mL of EBM. Per mother's request, DEBP has been set up. Mother is supplementing with donor milk. LC demonstrated pacing, upright position and frequent burping. Baby took ~8 mL.  May need a nipple shield.  Reviewed normal newborn behavior during first 24h, expected output, tummy size and feeding frequency.    Plan: 1-Skin to skin, aim for a deep, comfortable latch and breastfeed on demand or 8-12 times in 24h period. 2-Supplement as needed following guidelines, pacing and upright position.  3-Encouraged maternal rest, hydration and food intake.  4-Pump for stimulation.  Contact LC as needed for feeds/support/concerns/questions. All questions answered at this time. Provided Lactation services brochure and promoted [employer] booklet information.     Maternal Data Has patient been taught Hand Expression?: Yes Does the patient have breastfeeding experience prior to this delivery?: No  Feeding Mother's Current Feeding Choice: Breast Milk and Donor Milk Nipple Type: Slow - flow  LATCH Score Latch: Repeated attempts needed to sustain latch, nipple held in mouth throughout feeding, stimulation needed to elicit sucking reflex.  Audible Swallowing: A few with stimulation  Type of Nipple: Everted at rest and after stimulation (short shafted, dense tissue)  Comfort (Breast/Nipple): Soft / non-tender  Hold (Positioning): Assistance needed to  correctly position infant at breast and maintain latch.  LATCH Score: 7   Lactation Tools Discussed/Used Tools: Pump;Flanges Flange Size: 24 Breast pump type: Double-Electric Breast Pump;Manual Pump Education: Setup, frequency, and cleaning;Milk Storage Reason for Pumping: stimulation and supplementation Pumping frequency: q3 Pumped volume: 3 mL  Interventions Interventions: Breast feeding basics reviewed;Assisted with latch;Skin to skin;Breast massage;Hand express;Pre-pump if needed;Adjust position;Support pillows;Expressed milk;Hand pump;DEBP;LC Services brochure;Education;Pace feeding  Discharge Pump: Manual;DEBP WIC Program: Yes  Consult Status Consult Status: Follow-up Date: 09/27/21 Follow-up type: In-patient    Gracelin Weisberg A Higuera Ancidey 09/26/2021, 6:53 PM

## 2021-09-26 NOTE — Progress Notes (Signed)
Noted to have recurrent late deceleration despite position changes and IV bolus. Pit stopped with slow improvement. FHT Baseline 150/mod var/shallow late decels diminishing. Cervix unchanged from earlier (checked as patient reported more pressure).   Maternal fever persistent despite tylenol. Fetal baseline has increased slowly to 150-155, previously 120-130 earlier in the evening. Will treat as presumed Triple I with gent/clinda. Reported anaphylactic allergy to Penicillin.   Cont to monitor closely.   Allayne Stack, DO

## 2021-09-27 ENCOUNTER — Encounter (HOSPITAL_COMMUNITY): Payer: Self-pay | Admitting: Family Medicine

## 2021-09-27 LAB — CBC
HCT: 30.2 % — ABNORMAL LOW (ref 36.0–46.0)
Hemoglobin: 10 g/dL — ABNORMAL LOW (ref 12.0–15.0)
MCH: 28 pg (ref 26.0–34.0)
MCHC: 33.1 g/dL (ref 30.0–36.0)
MCV: 84.6 fL (ref 80.0–100.0)
Platelets: 126 10*3/uL — ABNORMAL LOW (ref 150–400)
RBC: 3.57 MIL/uL — ABNORMAL LOW (ref 3.87–5.11)
RDW: 14.2 % (ref 11.5–15.5)
WBC: 15.1 10*3/uL — ABNORMAL HIGH (ref 4.0–10.5)
nRBC: 0 % (ref 0.0–0.2)

## 2021-09-27 LAB — GLUCOSE, CAPILLARY: Glucose-Capillary: 100 mg/dL — ABNORMAL HIGH (ref 70–99)

## 2021-09-27 NOTE — Progress Notes (Signed)
Nurse called to room patient had dislodged IV on trip to BR. Patient refusing new IV at this time. Need for IV antibiotics discussed and patient agreed to new IV.  19:33 new saline lock placed

## 2021-09-27 NOTE — Progress Notes (Signed)
Subjective: Postpartum Day 1: Cesarean Delivery Patient reports incisional pain, + flatus, and no problems voiding.    Objective: Vital signs in last 24 hours: Temp:  [98.4 F (36.9 C)-102.9 F (39.4 C)] 98.4 F (36.9 C) (10/30 1552) Pulse Rate:  [99-109] 102 (10/30 1552) Resp:  [18] 18 (10/30 1552) BP: (116-131)/(66-75) 131/66 (10/30 1552) SpO2:  [99 %-100 %] 100 % (10/30 0610)  Physical Exam:  General: alert, cooperative, appears stated age, fatigued, and morbidly obese Lochia: appropriate Uterine Fundus: firm Incision: healing well, no significant drainage DVT Evaluation: No evidence of DVT seen on physical exam.  Recent Labs    09/25/21 0838 09/27/21 0354  HGB 11.8* 10.0*  HCT 36.7 30.2*  CBG (last 3)  Recent Labs    09/26/21 0808 09/26/21 1241 09/27/21 0612  GLUCAP 94 111* 100*     Assessment/Plan: Status post Cesarean section. Doing well postoperatively.  Continue current care. Needs PP GTT at 6 weeks.  Isabel Conner 09/27/2021, 7:12 PM

## 2021-09-27 NOTE — Lactation Note (Signed)
This note was copied from a baby's chart. Lactation Consultation Note  Patient Name: Isabel Conner RJJOA'C Date: 09/27/2021 Reason for consult: Follow-up assessment;Term;Difficult latch Age:27 hours  LC in to room for follow up. Mother states she has not been able to latch infant. Parents report feeding ~15 mL of donor milk.  Reinforced pumping for proper stimulation. Reviewed adequate feeding volume per age. Encouraged to feed infant with hunger cues. Urged FOB to pace when bottlefeeding, burp with frequency and keep baby in upright position.  Discussed normal infant behavior, clusterfeeding, normal output. Talked about milk coming into volume. Urged to use EBM to nipples, air-dry and apply coconut oil.  Feeding plan:  1-Feeding on demand or 8-12 times in 24h period. 2-Pump every 3 hours for stimulation and supplementation. 3-Follow feeding volume per age. 4-Encouraged maternal rest, hydration and food intake.   Contact LC as needed for feeds/support/concerns/questions. All questions answered at this time.    Feeding Mother's Current Feeding Choice: Breast Milk and Donor Milk Nipple Type: Slow - flow  Lactation Tools Discussed/Used Tools: Pump Breast pump type: Double-Electric Breast Pump;Manual Pumping frequency: encouraged q3 Pumped volume:  (drops, per mother)  Interventions Interventions: Pace feeding;Education;Expressed milk;DEBP;Hand pump  Consult Status Consult Status: Follow-up Date: 09/28/21 Follow-up type: In-patient    Opaline Reyburn A Higuera Ancidey 09/27/2021, 6:28 PM

## 2021-09-27 NOTE — Progress Notes (Signed)
Prenatal counseling for impending newborn done--  Reviewed current vaccine policy and answered all questions.  1st child, Currently 37.6 weeks, Current complications:  GDM on metformin, Prenatal care initiated:  early Z76.81

## 2021-09-27 NOTE — Clinical Social Work Maternal (Signed)
CLINICAL SOCIAL WORK MATERNAL/CHILD NOTE  Patient Details  Name: Isabel Conner MRN: [id number] Date of Birth: 03/17/1996  Date:  05-13-21  Clinical Social Worker Initiating Note:  Darcus Austin, MSW, LCSWA Date/Time: Initiated:  2021/02/17/1311     Child's Name:  [place] Parents:  Mother, Father   Need for Interpreter:  None   Reason for Referral:  Current Substance Use/Substance Use During Pregnancy     Address:  [place] [address]    Phone number:  [phone] (home)     Additional phone number: [phone] ([place], [place])  Household Members/Support Persons (HM/SP):   Household Member/Support Person 1   HM/SP Name Relationship DOB or Age  HM/SP -[place] 05/03/93 FOB  HM/SP -2        HM/SP -3        HM/SP -4        HM/SP -5        HM/SP -6        HM/SP -7        HM/SP -8          Natural Supports (not living in the home):  Other (Comment) (sister, and sister in Sports coach)   Professional Supports: None   Employment: Unemployed   Type of Work:     Education:  [place] arranged:    Museum/gallery curator Resources:  [employer]   Other Resources:  Physicist, medical  , [place] Considerations Which May Impact Care:    Strengths:  Ability to meet basic needs  , Engineer, materials, Home prepared for child     Psychotropic Medications:         Pediatrician:    [place] area  Pediatrician List:   [place]      Pediatrician Fax Number:    Risk Factors/Current Problems:  Substance Use     Cognitive State:  Able to Concentrate  , Alert  , Insightful  , Linear Thinking  , Goal Oriented     Mood/Affect:  Comfortable  , Interested  , Bright  , Calm  , Happy  , Relaxed     CSW Assessment: CSW met with MOB to complete consult for substance use during pregnancy. CSW  observed MOB resting in bed, and FOB at bedside, bonding with infant. MOB gave CSW verbal consent to complete consult while FOB was present. CSW explained role, and reason for consult. MOB was pleasant, and polite during engagement with CSW. MOB reported, history of THC, and her last use was March. MOB reported, history of substance use for two years. MOB reported, her reason for use, was for recreational purposes. MOB denied any additional illicit substances, and CPS involvement. CSW inform MOB of drug screen policy, and MOB was understanding of protocol. CSW will continue to follow the CDS, and will make CPS report if warranted.   CSW provided education regarding the baby blues period vs. perinatal mood disorders, discussed treatment and gave resources for mental health follow up if concerns arise. CSW recommends self- evaluation during the postpartum time period using the New Mom Checklist from Postpartum Progress and encouraged MOB to contact a medical professional if symptoms are noted at any time.   MOB reported, since delivery she feels, ""good. MOB reported, her sister, and sister in law  are very supportive. MOB denied SI, and HI when CSW assessed for safety.   MOB reported, she receives [facility], and food stamps. MOB reported, infant's pediatrician will be at [facility], and there are no transportation barriers to follow up infant's care. MOB reported, she has all essentials needed to care for infant. MOB reported, infant has a car seat, and bassinet. MOB denied any additional barriers.     CSW provided education on sudden infant death syndrome (SIDS).  CSW will continue to follow the CDS, and will make CPS report if warranted.   CSW Plan/Description:  No Further Intervention Required/No Barriers to Discharge, Sudden Infant Death Syndrome (SIDS) Education, Perinatal Mood and Anxiety Disorder (PMADs) Education, [place], CSW Will Continue to Monitor Umbilical Cord  Tissue Drug Screen Results and Make Report if Wilhemena Durie 09/27/2021, 1:19 PM  Darcus Austin, MSW, LCSW-A Clinical Social Worker- Weekends [phone]

## 2021-09-28 LAB — COMPREHENSIVE METABOLIC PANEL
ALT: 16 U/L (ref 0–44)
AST: 49 U/L — ABNORMAL HIGH (ref 15–41)
Albumin: 2 g/dL — ABNORMAL LOW (ref 3.5–5.0)
Alkaline Phosphatase: 86 U/L (ref 38–126)
Anion gap: 9 (ref 5–15)
BUN: 12 mg/dL (ref 6–20)
CO2: 23 mmol/L (ref 22–32)
Calcium: 8.6 mg/dL — ABNORMAL LOW (ref 8.9–10.3)
Chloride: 104 mmol/L (ref 98–111)
Creatinine, Ser: 0.57 mg/dL (ref 0.44–1.00)
GFR, Estimated: >60 mL/min (ref >60)
Glucose, Bld: 114 mg/dL — ABNORMAL HIGH (ref 70–99)
Potassium: 3.5 mmol/L (ref 3.5–5.1)
Sodium: 136 mmol/L (ref 135–145)
Total Bilirubin: 0.6 mg/dL (ref 0.3–1.2)
Total Protein: 5.3 g/dL — ABNORMAL LOW (ref 6.5–8.1)

## 2021-09-28 LAB — CBC WITH DIFFERENTIAL/PLATELET
Abs Immature Granulocytes: 0.11 10*3/uL — ABNORMAL HIGH (ref 0.00–0.07)
Abs Immature Granulocytes: 0.1 10*3/uL — ABNORMAL HIGH (ref 0.00–0.07)
Basophils Absolute: 0 10*3/uL (ref 0.0–0.1)
Basophils Absolute: 0 10*3/uL (ref 0.0–0.1)
Basophils Relative: 0 %
Basophils Relative: 0 %
Eosinophils Absolute: 0 10*3/uL (ref 0.0–0.5)
Eosinophils Absolute: 0.1 10*3/uL (ref 0.0–0.5)
Eosinophils Relative: 0 %
Eosinophils Relative: 0 %
HCT: 32.8 % — ABNORMAL LOW (ref 36.0–46.0)
HCT: 28.3 % — ABNORMAL LOW (ref 36.0–46.0)
Hemoglobin: 10.6 g/dL — ABNORMAL LOW (ref 12.0–15.0)
Hemoglobin: 9.3 g/dL — ABNORMAL LOW (ref 12.0–15.0)
Immature Granulocytes: 1 %
Immature Granulocytes: 1 %
Lymphocytes Relative: 13 %
Lymphocytes Relative: 13 %
Lymphs Abs: 1.7 10*3/uL (ref 0.7–4.0)
Lymphs Abs: 1.6 10*3/uL (ref 0.7–4.0)
MCH: 27.1 pg (ref 26.0–34.0)
MCH: 27.4 pg (ref 26.0–34.0)
MCHC: 32.3 g/dL (ref 30.0–36.0)
MCHC: 32.9 g/dL (ref 30.0–36.0)
MCV: 83.9 fL (ref 80.0–100.0)
MCV: 83.2 fL (ref 80.0–100.0)
Monocytes Absolute: 1.2 10*3/uL — ABNORMAL HIGH (ref 0.1–1.0)
Monocytes Absolute: 1 10*3/uL (ref 0.1–1.0)
Monocytes Relative: 10 %
Monocytes Relative: 8 %
Neutro Abs: 9.4 10*3/uL — ABNORMAL HIGH (ref 1.7–7.7)
Neutro Abs: 9.3 10*3/uL — ABNORMAL HIGH (ref 1.7–7.7)
Neutrophils Relative %: 76 %
Neutrophils Relative %: 78 %
Platelets: 177 10*3/uL (ref 150–400)
Platelets: 190 10*3/uL (ref 150–400)
RBC: 3.91 MIL/uL (ref 3.87–5.11)
RBC: 3.4 MIL/uL — ABNORMAL LOW (ref 3.87–5.11)
RDW: 14.4 % (ref 11.5–15.5)
RDW: 14.3 % (ref 11.5–15.5)
WBC: 12.4 10*3/uL — ABNORMAL HIGH (ref 4.0–10.5)
WBC: 12.1 10*3/uL — ABNORMAL HIGH (ref 4.0–10.5)
nRBC: 0 % (ref 0.0–0.2)
nRBC: 0 % (ref 0.0–0.2)

## 2021-09-28 IMAGING — US US OB COMP LESS 14 WK
1 series · 15 of 22 positions shown · non-contrast
Comparison: None.

CLINICAL DATA: Cramping.

EXAM:
OBSTETRIC <14 WK ULTRASOUND
TECHNIQUE: Transabdominal ultrasound was performed for evaluation of the
gestation as well as the maternal uterus and adnexal regions.

[Series 1: us ob comp less 14 wk · 22 acquisitions, 15 frames shown]
[im 1/22]
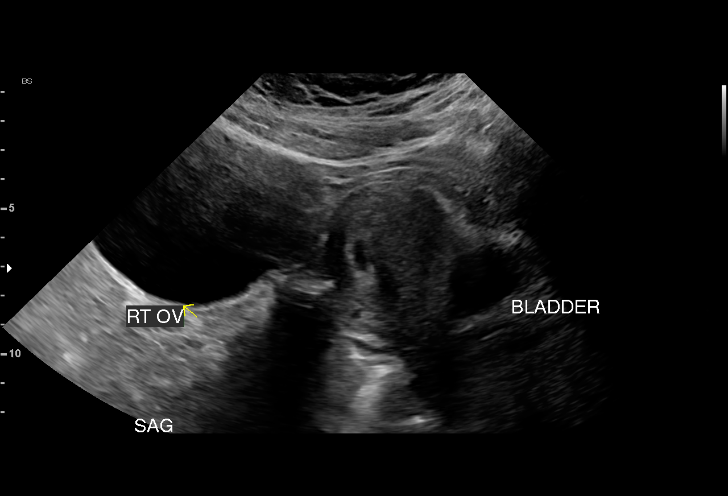
[im 3/22]
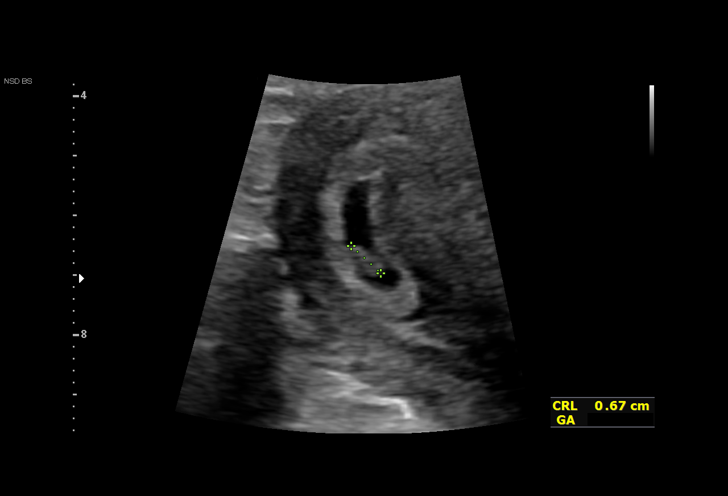
[im 4/22]
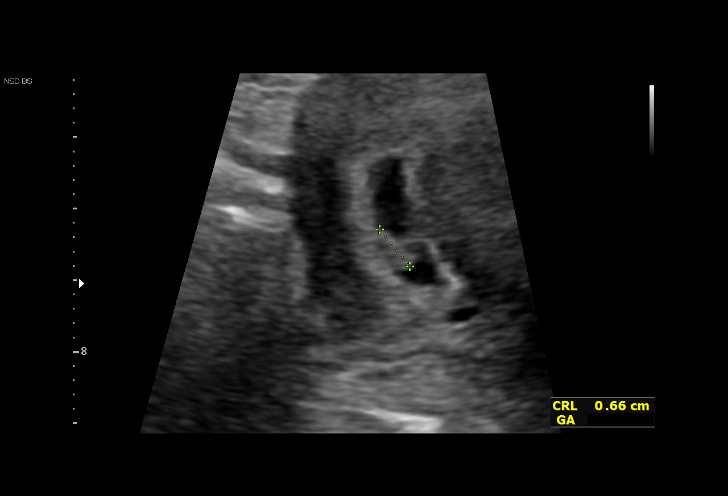
[im 6/22]
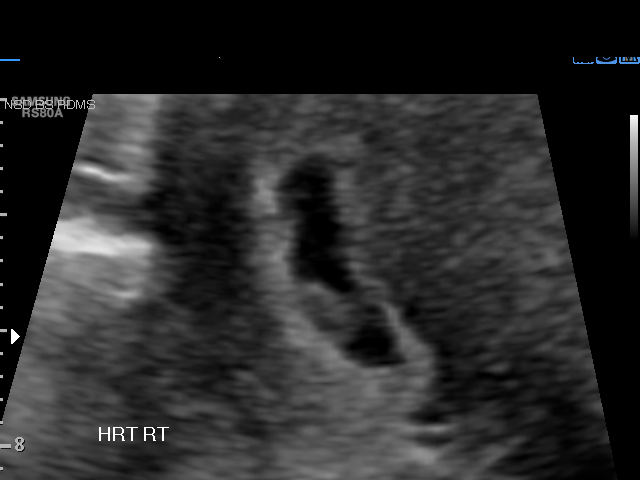
[im 7/22]
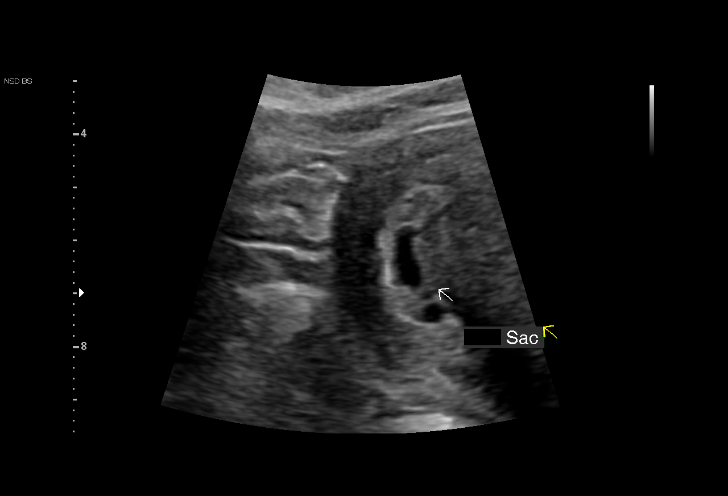
[im 9/22]
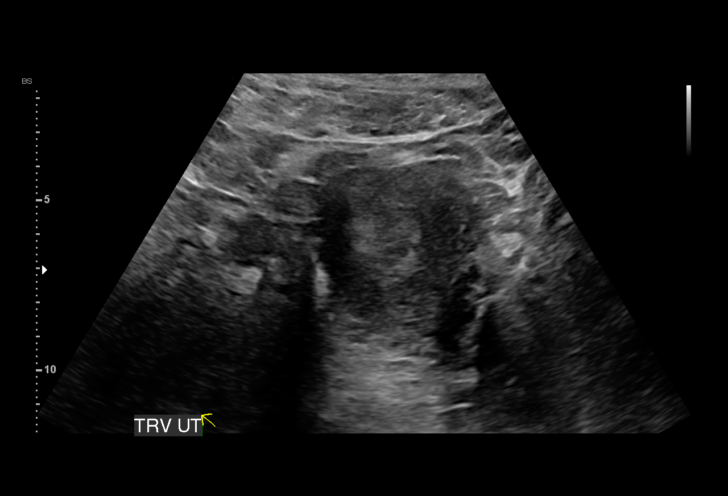
[im 10/22]
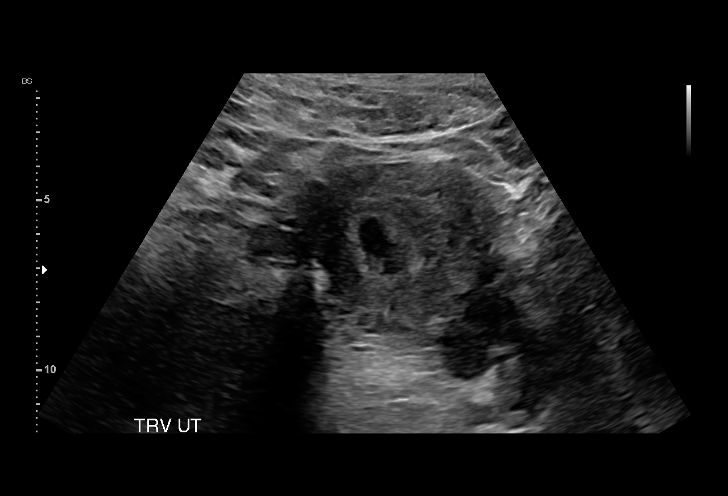
[im 12/22]
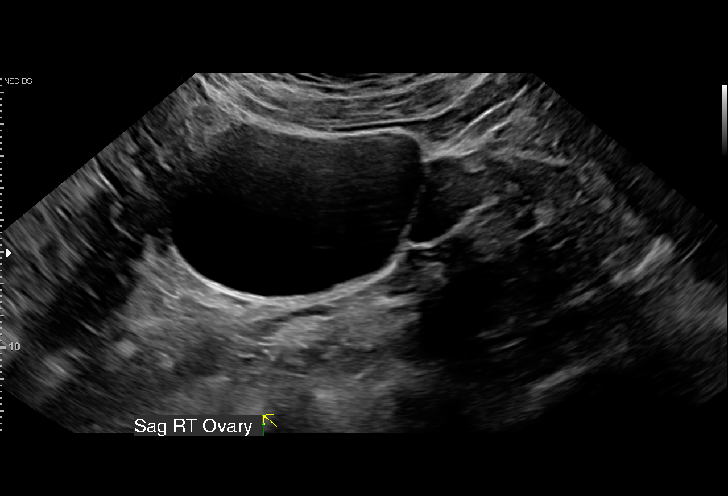
[im 13/22]
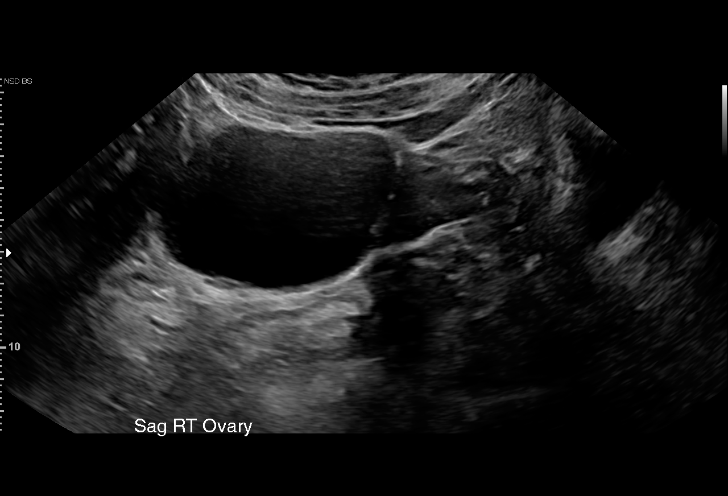
[im 14/22]
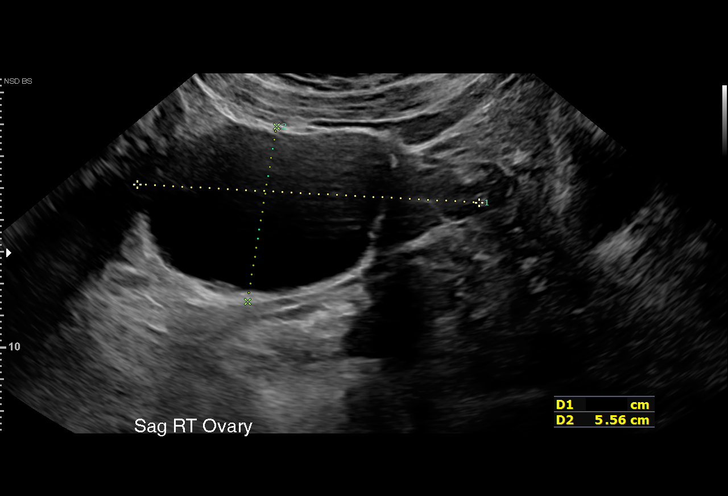
[im 16/22]
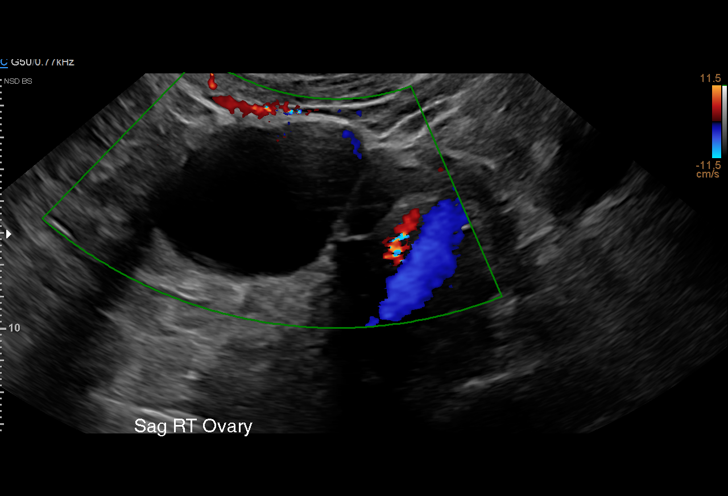
[im 17/22]
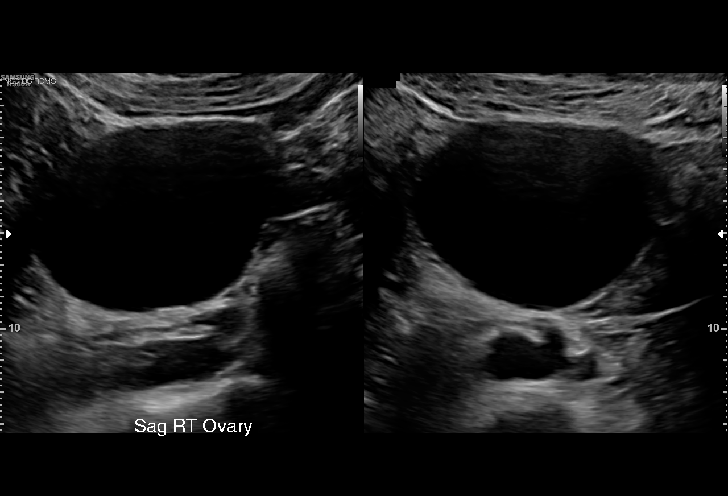
[im 19/22]
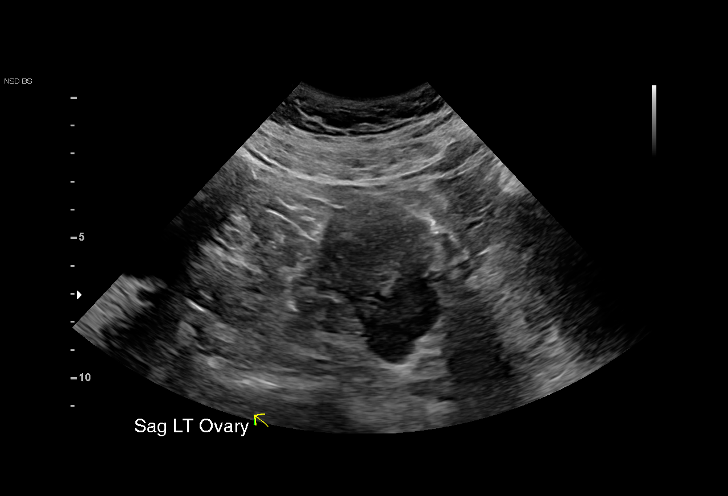
[im 20/22]
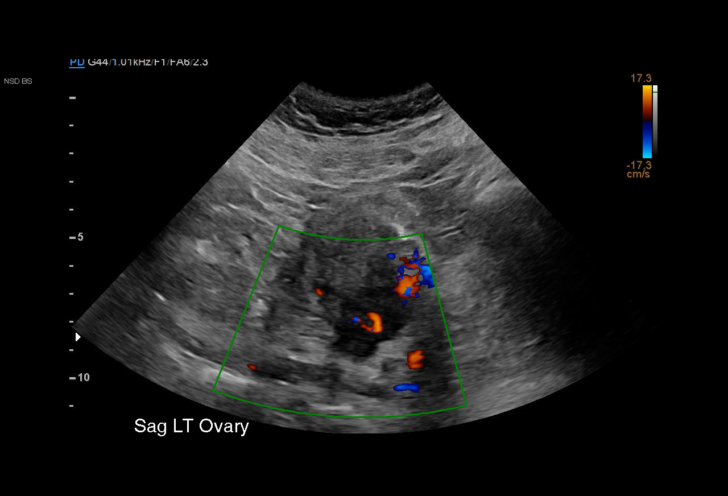
[im 22/22]
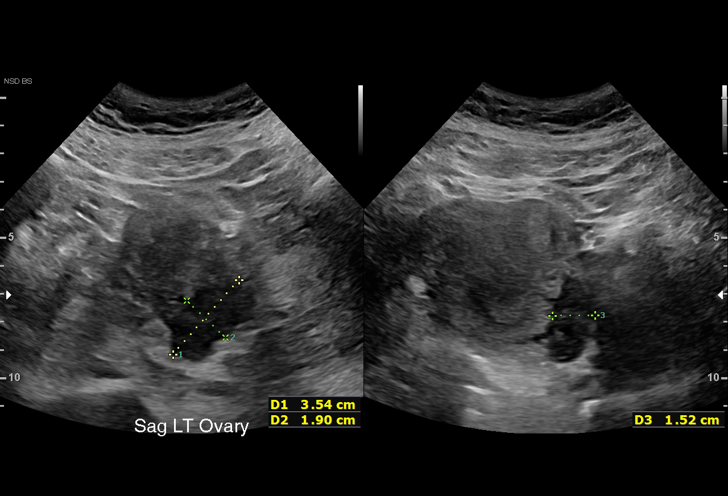

[15 of 22 positions shown; findings below may reference images not displayed]

FINDINGS: Intrauterine gestational sac: Single

Yolk sac:  Visualized.

Embryo:  Visualized.

Cardiac Activity: Visualized.

Heart Rate: 122 bpm

CRL:   6.7 mm   6 w 3 d                  US EDC: September 30, 2021

Subchorionic hemorrhage:  None visualized.

Maternal uterus/adnexae: A 7.7 cm x 6.2 cm x 7.9 cm anechoic
structure is seen within the right ovary. No abnormal flow is noted
within this area on color Doppler evaluation.

The left ovary is visualized and is normal in appearance.
IMPRESSION: 1. Single, viable intrauterine pregnancy at approximately 6 weeks
and 3 days gestation by ultrasound evaluation.
2. Large, simple right ovarian cyst.

## 2021-09-28 MED ORDER — GENTAMICIN SULFATE 40 MG/ML IJ SOLN
5.0000 mg/kg | INTRAVENOUS | Status: AC
  Administered 2021-09-28 – 2021-09-29 (×2): 410 mg via INTRAVENOUS
  Filled 2021-09-28 (×2): qty 10.25

## 2021-09-28 MED ORDER — DOCUSATE SODIUM 100 MG PO CAPS
100.0000 mg | ORAL_CAPSULE | Freq: Every day | ORAL | Status: DC
  Administered 2021-09-28 – 2021-09-30 (×3): 100 mg via ORAL
  Filled 2021-09-28 (×3): qty 1

## 2021-09-28 MED ORDER — CYCLOBENZAPRINE HCL 10 MG PO TABS
5.0000 mg | ORAL_TABLET | Freq: Three times a day (TID) | ORAL | Status: DC | PRN
  Administered 2021-09-28 – 2021-09-30 (×3): 5 mg via ORAL
  Filled 2021-09-28 (×3): qty 1

## 2021-09-28 MED ORDER — CLINDAMYCIN PHOSPHATE 900 MG/50ML IV SOLN
900.0000 mg | Freq: Three times a day (TID) | INTRAVENOUS | Status: AC
  Administered 2021-09-28 – 2021-09-29 (×3): 900 mg via INTRAVENOUS
  Filled 2021-09-28 (×3): qty 50

## 2021-09-28 MED ORDER — IBUPROFEN 600 MG PO TABS
600.0000 mg | ORAL_TABLET | Freq: Four times a day (QID) | ORAL | Status: DC | PRN
  Administered 2021-09-28 – 2021-09-30 (×5): 600 mg via ORAL
  Filled 2021-09-28 (×5): qty 1

## 2021-09-28 MED ORDER — POLYETHYLENE GLYCOL 3350 17 G PO PACK
17.0000 g | PACK | Freq: Every day | ORAL | Status: DC
  Administered 2021-09-29 – 2021-09-30 (×2): 17 g via ORAL
  Filled 2021-09-28 (×2): qty 1

## 2021-09-28 MED ORDER — VANCOMYCIN HCL 2000 MG/400ML IV SOLN
2000.0000 mg | Freq: Two times a day (BID) | INTRAVENOUS | Status: AC
  Administered 2021-09-29 (×3): 2000 mg via INTRAVENOUS
  Filled 2021-09-28 (×3): qty 400

## 2021-09-28 NOTE — Progress Notes (Signed)
  Called by bedside RN who is POD#2 and was previously treated with Clindamycin and Gentamycin for endomteritis (last dose on 10/30).  Patient now feeling chills. Temp up at 103.2.   On exam appears uncomfortable and warm to touch. No diaphoresis. Breath sounds normal. No crackles or evidence of consolidation No CVA tenderness  - CBC with diff - blood cultures  - Restart antibiotics (Gent/Clinda) and add Vancomycin given already treated for 24 hrs with Natasha Bence and Clinda and still had breakthrough fever - Vancomycin chosen given patient's anaphylaxis allergy to PCN so  will avoid ampicillin  Warner Mccreedy, MD, MPH OB Fellow, Faculty Practice

## 2021-09-28 NOTE — Anesthesia Postprocedure Evaluation (Signed)
Anesthesia Post Note  Patient: Isabel Conner  Procedure(s) Performed: CESAREAN SECTION     Patient location during evaluation: Mother Baby Anesthesia Type: Epidural Level of consciousness: awake and alert Pain management: pain level controlled Vital Signs Assessment: post-procedure vital signs reviewed and stable Respiratory status: spontaneous breathing, nonlabored ventilation and respiratory function stable Cardiovascular status: stable Postop Assessment: no headache, no backache and epidural receding Anesthetic complications: no   No notable events documented.  Last Vitals:  Vitals:   09/28/21 1230 09/28/21 1422  BP: 129/69 121/68  Pulse: (!) 111 (!) 105  Resp: 20 18  Temp: 37.3 C 37.1 C  SpO2:  95%    Last Pain:  Vitals:   09/28/21 1422  TempSrc: Oral  PainSc:                  [name]

## 2021-09-28 NOTE — Progress Notes (Addendum)
Post Partum Day 2: Cesarean Delivery Subjective: She endorses incisional pain and more generalized abdominal pain that she suspects might be caused by gas. She has had some episodes of passing gas, but she endorses spans of time where she is afraid to bear down to pass gas when she feels like she needs to. She has not yet had a bowel movement. She is ambulating, urinating, and tolerating oral intake without difficulty. She denies pain, swelling, or erythema in her calves. She does not endorse chest pain or shortness of breath.   Objective: Blood pressure 122/72, pulse 88, temperature 98.6 F (37 C), temperature source Oral, resp. rate 18, height 5\' 3"  (1.6 m), weight 123.8 kg, last menstrual period 12/24/2020, SpO2 100 %, unknown if currently breastfeeding.  Physical Exam:  General: alert, cooperative, and no distress Lochia: appropriate Abdomen: Tender around incision and fundus, otherwise no tenderness elsewhere on abdomen. Soft and non-distended  Uterine Fundus: firm Incision: healing well, no significant drainage, no significant erythema, wound vac in place DVT Evaluation: No evidence of DVT seen on physical exam.  Recent Labs    09/25/21 0838 09/27/21 0354  HGB 11.8* 10.0*  HCT 36.7 30.2*    Assessment/Plan: She is meeting all goals and recovering well after surgery. Generalized abdominal pain likely due to gas which should improve as bowel function returns and patient becomes more comfortable with passing gas. -Anticipate discharge tomorrow -PP GTT at 6 weeks   LOS: 3 days   09/29/21 09/28/2021, 6:39 AM   GME ATTESTATION:  I saw and evaluated the patient. I agree with the findings and the plan of care as documented in the resident's note. My edits were added and additionally:  Discussed getting up and walking around today with adequate hydration to support appropriate bowel function. Temperature has been WNL since solitary elevated temp yesterday morning. Plts  126 on CBC yesterday morning, will repeat today to ensure stable. No gestational thrombocytopenia prior to this and no s/sx concerning for pre-e.   Plan to discharge home tomorrow.    09/30/2021, DO OB Fellow, Faculty [facility], [facility] 09/28/2021 9:21 AM

## 2021-09-28 NOTE — Progress Notes (Signed)
Upon entering patients room, Pt states her sciatica pain has improved, but pt states she has the chills and is covered up with blankets in the bed.  Pt's temp 103.2, no complaints of cough, sore throat, or muscles aches. Lung sounds clear bilaterally. Fundus Firm at U/2 with funda tenderness, pt also states she is voiding frequently but doesn't feel like she fully empty's her bladder, denies pain or burning when urinating.   Dr. Warner Mccreedy notified.

## 2021-09-28 NOTE — Progress Notes (Signed)
pt in room 501 requests cyclobenzaprine for sciatica muscle spasms pain 8/10. She states she took this prior to pregnancy for the same thing and would like to resume now.

## 2021-09-28 NOTE — Progress Notes (Signed)
Pharmacy Antibiotic Note  Isabel Conner is a 26 y.o. female admitted on 09/25/2021 s/p LTCS at [redacted]w[redacted]d. Pt treated with Gentamicin and Clindamycin post-op for > 24 hours with last doses 10/30. Pharmacy has been consulted for Vancomycin and Gentamicin dosing due to temp of 103.2 and anaphylaxis to Penicillin which limits antibiotic selection.  Plan: Vancomycin 2000mg   IV every 12 hours.  Goal trough 15-20 mcg/mL. Gentamicin 410mg  IV q24h. Will continue to follow and assess need for kinetic levels based on duration and clinical status.  Height: 5\' 3"  (160 cm) Weight: 123.8 kg (272 lb 14.4 oz) IBW/kg (Calculated) : 52.4 Adjusted/Dosing weight: 81kg  Temp (24hrs), Avg:99.7 F (37.6 C), Min:98.6 F (37 C), Max:103.2 F (39.6 C)  Recent Labs  Lab 09/25/21 0838 09/27/21 0354 09/28/21 0932 09/28/21 1947  WBC 13.2* 15.1* 12.4* 12.1*  CREATININE  --   --   --  0.57    Estimated Creatinine Clearance: 135.1 mL/min (by C-G formula based on SCr of 0.57 mg/dL).    Allergies  Allergen Reactions   Amoxicillin Anaphylaxis   Penicillins Anaphylaxis    Has patient had a PCN reaction causing immediate rash, facial/tongue/throat swelling, SOB or lightheadedness with hypotension: Yes Has patient had a PCN reaction causing severe rash involving mucus membranes or skin necrosis: No Has patient had a PCN reaction that required hospitalization No Has patient had a PCN reaction occurring within the last 10 years: No If all of the above answers are "NO", then may proceed with Cephalosporin use.    Aleve [Naproxen Sodium] Other (See Comments)    Chest pain. Pt states she can take Iburprofen    Antimicrobials this admission: Gentamicin 5mg /kg q24h 10/29-10/30, 10/31 >> Clindamycin 900mg  IV q8h  10/29-10/30, 10/31 >>  Microbiology results: 10/31 BCx:  10/31 BCx:  Thank you for allowing pharmacy to be a part of this patient's care.  09/28/2021 10:25 PM

## 2021-09-28 NOTE — Lactation Note (Signed)
This note was copied from a baby's chart. Lactation Consultation Note  Patient Name: Isabel Conner TIRWE'R Date: 09/28/2021  Age:27 hours P1, term female infant,-5% weight loss. LC entered the room, dad holding infant swaddled in clothing. Per mom, infant is latching well, most feedings are 20 minutes in length and infant is being supplemented with donor breast milk. LC did not observe latch, per mom, infant breastfeed at 1500 for 20 minutes and at 1600 pm was given 15 mls of donor breast milk. Mom has been using DEBP every 3 hours for 15 minutes on initial setting , per mom, she is now starting to see drops of colostrum on  DEBP breast flanges that she is finger feeding to infant.  Per mom, she doesn't have any  breastfeeding questions or concerns for LC at this time.  Mom's feeding plan: 1- Breastfeed infant according to cues, 8 to 12+ or more times within, 24 hours, skin to skin. 2- Mom will continue to supplement infant after latching infant at the breast with her EBM first and then donor breast milk at ( 48-72) hours mom knows she can offer 18 to 25 mls per feeding. 3- Mom will continue to use DEBP every 3 hours for 15 minutes on initial setting.   Maternal Data    Feeding    LATCH Score                    Lactation Tools Discussed/Used    Interventions    Discharge    Consult Status      Isabel Conner 09/28/2021, 5:35 PM

## 2021-09-29 MED ORDER — BISACODYL 10 MG RE SUPP
10.0000 mg | Freq: Once | RECTAL | Status: AC
  Administered 2021-09-29: 10 mg via RECTAL
  Filled 2021-09-29: qty 1

## 2021-09-29 MED ORDER — CLINDAMYCIN PHOSPHATE 900 MG/50ML IV SOLN
900.0000 mg | Freq: Once | INTRAVENOUS | Status: AC
  Administered 2021-09-29: 900 mg via INTRAVENOUS
  Filled 2021-09-29: qty 50

## 2021-09-29 NOTE — Progress Notes (Signed)
Subjective: Postpartum Day 3: Cesarean Delivery Patient reports tolerating PO, constipated.  Had fever last night. Antibiotics restarted.    Objective: Vital signs in last 24 hours: Temp:  [97.8 F (36.6 C)-103.2 F (39.6 C)] 97.8 F (36.6 C) (11/01 0559) Pulse Rate:  [75-111] 75 (11/01 0559) Resp:  [18-20] 18 (11/01 0559) BP: (95-129)/(63-77) 95/63 (11/01 0559) SpO2:  [95 %-99 %] 99 % (11/01 0559)  Physical Exam:  General: alert, cooperative, and no distress Lochia: appropriate Uterine Fundus: firm Incision: wound vac on DVT Evaluation: No evidence of DVT seen on physical exam. Negative Homan's sign.  Recent Labs    09/28/21 0932 09/28/21 1947  HGB 10.6* 9.3*  HCT 32.8* 28.3*    Assessment/Plan: Status post Cesarean section.  Had fever last night. Still on antibiotics. NO fevers since. WBC normal. Continue current care.  Levie Heritage 09/29/2021, 10:44 AM

## 2021-09-30 ENCOUNTER — Inpatient Hospital Stay (HOSPITAL_COMMUNITY): Admit: 2021-09-30 | Payer: Self-pay

## 2021-09-30 LAB — COMPREHENSIVE METABOLIC PANEL
ALT: 12 U/L (ref 0–44)
AST: 35 U/L (ref 15–41)
Albumin: 1.9 g/dL — ABNORMAL LOW (ref 3.5–5.0)
Alkaline Phosphatase: 70 U/L (ref 38–126)
Anion gap: 8 (ref 5–15)
BUN: 10 mg/dL (ref 6–20)
CO2: 23 mmol/L (ref 22–32)
Calcium: 8.1 mg/dL — ABNORMAL LOW (ref 8.9–10.3)
Chloride: 104 mmol/L (ref 98–111)
Creatinine, Ser: 0.5 mg/dL (ref 0.44–1.00)
GFR, Estimated: >60 mL/min (ref >60)
Glucose, Bld: 94 mg/dL (ref 70–99)
Potassium: 3.3 mmol/L — ABNORMAL LOW (ref 3.5–5.1)
Sodium: 135 mmol/L (ref 135–145)
Total Bilirubin: 0.6 mg/dL (ref 0.3–1.2)
Total Protein: 5.1 g/dL — ABNORMAL LOW (ref 6.5–8.1)

## 2021-09-30 LAB — CBC
HCT: 28.6 % — ABNORMAL LOW (ref 36.0–46.0)
Hemoglobin: 9.2 g/dL — ABNORMAL LOW (ref 12.0–15.0)
MCH: 27 pg (ref 26.0–34.0)
MCHC: 32.2 g/dL (ref 30.0–36.0)
MCV: 83.9 fL (ref 80.0–100.0)
Platelets: 192 10*3/uL (ref 150–400)
RBC: 3.41 MIL/uL — ABNORMAL LOW (ref 3.87–5.11)
RDW: 14.2 % (ref 11.5–15.5)
WBC: 9.3 10*3/uL (ref 4.0–10.5)
nRBC: 0 % (ref 0.0–0.2)

## 2021-09-30 MED ORDER — OXYCODONE-ACETAMINOPHEN 5-325 MG PO TABS: 1.0000 | ORAL_TABLET | Freq: Four times a day (QID) | ORAL | 0 refills | Status: DC | PRN

## 2021-09-30 MED ORDER — DOCUSATE SODIUM 250 MG PO CAPS: 250.0000 mg | ORAL_CAPSULE | Freq: Every day | ORAL | 0 refills | Status: DC

## 2021-09-30 MED ORDER — IBUPROFEN 600 MG PO TABS: 600.0000 mg | ORAL_TABLET | Freq: Four times a day (QID) | ORAL | 0 refills | Status: DC | PRN

## 2021-10-03 LAB — CULTURE, BLOOD (ROUTINE X 2)
Culture: NO GROWTH
Culture: NO GROWTH
Special Requests: ADEQUATE

## 2021-10-05 ENCOUNTER — Encounter: Payer: Self-pay | Admitting: Women's Health

## 2021-10-05 ENCOUNTER — Ambulatory Visit (INDEPENDENT_AMBULATORY_CARE_PROVIDER_SITE_OTHER): Payer: Medicaid Other | Admitting: Women's Health

## 2021-10-05 ENCOUNTER — Other Ambulatory Visit: Payer: Self-pay

## 2021-10-05 VITALS — BP 131/79 | HR 83 | Ht 63.0 in | Wt 254.8 lb

## 2021-10-05 DIAGNOSIS — Z4889 Encounter for other specified surgical aftercare: Secondary | ICD-10-CM

## 2021-10-05 NOTE — Progress Notes (Signed)
   GYN VISIT Patient name: Isabel Conner MRN [id number]  Date of birth: September 13, 1996 Chief Complaint:   Wound Check  History of Present Illness:   Isabel Conner is a 25 y.o. [phone] Hispanic female 9d s/p PCS being seen today for incision check.  Bottlefeeding, no problems.  Patient's last menstrual period was 12/24/2020 (exact date). Depression screen [facility] 2/9 03/19/2021  Decreased Interest 2  Down, Depressed, Hopeless 3  PHQ - 2 Score 5  Altered sleeping 2  Tired, decreased energy 2  Change in appetite 2  Feeling bad or failure about yourself  1  Trouble concentrating 0  Moving slowly or fidgety/restless 0  Suicidal thoughts 1  PHQ-9 Score 13     GAD 7 : Generalized Anxiety Score 03/19/2021  Nervous, Anxious, on Edge 0  Control/stop worrying 1  Worry too much - different things 1  Trouble relaxing 0  Restless 0  Easily annoyed or irritable 1  Afraid - awful might happen 1  Total GAD 7 Score 4     Review of Systems:   Pertinent items are noted in HPI Denies fever/chills, dizziness, headaches, visual disturbances, fatigue, shortness of breath, chest pain, abdominal pain, vomiting, abnormal vaginal discharge/itching/odor/irritation, problems with periods, bowel movements, urination, or intercourse unless otherwise stated above.  Pertinent History Reviewed:  Reviewed past medical,surgical, social, obstetrical and family history.  Reviewed problem list, medications and allergies. Physical Assessment:   Vitals:   10/05/21 1346  BP: 131/79  Pulse: 83  Weight: 254 lb 12.8 oz (115.6 kg)  Height: 5\' 3"  (1.6 m)  Body mass index is 45.14 kg/m.       Physical Examination:   General appearance: alert, well appearing, and in no distress  Mental status: alert, oriented to person, place, and time  Skin: warm & dry   Cardiovascular: normal heart rate noted  Respiratory: normal respiratory effort, no distress  Abdomen: soft, non-tender, Prevena wound vac removed, incision looks  good  Pelvic: examination not indicated  Extremities: no edema   Chaperone: N/A    No results found for this or any previous visit (from the past 24 hour(s)).  Assessment & Plan:  1) 9d s/p PCS > bottlefeeding  2) Incision check> Prevena removed, keep incision clean/dry, reviewed s/s infection  Meds: No orders of the defined types were placed in this encounter.   No orders of the defined types were placed in this encounter.   Return for As scheduled.  CNM, [place] 10/05/2021 2:29 PM

## 2021-10-09 ENCOUNTER — Telehealth (HOSPITAL_COMMUNITY): Payer: Self-pay | Admitting: *Deleted

## 2021-10-09 NOTE — Telephone Encounter (Signed)
Left message to return nurse call.  Duffy Rhody, RN 10-09-2021 at 12:00pm

## 2021-10-13 ENCOUNTER — Telehealth: Payer: Self-pay | Admitting: *Deleted

## 2021-10-13 MED ORDER — IBUPROFEN 600 MG PO TABS: 600.0000 mg | ORAL_TABLET | Freq: Four times a day (QID) | ORAL | 2 refills | Status: DC | PRN

## 2021-10-13 NOTE — Telephone Encounter (Signed)
Refilled ibuprofen

## 2021-10-13 NOTE — Addendum Note (Signed)
Addended by: Cyril Mourning A on: 10/13/2021 11:05 AM   Modules accepted: Orders

## 2021-10-13 NOTE — Telephone Encounter (Signed)
Called patient to discuss mychart message sent.  Encouraged to use small pillow or stuffed animal to guard incision when getting up, coughing, sneezing, etc.  Advised to alternate Tylenol and Ibuprofen for pain control.  Patient states she is out of the Ibuprofen and would like a refill.

## 2021-10-28 ENCOUNTER — Ambulatory Visit: Payer: Medicaid Other | Admitting: Advanced Practice Midwife

## 2021-11-24 ENCOUNTER — Encounter: Payer: Self-pay | Admitting: Women's Health

## 2021-11-24 ENCOUNTER — Telehealth: Payer: Self-pay | Admitting: *Deleted

## 2021-11-24 NOTE — Telephone Encounter (Signed)
Patient states she has started having extreme pain in her right lower abdomen.  She has tried taking Tylenol and Ibuprofen but pain does not seem to be easing off.  Simple ovarian cyst noted on last ultrasound in October.  Advised if pain was worsening, to go to ED but to keep appointment on 12/29. Pt verbalized understanding and agreeable to plan.

## 2021-11-26 ENCOUNTER — Other Ambulatory Visit: Payer: Self-pay

## 2021-11-26 ENCOUNTER — Encounter: Payer: Self-pay | Admitting: Advanced Practice Midwife

## 2021-11-26 ENCOUNTER — Ambulatory Visit (INDEPENDENT_AMBULATORY_CARE_PROVIDER_SITE_OTHER): Payer: Medicaid Other | Admitting: Advanced Practice Midwife

## 2021-11-26 MED ORDER — PHEXXI 1.8-1-0.4 % VA GEL: 1.0000 | VAGINAL | 99 refills | Status: DC | PRN

## 2021-11-26 NOTE — Patient Instructions (Addendum)
[facility]  Supplemental nursing system

## 2021-11-26 NOTE — Progress Notes (Signed)
Post Partum Visit Note   Chief Complaint:   Postpartum Care (Wants Phexxi for birth control; pain in right side)  History of Present Illness:   Isabel Conner is a 25 y.o. [phone]  female being seen today for a postpartum visit. She is 7 weeks postpartum following a primary cesarean section, low transverse incision at  39.3 gestational weeks. For failed IOL, at 5 cms >10 hours  IOL: Yes, for A2 DM. Anesthesia: epidural.  Laceration: n/a.  Complications: Triple I Inpatient contraception: no.   Pregnancy complicated by O7/H DM  . Tobacco use: no. Substance use disorder: no. Last pap smear: 05/18/21 and results were NILM w/ HRHPV negative. Next pap smear due: 2025 Patient's last menstrual period was 11/08/2021 (approximate).  Postpartum course has been uncomplicated. Bleeding  had a period 2 weeks ago, lasted about 5 days.  Had pain rig . Bowel function is normal. Bladder function is normal. Urinary incontinence? No, fecal incontinence? No Patient is sexually active. Marland Kitchen    [facility] - 11/26/21 1536       Pregnancy Intention Screening   Does the patient want to become pregnant in the next year? Unsure    Does the patient's partner want to become pregnant in the next year? Unsure    Would the patient like to discuss contraceptive options today? Yes      Contraception Wrap Up   Current Method No Method - Other Reason            The pregnancy intention screening data noted above was reviewed. Potential methods of contraception were discussed. The patient elected to proceed with No data recorded.  Edinburgh Postpartum Depression Screening: Negative  Edinburgh Postnatal Depression Scale - 11/26/21 1537       Edinburgh Postnatal Depression Scale:  In the Past 7 Days   I have been able to laugh and see the funny side of things. 0    I have looked forward with enjoyment to things. 0    I have blamed myself unnecessarily when things went wrong. 1    I have been anxious or worried for no good  reason. 1    I have felt scared or panicky for no good reason. 0    Things have been getting on top of me. 1    I have been so unhappy that I have had difficulty sleeping. 0    I have felt sad or miserable. 0    I have been so unhappy that I have been crying. 0    The thought of harming myself has occurred to me. 0    Edinburgh Postnatal Depression Scale Total 3            Baby's course has been uncomplicated. Baby is feeding by breast and bottle: milk supply inadequate . Infant has a pediatrician/family doctor? Yes.  Childcare strategy if returning to work/school: yes.  Pt has material needs met for her and baby: No working on fiance getting off work early .   Review of Systems:   Pertinent items are noted in HPI Denies Abnormal vaginal discharge w/ itching/odor/irritation, headaches, visual changes, shortness of breath, chest pain, abdominal pain, severe nausea/vomiting, or problems with urination or bowel movements. Pertinent History Reviewed:  Reviewed past medical,surgical, obstetrical and family history.  Reviewed problem list, medications and allergies. OB History  Gravida Para Term Preterm AB Living  5 1 1   4 1   SAB IAB Ectopic Multiple Live Births  1 2 1  0 1    #  Outcome Date GA Lbr Len/2nd Weight Sex Delivery Anes PTL Lv  5 Term 09/26/21 [redacted]w[redacted]d 8 lb 6 oz (3.8 kg) M CS-Vac EPI  LIV     Birth Comments: Term infant with mild respiratory distress; see Consult note for additional findings  4 IAB 2016          3 IAB           2 SAB           1 Ectopic            Physical Assessment:   Vitals:   11/26/21 1531  BP: 116/71  Pulse: (!) 105  Weight: 252 lb (114.3 kg)  Height: 5' 3"  (1.6 m)  Body mass index is 44.64 kg/m.  Objective:  Blood pressure 116/71, pulse (!) 105, height 5' 3"  (1.6 m), weight 252 lb (114.3 kg), last menstrual period 11/08/2021, currently breastfeeding.  General:  alert, cooperative, and no distress   Breasts:  negative  Lungs: Normal  respiratory effort  Heart:  regular rate and rhythm  Abdomen: soft, non-tender, incision well healed   Vulva:  not evaluated  Vagina: not evaluated  Cervix:  normal  Corpus: Well involuted  Adnexa:  not evaluated  Rectal Exam: no hemorrhoids          No results found for this or any previous visit (from the past 24 hour(s)).  Assessment & Plan:  1) Postpartum exam 2) 8 wks s/p primary cesarean section, low transverse incision 3) breast & bottle feeding 4) Depression screening 5) Contraception management: rx Phexxi  Essential components of care per ACOG recommendations:  1.  Mood and well being:  If positive depression screen, discussed and plan developed.  If using tobacco we discussed reduction/cessation and risk of relapse If current substance abuse, we discussed and referral to local resources was offered.   2. Infant care and feeding:  If breastfeeding, discussed returning to work, pumping, breastfeeding-associated pain, guidance regarding return to fertility while lactating if not using another method. If needed, patient was provided with a letter to be allowed to pump q 2-3hrs to support lactation in a private location with access to a refrigerator to store breastmilk.   Recommended that all caregivers be immunized for flu, pertussis and other preventable communicable diseases If pt does not have material needs met for her/baby, referred to local resources for help obtaining these.  3. Sexuality, contraception and birth spacing Provided guidance regarding sexuality, management of dyspareunia, and resumption of intercourse Discussed avoiding interpregnancy interval <679ms and recommended birth spacing of 18 months  4. Sleep and fatigue Discussed coping options for fatigue and sleep disruption Encouraged family/partner/community support of 4 hrs of uninterrupted sleep to help with mood and fatigue  5. Physical recovery  If pt had a C/S, assessed incisional pain and  providing guidance on normal vs prolonged recovery If pt had a laceration, perineal healing and pain reviewed.  If urinary or fecal incontinence, discussed management and referred to PT or uro/gyn if indicated  Patient is safe to resume physical activity. Discussed attainment of healthy weight.  6.  Chronic disease management Discussed pregnancy complications if any, and their implications for future childbearing and long-term maternal health. Review recommendations for prevention of recurrent pregnancy complications, such as 17 hydroxyprogesterone caproate to reduce risk for recurrent PTB no, or aspirin to reduce risk of preeclampsia not applicable. Pt had GDM: Yes. If yes, 2hr GTT scheduled: yes. Reviewed medications and non-pregnant dosing including consideration of whether  pt is breastfeeding using a reliable resource such as LactMed: not applicable Referred for f/u w/ PCP or subspecialist providers as indicated: not applicable  7. Health maintenance Mammogram at 27yo or earlier if indicated Pap smears as indicated  Meds: No orders of the defined types were placed in this encounter.   Follow-up: No follow-ups on file.   No orders of the defined types were placed in this encounter.      [place] [facility] [employer], [facility] 11/26/2021 4:18 PM

## 2021-11-27 ENCOUNTER — Encounter: Payer: Self-pay | Admitting: Advanced Practice Midwife

## 2021-12-03 ENCOUNTER — Other Ambulatory Visit: Payer: Medicaid Other

## 2021-12-03 DIAGNOSIS — O24419 Gestational diabetes mellitus in pregnancy, unspecified control: Secondary | ICD-10-CM

## 2021-12-04 LAB — GLUCOSE TOLERANCE, 2 HOURS W/ 1HR
Glucose, 1 hour: 255 mg/dL — ABNORMAL HIGH (ref 70–179)
Glucose, 2 hour: 156 mg/dL — ABNORMAL HIGH (ref 70–152)
Glucose, Fasting: 99 mg/dL — ABNORMAL HIGH (ref 70–91)

## 2021-12-07 ENCOUNTER — Encounter: Payer: Self-pay | Admitting: Women's Health

## 2021-12-07 ENCOUNTER — Other Ambulatory Visit: Payer: Self-pay | Admitting: Women's Health

## 2021-12-07 DIAGNOSIS — E119 Type 2 diabetes mellitus without complications: Secondary | ICD-10-CM | POA: Insufficient documentation

## 2021-12-08 ENCOUNTER — Telehealth: Payer: Self-pay

## 2021-12-08 NOTE — Telephone Encounter (Signed)
Called pt to relay test results and Mychart msg from [employer]. Two identifiers used. Pt stated that she still had some Metformin, but was unsure of the dosage available. She also has a PCP, but has no upcoming appt scheduled with them. Spoke with Joellyn Haff who recommended pt restart her Metformin at 500 mg BID and make an appt with her PCP today to follow-up on the new DM diagnosis. Pt confirmed understanding.

## 2021-12-08 NOTE — Telephone Encounter (Signed)
-----   Message from Cheral Marker, [place] sent at 12/08/2021 10:22 AM EST ----- Hasn't read [employer]. Please call and read it to her/have her read it. Thanks  ----- Message ----- From: Nell Range Lab Results In Sent: 12/04/2021   9:36 AM EST To: Cheral Marker, CNM

## 2021-12-23 ENCOUNTER — Ambulatory Visit (HOSPITAL_COMMUNITY)
Admission: EM | Admit: 2021-12-23 | Discharge: 2021-12-23 | Disposition: A | Discharge status: Home / Self Care | Payer: Medicaid Other

## 2021-12-23 ENCOUNTER — Encounter (HOSPITAL_BASED_OUTPATIENT_CLINIC_OR_DEPARTMENT_OTHER): Payer: Self-pay | Admitting: Obstetrics and Gynecology

## 2021-12-23 ENCOUNTER — Other Ambulatory Visit: Payer: Self-pay

## 2021-12-23 ENCOUNTER — Emergency Department (HOSPITAL_BASED_OUTPATIENT_CLINIC_OR_DEPARTMENT_OTHER): Payer: Medicaid Other

## 2021-12-23 ENCOUNTER — Emergency Department (HOSPITAL_BASED_OUTPATIENT_CLINIC_OR_DEPARTMENT_OTHER)
Admission: EM | Admit: 2021-12-23 | Discharge: 2021-12-23 | Disposition: A | Discharge status: Home / Self Care | Payer: Medicaid Other | Attending: Emergency Medicine | Admitting: Emergency Medicine

## 2021-12-23 ENCOUNTER — Encounter (HOSPITAL_COMMUNITY): Payer: Self-pay | Admitting: Emergency Medicine

## 2021-12-23 DIAGNOSIS — N201 Calculus of ureter: Secondary | ICD-10-CM

## 2021-12-23 DIAGNOSIS — D72829 Elevated white blood cell count, unspecified: Secondary | ICD-10-CM | POA: Insufficient documentation

## 2021-12-23 DIAGNOSIS — R1012 Left upper quadrant pain: Secondary | ICD-10-CM

## 2021-12-23 DIAGNOSIS — R112 Nausea with vomiting, unspecified: Secondary | ICD-10-CM

## 2021-12-23 DIAGNOSIS — R7309 Other abnormal glucose: Secondary | ICD-10-CM | POA: Insufficient documentation

## 2021-12-23 DIAGNOSIS — M5137 Other intervertebral disc degeneration, lumbosacral region: Secondary | ICD-10-CM | POA: Insufficient documentation

## 2021-12-23 DIAGNOSIS — R197 Diarrhea, unspecified: Secondary | ICD-10-CM | POA: Diagnosis not present

## 2021-12-23 DIAGNOSIS — N132 Hydronephrosis with renal and ureteral calculous obstruction: Secondary | ICD-10-CM | POA: Diagnosis not present

## 2021-12-23 DIAGNOSIS — M5127 Other intervertebral disc displacement, lumbosacral region: Secondary | ICD-10-CM | POA: Insufficient documentation

## 2021-12-23 DIAGNOSIS — R109 Unspecified abdominal pain: Secondary | ICD-10-CM | POA: Diagnosis present

## 2021-12-23 LAB — URINALYSIS, ROUTINE W REFLEX MICROSCOPIC
Bilirubin Urine: NEGATIVE
Glucose, UA: NEGATIVE mg/dL
Ketones, ur: 40 mg/dL — AB
Leukocytes,Ua: NEGATIVE
Nitrite: NEGATIVE
Protein, ur: 30 mg/dL — AB
RBC / HPF: >50 RBC/hpf — ABNORMAL HIGH (ref 0–5)
Specific Gravity, Urine: 1.025 (ref 1.005–1.030)
pH: 7.5 (ref 5.0–8.0)

## 2021-12-23 LAB — COMPREHENSIVE METABOLIC PANEL
ALT: 12 U/L (ref 0–44)
AST: 16 U/L (ref 15–41)
Albumin: 4.4 g/dL (ref 3.5–5.0)
Alkaline Phosphatase: 63 U/L (ref 38–126)
Anion gap: 14 (ref 5–15)
BUN: 10 mg/dL (ref 6–20)
CO2: 20 mmol/L — ABNORMAL LOW (ref 22–32)
Calcium: 9.7 mg/dL (ref 8.9–10.3)
Chloride: 105 mmol/L (ref 98–111)
Creatinine, Ser: 0.57 mg/dL (ref 0.44–1.00)
GFR, Estimated: >60 mL/min (ref >60)
Glucose, Bld: 155 mg/dL — ABNORMAL HIGH (ref 70–99)
Potassium: 3.7 mmol/L (ref 3.5–5.1)
Sodium: 139 mmol/L (ref 135–145)
Total Bilirubin: 0.3 mg/dL (ref 0.3–1.2)
Total Protein: 7.3 g/dL (ref 6.5–8.1)

## 2021-12-23 LAB — PREGNANCY, URINE: Preg Test, Ur: NEGATIVE

## 2021-12-23 LAB — CBC
HCT: 40.3 % (ref 36.0–46.0)
Hemoglobin: 12.8 g/dL (ref 12.0–15.0)
MCH: 25.3 pg — ABNORMAL LOW (ref 26.0–34.0)
MCHC: 31.8 g/dL (ref 30.0–36.0)
MCV: 79.8 fL — ABNORMAL LOW (ref 80.0–100.0)
Platelets: 244 10*3/uL (ref 150–400)
RBC: 5.05 MIL/uL (ref 3.87–5.11)
RDW: 14.1 % (ref 11.5–15.5)
WBC: 19.8 10*3/uL — ABNORMAL HIGH (ref 4.0–10.5)
nRBC: 0 % (ref 0.0–0.2)

## 2021-12-23 LAB — CBG MONITORING, ED: Glucose-Capillary: 162 mg/dL — ABNORMAL HIGH (ref 70–99)

## 2021-12-23 LAB — LIPASE, BLOOD: Lipase: <10 U/L — ABNORMAL LOW (ref 11–51)

## 2021-12-23 MED ORDER — METOCLOPRAMIDE HCL 5 MG/ML IJ SOLN
10.0000 mg | Freq: Once | INTRAMUSCULAR | Status: AC
  Administered 2021-12-23: 18:00:00 10 mg via INTRAVENOUS
  Filled 2021-12-23: qty 2

## 2021-12-23 MED ORDER — OXYCODONE-ACETAMINOPHEN 5-325 MG PO TABS: 1.0000 | ORAL_TABLET | Freq: Three times a day (TID) | ORAL | 0 refills | Status: AC | PRN

## 2021-12-23 MED ORDER — MORPHINE SULFATE (PF) 4 MG/ML IV SOLN
4.0000 mg | Freq: Once | INTRAVENOUS | Status: AC
  Administered 2021-12-23: 17:00:00 4 mg via INTRAVENOUS
  Filled 2021-12-23: qty 1

## 2021-12-23 MED ORDER — SODIUM CHLORIDE 0.9 % IV BOLUS
1000.0000 mL | Freq: Once | INTRAVENOUS | Status: AC
Start: 2021-12-23 — End: 2021-12-23
  Administered 2021-12-23: 17:00:00 1000 mL via INTRAVENOUS

## 2021-12-23 MED ORDER — ONDANSETRON HCL 4 MG PO TABS: 4.0000 mg | ORAL_TABLET | Freq: Three times a day (TID) | ORAL | 0 refills | Status: AC | PRN

## 2021-12-23 MED ORDER — TAMSULOSIN HCL 0.4 MG PO CAPS: 0.4000 mg | ORAL_CAPSULE | Freq: Every day | ORAL | 0 refills | Status: AC

## 2021-12-23 MED ORDER — IOHEXOL 300 MG/ML  SOLN
100.0000 mL | Freq: Once | INTRAMUSCULAR | Status: AC | PRN
  Administered 2021-12-23: 18:00:00 100 mL via INTRAVENOUS

## 2021-12-23 MED ORDER — HYDROMORPHONE HCL 1 MG/ML IJ SOLN
1.0000 mg | Freq: Once | INTRAMUSCULAR | Status: AC
  Administered 2021-12-23: 19:00:00 1 mg via INTRAVENOUS
  Filled 2021-12-23: qty 1

## 2021-12-23 MED ORDER — ONDANSETRON HCL 4 MG/2ML IJ SOLN
4.0000 mg | Freq: Once | INTRAMUSCULAR | Status: AC | PRN
  Administered 2021-12-23: 16:00:00 4 mg via INTRAVENOUS
  Filled 2021-12-23: qty 2

## 2021-12-23 MED ORDER — KETOROLAC TROMETHAMINE 30 MG/ML IJ SOLN
60.0000 mg | Freq: Once | INTRAMUSCULAR | Status: AC
  Administered 2021-12-23: 19:00:00 60 mg via INTRAVENOUS
  Filled 2021-12-23: qty 2

## 2021-12-23 NOTE — ED Triage Notes (Signed)
PT reports "sinus symptoms" 2 days ago. Woke up with diarrhea today. Had [place] food last night. Had lunch today and then abdominal pain and emesis started.

## 2021-12-23 NOTE — ED Provider Notes (Signed)
[facility] EMERGENCY DEPT Provider Note   CSN: [id number] Arrival date & time: 12/23/21  1542     History  Chief Complaint  Patient presents with   Abdominal Pain    Isabel Conner is a 26 y.o. female presenting from urgent care due to nausea, vomiting and left-sided pain.  Reports that this began yesterday after she ate some [place] food.  It improved last night however today when she ate some [place] food it began to bother her again.  No known sick contacts.  No fevers or chills.  No diarrhea.  Has noted some darker urine over the past 2 days that she describes as brown.  No dysuria.  Believes there may be a decrease in the amount of urine production.  No vaginal symptoms.  Urgent care sent her to the emergency department due to the severity of her symptoms.   Home Medications Prior to Admission medications   Medication Sig Start Date End Date Taking? Authorizing Provider  acetaminophen (TYLENOL) 500 MG tablet Take 2 tablets (1,000 mg total) by mouth every 6 (six) hours as needed. 06/07/21   Starr Lake, CNM  ibuprofen (ADVIL) 600 MG tablet Take 1 tablet (600 mg total) by mouth every 6 (six) hours as needed for mild pain. 10/13/21   Estill Dooms, NP  Lactic Ac-Citric Ac-Pot Bitart (PHEXXI) 1.8-1-0.4 % GEL Place 1 Applicatorful vaginally as needed for up to 1 dose. 11/26/21   Cresenzo-Dishmon, Joaquim Lai, CNM      Allergies    Amoxicillin, Penicillins, and Aleve [naproxen sodium]    Review of Systems   Review of Systems  Constitutional:  Negative for chills and fever.  Gastrointestinal:  Positive for abdominal pain, nausea and vomiting.  Genitourinary:  Positive for difficulty urinating, flank pain and hematuria. Negative for dysuria.   Physical Exam Updated Vital Signs BP (!) 154/90 (BP Location: Right Arm)    Pulse 63    Temp 98.1 F (36.7 C) (Oral)    Resp 16    LMP 11/25/2021 Comment: negative pregnancy test 12/23/21   SpO2 100%     Breastfeeding No  Physical Exam Vitals and nursing note reviewed.  Constitutional:      Appearance: Normal appearance. She is ill-appearing.  HENT:     Head: Normocephalic and atraumatic.  Eyes:     General: No scleral icterus.    Conjunctiva/sclera: Conjunctivae normal.  Pulmonary:     Effort: Pulmonary effort is normal. No respiratory distress.  Abdominal:     General: Abdomen is flat.     Palpations: Abdomen is soft.     Tenderness: There is left CVA tenderness.     Hernia: No hernia is present.  Skin:    General: Skin is warm and dry.     Findings: No rash.  Neurological:     Mental Status: She is alert.  Psychiatric:        Mood and Affect: Mood normal.    ED Results / Procedures / Treatments   Labs (all labs ordered are listed, but only abnormal results are displayed) Labs Reviewed  LIPASE, BLOOD - Abnormal; Notable for the following components:      Result Value   Lipase <10 (*)    All other components within normal limits  COMPREHENSIVE METABOLIC PANEL - Abnormal; Notable for the following components:   CO2 20 (*)    Glucose, Bld 155 (*)    All other components within normal limits  CBC - Abnormal; Notable for the  following components:   WBC 19.8 (*)    MCV 79.8 (*)    MCH 25.3 (*)    All other components within normal limits  URINALYSIS, ROUTINE W REFLEX MICROSCOPIC - Abnormal; Notable for the following components:   APPearance HAZY (*)    Hgb urine dipstick LARGE (*)    Ketones, ur 40 (*)    Protein, ur 30 (*)    RBC / HPF >50 (*)    All other components within normal limits  CBG MONITORING, ED - Abnormal; Notable for the following components:   Glucose-Capillary 162 (*)    All other components within normal limits  PREGNANCY, URINE    EKG None  Radiology CT ABDOMEN PELVIS W CONTRAST  Result Date: 12/23/2021 CLINICAL DATA:  Vomiting since eating [place] food last night. EXAM: CT ABDOMEN AND PELVIS WITH CONTRAST TECHNIQUE: Multidetector CT  imaging of the abdomen and pelvis was performed using the standard protocol following bolus administration of intravenous contrast. RADIATION DOSE REDUCTION: This exam was performed according to the departmental dose-optimization program which includes automated exposure control, adjustment of the mA and/or kV according to patient size and/or use of iterative reconstruction technique. CONTRAST:  176mL OMNIPAQUE IOHEXOL 300 MG/ML  SOLN COMPARISON:  None. FINDINGS: Lower chest: Unremarkable. Hepatobiliary: No focal liver abnormality is seen. No gallstones, gallbladder wall thickening, or biliary dilatation. Pancreas: Unremarkable. No pancreatic ductal dilatation or surrounding inflammatory changes. Spleen: Normal in size without focal abnormality. Adrenals/Urinary Tract: Normal appearing adrenal glands. 4 mm calculus in the proximal ureter on the left at the UPJ. Mild-to-moderate dilatation of the left renal collecting system. Mild diffuse low density in enlargement of the left kidney compared to the right kidney. Small, exophytic right renal cyst. Additional smaller cortical cyst. Unremarkable right ureter, mid and distal left ureter and urinary bladder. Multiple small bilateral pelvic phleboliths. Stomach/Bowel: Stomach is within normal limits. Appendix appears normal. No evidence of bowel wall thickening, distention, or inflammatory changes. Vascular/Lymphatic: No significant vascular findings are present. No enlarged abdominal or pelvic lymph nodes. Reproductive: Uterus and bilateral adnexa are unremarkable. Other: No abdominal wall hernia or abnormality. No abdominopelvic ascites. Musculoskeletal: Disc space narrowing and posterior spur formation at the L4-5 and L5-S1 levels. Minimal lower thoracic spine degenerative changes. IMPRESSION: 1. 4 mm left UPJ calculus causing mild to moderate left hydronephrosis and obstructive changes of the left kidney. 2. L4-5 and L5-S1 degenerative changes with moderate posterior  disc protrusion and spur formation. 3. Otherwise, unremarkable examination. Electronically Signed   By: Claudie Revering M.D.   On: 12/23/2021 18:29    Procedures Procedures    Medications Ordered in ED Medications  ondansetron (ZOFRAN) injection 4 mg (4 mg Intravenous Given 12/23/21 1613)  sodium chloride 0.9 % bolus 1,000 mL (1,000 mLs Intravenous New Bag/Given 12/23/21 1641)  morphine 4 MG/ML injection 4 mg (4 mg Intravenous Given 12/23/21 1643)  iohexol (OMNIPAQUE) 300 MG/ML solution 100 mL (100 mLs Intravenous Contrast Given 12/23/21 1808)  metoCLOPramide (REGLAN) injection 10 mg (10 mg Intravenous Given 12/23/21 1821)  ketorolac (TORADOL) 30 MG/ML injection 60 mg (60 mg Intravenous Given 12/23/21 1838)    ED Course/ Medical Decision Making/ A&P                           Medical Decision Making Amount and/or Complexity of Data Reviewed Labs: ordered. Radiology: ordered.  Risk Prescription drug management.   Patient presents to the ED for concern of flank pain. The  differential diagnosis of emergent flank pain includes, but is not limited to nephrolithiasis, pyelonephritis, perforated PUD, diverticulitis,, cystitis, pancreatitis, cholecystitis.  All were considered throughout the evaluation this patient.  Co morbidities that complicate the patient evaluation  Marijuana use  Additional history obtained:  Additional history obtained from chart review.  Urgent care note reviewed as well as surgical note from her C-section.  Lab Tests:  I Ordered, and personally interpreted labs.  The pertinent results include:  WBC 19.8 & Hematuria without infection.   Imaging Studies ordered:  I ordered imaging studies including CT renal. I independently visualized and interpreted imaging which showed a 4 mm UPJ stone with mild to moderate hydronephrosis. I agree with the radiologist interpretation    Medicines ordered and prescription drug management:  I ordered medication including  Zofran for nausea.  Reevaluation the slightly helped her.  When she returned from CT scan she began vomiting again and was given Reglan and Toradol.  She reports that this helped her however she again became in pain.  Dilaudid ordered for pain.  Majority of symptoms resolved at this time  Test Considered:  N/A    Consultations Obtained:  I requested consultation with Dr. Diona Fanti with urology who recommended putting the patient on a course of Flomax to assist and passing her kidney stone and follow-up with him in the office.   Patient is agreeable to this   Problem List / ED Course:  Nephrolithiasis   Dispostion:  Patient is no longer vomiting and is passing p.o. challenge.  After consideration of the diagnostic results and the patients response to treatment, I feel that the patent would benefit from Zofran, Percocet and tamsulosin.  These should treat her symptoms while she attempts to pass the kidney stone on her own.  She will follow-up with Dr. Diona Fanti.   Final Clinical Impression(s) / ED Diagnoses Final diagnoses:  Ureteropelvic junction calculus    Rx / DC Orders Results and diagnoses were explained to the patient. Return precautions discussed in full. Patient had no additional questions and expressed complete understanding.   This chart was dictated using voice recognition software.  Despite best efforts to proofread,  errors can occur which can change the documentation meaning.    Darliss Ridgel 12/23/21 1947    Davonna Belling, MD 12/24/21 1239

## 2021-12-23 NOTE — Discharge Instructions (Addendum)
Patient is to be transported to the emergency room for further testing significant other will drive her

## 2021-12-23 NOTE — Discharge Instructions (Addendum)
Please follow-up with the urology office attached to these discharge papers.  You may call them tomorrow for an appointment either Friday or Monday.  It is likely you will pass this kidney stone on your own.  I have sent pain medication, nausea medication as well as medication to help you pass the kidney stone to your pharmacy.  Remember you may take ibuprofen in addition to these medications however you should not take Tylenol because that is already built in.

## 2021-12-23 NOTE — ED Provider Notes (Signed)
Isabel Conner    CSN: [id number] Arrival date & time: 12/23/21  1431      History   Chief Complaint Chief Complaint  Patient presents with   Abdominal Pain   Emesis    HPI Isabel Conner is a 26 y.o. female.   Patient is here for extreme left-sided abdominal pain.  Patient states she thought it was from [place] that she ate last night.  Patient is restless on the chair screaming and yelling in pain provider is not able to palpate abdomen without patient screaming in pain.  Not able to do a full exam.  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 Authorizing Provider  acetaminophen (TYLENOL) 500 MG tablet Take 2 tablets (1,000 mg total) by mouth every 6 (six) hours as needed. 06/07/21   Starr Lake, CNM  ibuprofen (ADVIL) 600 MG tablet Take 1 tablet (600 mg total) by mouth every 6 (six) hours as needed for mild pain. 10/13/21   Estill Dooms, NP  Lactic Ac-Citric Ac-Pot Bitart (PHEXXI) 1.8-1-0.4 % GEL Place 1  Applicatorful vaginally as needed for up to 1 dose. 11/26/21   Cresenzo-Dishmon, Joaquim Lai, CNM    Family History Family History  Problem Relation Age of Onset   Diabetes Paternal Grandmother    Diabetes Maternal Grandmother    Hypertension Father    Diabetes Mother    Hypertension Mother     Social History Social History   Tobacco Use   Smoking status: Never   Smokeless tobacco: Never  Vaping Use   Vaping Use: Never used  Substance Use Topics   Alcohol use: Yes    Alcohol/week: 3.0 standard drinks    Types: 3 Standard drinks or equivalent per week    Comment: occ   Drug use: Not Currently    Types: Marijuana    Comment: quit in December 2021     Allergies   Amoxicillin, Penicillins, and Aleve [naproxen sodium]   Review of Systems Review of Systems  Constitutional:        Patient is anxious restless not able to sit still leaning over the bed holding her lower abdomen tearful  Respiratory: Negative.    Cardiovascular: Negative.   Gastrointestinal:  Positive for abdominal pain, nausea and vomiting.  Genitourinary: Negative.   Neurological: Negative.     Physical Exam Triage Vital Signs ED Triage Vitals  Enc Vitals Group     BP 12/23/21 1508 (!) 141/95     Pulse Rate 12/23/21 1508 76     Resp 12/23/21 1508 16     Temp 12/23/21 1508 97.7 F (36.5 C)     Temp Source 12/23/21 1508 Oral     SpO2 12/23/21 1508 97 %     Weight --      Height --      Head Circumference --      Peak Flow --      Pain Score 12/23/21 1506 9     Pain Loc --      Pain Edu? --      Excl. in [place]? --    No data found.  Updated Vital Signs BP (!) 141/95 (BP Location: Left Wrist)    Pulse 76    Temp 97.7 F (36.5 C) (Oral)    Resp 16    LMP 11/25/2021    SpO2 97%    Breastfeeding No   Visual Acuity Right Eye Distance:   Left Eye Distance:   Bilateral Distance:    Right Eye Near:   Left Eye Near:    Bilateral Near:     Physical Exam Constitutional:      General: She is in  acute distress.     Comments: Patient is restless not able to do a full assessment on patient when provider palpated abdomen patient began to scream not able to tolerate sitting still.  Cardiovascular:     Rate and Rhythm: Normal rate.  Pulmonary:     Effort: Pulmonary effort is normal.  Abdominal:     Tenderness: There is abdominal tenderness in the left lower quadrant. There is guarding and rebound.     UC Treatments / Results  Labs (all labs ordered are listed, but only abnormal results are displayed) Labs Reviewed - No data to display  EKG   Radiology No results found.  Procedures Procedures (including critical care time)  Medications Ordered in UC Medications - No data to display  Initial Impression / Assessment and Plan / UC Course  I have reviewed the triage vital signs and the nursing notes.  Pertinent labs & imaging results that were available during my care of the patient were reviewed by me and considered in my medical decision making (see chart for details).     Patient significant other is here patient needs to go to the emergency room for further testing and exams.  Patient is crying and screaming of pain not able to sit still during exam.  Final Clinical Impressions(s) / UC Diagnoses   Final diagnoses:  None   Discharge Instructions   None    ED Prescriptions   None    PDMP not reviewed this encounter.   Marney Setting, NP 12/23/21 1529

## 2021-12-23 NOTE — ED Triage Notes (Signed)
Had negative strep, flu, and covid this AM

## 2021-12-23 NOTE — ED Triage Notes (Signed)
Patient reports to the ER for abdominal pain and emesis. Patient reports she has been vomiting for since last night after having chinese.

## 2022-07-10 ENCOUNTER — Encounter (HOSPITAL_BASED_OUTPATIENT_CLINIC_OR_DEPARTMENT_OTHER): Payer: Self-pay

## 2022-07-10 ENCOUNTER — Emergency Department (HOSPITAL_BASED_OUTPATIENT_CLINIC_OR_DEPARTMENT_OTHER): Payer: Medicaid Other

## 2022-07-10 ENCOUNTER — Other Ambulatory Visit: Payer: Self-pay

## 2022-07-10 ENCOUNTER — Emergency Department (HOSPITAL_BASED_OUTPATIENT_CLINIC_OR_DEPARTMENT_OTHER)
Admission: EM | Admit: 2022-07-10 | Discharge: 2022-07-10 | Disposition: A | Discharge status: Home / Self Care | Payer: Medicaid Other | Attending: Emergency Medicine | Admitting: Emergency Medicine

## 2022-07-10 DIAGNOSIS — E119 Type 2 diabetes mellitus without complications: Secondary | ICD-10-CM | POA: Insufficient documentation

## 2022-07-10 DIAGNOSIS — R197 Diarrhea, unspecified: Secondary | ICD-10-CM | POA: Diagnosis not present

## 2022-07-10 DIAGNOSIS — R112 Nausea with vomiting, unspecified: Secondary | ICD-10-CM

## 2022-07-10 DIAGNOSIS — N83201 Unspecified ovarian cyst, right side: Secondary | ICD-10-CM

## 2022-07-10 DIAGNOSIS — N83291 Other ovarian cyst, right side: Secondary | ICD-10-CM | POA: Diagnosis not present

## 2022-07-10 DIAGNOSIS — R1010 Upper abdominal pain, unspecified: Secondary | ICD-10-CM | POA: Diagnosis present

## 2022-07-10 LAB — COMPREHENSIVE METABOLIC PANEL
ALT: 11 U/L (ref 0–44)
AST: 16 U/L (ref 15–41)
Albumin: 4 g/dL (ref 3.5–5.0)
Alkaline Phosphatase: 59 U/L (ref 38–126)
Anion gap: 13 (ref 5–15)
BUN: 9 mg/dL (ref 6–20)
CO2: 24 mmol/L (ref 22–32)
Calcium: 9.4 mg/dL (ref 8.9–10.3)
Chloride: 105 mmol/L (ref 98–111)
Creatinine, Ser: 0.46 mg/dL (ref 0.44–1.00)
GFR, Estimated: >60 mL/min (ref >60)
Glucose, Bld: 151 mg/dL — ABNORMAL HIGH (ref 70–99)
Potassium: 3.8 mmol/L (ref 3.5–5.1)
Sodium: 142 mmol/L (ref 135–145)
Total Bilirubin: 0.3 mg/dL (ref 0.3–1.2)
Total Protein: 7.2 g/dL (ref 6.5–8.1)

## 2022-07-10 LAB — URINALYSIS, ROUTINE W REFLEX MICROSCOPIC
Bilirubin Urine: NEGATIVE
Glucose, UA: NEGATIVE mg/dL
Hgb urine dipstick: NEGATIVE
Ketones, ur: NEGATIVE mg/dL
Leukocytes,Ua: NEGATIVE
Nitrite: NEGATIVE
Protein, ur: NEGATIVE mg/dL
Specific Gravity, Urine: 1.015 (ref 1.005–1.030)
pH: 6.5 (ref 5.0–8.0)

## 2022-07-10 LAB — CBC
HCT: 44.4 % (ref 36.0–46.0)
Hemoglobin: 14.3 g/dL (ref 12.0–15.0)
MCH: 25.5 pg — ABNORMAL LOW (ref 26.0–34.0)
MCHC: 32.2 g/dL (ref 30.0–36.0)
MCV: 79.1 fL — ABNORMAL LOW (ref 80.0–100.0)
Platelets: 246 10*3/uL (ref 150–400)
RBC: 5.61 MIL/uL — ABNORMAL HIGH (ref 3.87–5.11)
RDW: 15.7 % — ABNORMAL HIGH (ref 11.5–15.5)
WBC: 14.6 10*3/uL — ABNORMAL HIGH (ref 4.0–10.5)
nRBC: 0 % (ref 0.0–0.2)

## 2022-07-10 LAB — LIPASE, BLOOD: Lipase: 11 U/L (ref 11–51)

## 2022-07-10 LAB — PREGNANCY, URINE: Preg Test, Ur: NEGATIVE

## 2022-07-10 MED ORDER — KETOROLAC TROMETHAMINE 30 MG/ML IJ SOLN
30.0000 mg | Freq: Once | INTRAMUSCULAR | Status: AC
  Administered 2022-07-10: 30 mg via INTRAVENOUS
  Filled 2022-07-10: qty 1

## 2022-07-10 MED ORDER — PROMETHAZINE HCL 25 MG/ML IJ SOLN
INTRAMUSCULAR | Status: AC
  Filled 2022-07-10: qty 1

## 2022-07-10 MED ORDER — SODIUM CHLORIDE 0.9 % IV SOLN
25.0000 mg | Freq: Once | INTRAVENOUS | Status: AC
  Administered 2022-07-10: 25 mg via INTRAVENOUS
  Filled 2022-07-10: qty 1

## 2022-07-10 MED ORDER — IOHEXOL 300 MG/ML  SOLN
100.0000 mL | Freq: Once | INTRAMUSCULAR | Status: AC | PRN
  Administered 2022-07-10: 100 mL via INTRAVENOUS

## 2022-07-10 MED ORDER — ONDANSETRON HCL 4 MG/2ML IJ SOLN
4.0000 mg | Freq: Once | INTRAMUSCULAR | Status: AC
  Administered 2022-07-10: 4 mg via INTRAVENOUS
  Filled 2022-07-10: qty 2

## 2022-07-10 MED ORDER — ONDANSETRON 8 MG PO TBDP: 8.0000 mg | ORAL_TABLET | Freq: Three times a day (TID) | ORAL | 0 refills | Status: DC | PRN

## 2022-07-10 MED ORDER — ONDANSETRON HCL 4 MG/2ML IJ SOLN: 4.0000 mg | Freq: Once | INTRAMUSCULAR | Status: DC | PRN

## 2022-07-10 MED ORDER — SODIUM CHLORIDE 0.9 % IV BOLUS
1000.0000 mL | Freq: Once | INTRAVENOUS | Status: AC
  Administered 2022-07-10: 1000 mL via INTRAVENOUS

## 2022-07-10 MED ORDER — FENTANYL CITRATE PF 50 MCG/ML IJ SOSY
50.0000 ug | PREFILLED_SYRINGE | Freq: Once | INTRAMUSCULAR | Status: AC
  Administered 2022-07-10: 50 ug via INTRAVENOUS
  Filled 2022-07-10: qty 1

## 2022-07-10 NOTE — ED Provider Notes (Signed)
[facility] EMERGENCY DEPT Provider Note   CSN: [id number] Arrival date & time: 07/10/22  6834     History  Chief Complaint  Patient presents with   Emesis    Isabel Conner is a 26 y.o. female.  She has a history of diabetes.  Complaining of 1 week of some upper abdominal crampy pain.  Went out to eat last night and since midnight has been having diarrhea and starting earlier this morning has had progressive vomiting.  No blood from above or below no fevers.  Continues to have crampy upper abdominal pain.  No sick contacts.  Has tried nothing for it.  Feeling dizzy lightheaded.  The history is provided by the patient.  Emesis Severity:  Moderate Duration:  6 hours Timing:  Constant Progression:  Unchanged Chronicity:  New Recent urination:  Normal Worsened by:  Nothing Ineffective treatments:  None tried Associated symptoms: abdominal pain and diarrhea   Associated symptoms: no cough and no fever   Risk factors: alcohol use   Risk factors: no sick contacts, no suspect food intake and no travel to endemic areas        Home Medications Prior to Admission medications   Medication Sig Start Date End Date Taking? Authorizing Provider  acetaminophen (TYLENOL) 500 MG tablet Take 2 tablets (1,000 mg total) by mouth every 6 (six) hours as needed. 06/07/21   Marylene Land, CNM  ibuprofen (ADVIL) 600 MG tablet Take 1 tablet (600 mg total) by mouth every 6 (six) hours as needed for mild pain. 10/13/21   Adline Potter, NP  Lactic Ac-Citric Ac-Pot Bitart (PHEXXI) 1.8-1-0.4 % GEL Place 1 Applicatorful vaginally as needed for up to 1 dose. 11/26/21   Cresenzo-Dishmon, Scarlette Calico, CNM      Allergies    Amoxicillin, Penicillins, and Aleve [naproxen sodium]    Review of Systems   Review of Systems  Constitutional:  Negative for fever.  Respiratory:  Negative for cough.   Cardiovascular:  Negative for chest pain.  Gastrointestinal:  Positive for abdominal  pain, diarrhea and vomiting.  Genitourinary:  Negative for dysuria.    Physical Exam Updated Vital Signs BP (!) 144/85 (BP Location: Right Arm)   Pulse 75   Temp 98 F (36.7 C) (Oral)   Resp 19   Ht 5\' 3"  (1.6 m)   Wt 102.5 kg   LMP 07/01/2022 (Approximate)   SpO2 100%   Breastfeeding No   BMI 40.03 kg/m  Physical Exam Vitals and nursing note reviewed.  Constitutional:      General: She is not in acute distress.    Appearance: Normal appearance. She is well-developed. She is obese.  HENT:     Head: Normocephalic and atraumatic.  Eyes:     Conjunctiva/sclera: Conjunctivae normal.  Cardiovascular:     Rate and Rhythm: Normal rate and regular rhythm.     Heart sounds: No murmur heard. Pulmonary:     Effort: Pulmonary effort is normal. No respiratory distress.     Breath sounds: Normal breath sounds.  Abdominal:     Palpations: Abdomen is soft.     Tenderness: There is no abdominal tenderness. There is no guarding or rebound.  Musculoskeletal:        General: Normal range of motion.     Cervical back: Neck supple.  Skin:    General: Skin is warm and dry.     Capillary Refill: Capillary refill takes less than 2 seconds.  Neurological:     General:  No focal deficit present.     Mental Status: She is alert.     ED Results / Procedures / Treatments   Labs (all labs ordered are listed, but only abnormal results are displayed) Labs Reviewed  COMPREHENSIVE METABOLIC PANEL - Abnormal; Notable for the following components:      Result Value   Glucose, Bld 151 (*)    All other components within normal limits  CBC - Abnormal; Notable for the following components:   WBC 14.6 (*)    RBC 5.61 (*)    MCV 79.1 (*)    MCH 25.5 (*)    RDW 15.7 (*)    All other components within normal limits  LIPASE, BLOOD  URINALYSIS, ROUTINE W REFLEX MICROSCOPIC  PREGNANCY, URINE    EKG None  Radiology CT Abdomen Pelvis W Contrast  Result Date: 07/10/2022 CLINICAL DATA:   Abdominal pain, vomiting EXAM: CT ABDOMEN AND PELVIS WITH CONTRAST TECHNIQUE: Multidetector CT imaging of the abdomen and pelvis was performed using the standard protocol following bolus administration of intravenous contrast. RADIATION DOSE REDUCTION: This exam was performed according to the departmental dose-optimization program which includes automated exposure control, adjustment of the mA and/or kV according to patient size and/or use of iterative reconstruction technique. CONTRAST:  OMNIPAQUE IOHEXOL 300 MG/ML  SOLN COMPARISON:  12/23/2021 FINDINGS: Lower chest: Unremarkable. Hepatobiliary: There is fatty infiltration in liver. No focal abnormalities are seen. There is no dilation of bile ducts. Gallbladder is unremarkable. Pancreas: No focal abnormalities are seen. Spleen: Spleen measures 14.3 cm in maximum diameter. Adrenals/Urinary Tract: Adrenals are unremarkable. There is no hydronephrosis. There are no renal or ureteral stones. There is interval passage of calculus at the left ureteropelvic junction since the previous study. Urinary bladder is not distended. Stomach/Bowel: Stomach is unremarkable. Small bowel loops are not dilated. Appendix is not dilated. There is no significant focal wall thickening in colon. There is no pericolic stranding. Vascular/Lymphatic: Unremarkable. Reproductive: Uterus is unremarkable. There is 6.8 by 6 cm fluid density lesion in the right adnexa. There is 3.4 cm smoothly marginated lesion immediately lateral to the right adnexal cyst with density measurements higher than usual for simple cyst, possibly hemorrhagic cyst. No left adnexal lesions are seen. Other: There is no ascites or pneumoperitoneum. Umbilical hernia containing fat is seen. Musculoskeletal: Degenerative changes are noted in lumbar spine, particularly severe at L4-L5 level with spinal stenosis and encroachment of neural foramina. Degenerative changes to a lesser extent are noted at L5-S1 level with  encroachment of neural foramina. Sclerosis noted adjacent to the SI joints and may suggest chronic sacroiliitis. IMPRESSION: There is no evidence of intestinal obstruction or pneumoperitoneum. Appendix is not dilated. There is no hydronephrosis. There is 6.8 cm smoothly marginated fluid density lesion in the right adnexa suggesting simple functional cyst in the right ovary. There is 3.4 cm smoothly marginated lesion with higher than usual density measurements for simple cyst, possibly hemorrhagic cyst in the right adnexa. If there is clinical suspicion for right ovarian torsion, follow-up pelvic sonogram may be considered. If there is no such concern, short-term follow-up pelvic sonogram in 2-3 months may be considered. Fatty liver. Enlarged spleen. Lumbar spondylosis with spinal stenosis and encroachment of neural foramina at the L4-L5 and L5-S1 levels. Electronically Signed   By: Ernie Avena M.D.   On: 07/10/2022 13:33    Procedures Procedures    Medications Ordered in ED Medications  ondansetron (ZOFRAN) injection 4 mg (has no administration in time range)  promethazine (  PHENERGAN) 25 MG/ML injection (has no administration in time range)  sodium chloride 0.9 % bolus 1,000 mL (0 mLs Intravenous Stopped 07/10/22 1138)  fentaNYL (SUBLIMAZE) injection 50 mcg (50 mcg Intravenous Given 07/10/22 1018)  ondansetron (ZOFRAN) injection 4 mg (4 mg Intravenous Given 07/10/22 1017)  promethazine (PHENERGAN) 25 mg in sodium chloride 0.9 % 50 mL IVPB (0 mg Intravenous Stopped 07/10/22 1434)  ketorolac (TORADOL) 30 MG/ML injection 30 mg (30 mg Intravenous Given 07/10/22 1304)  iohexol (OMNIPAQUE) 300 MG/ML solution 100 mL (100 mLs Intravenous Contrast Given 07/10/22 1254)    ED Course/ Medical Decision Making/ A&P Clinical Course as of 07/10/22 1741  Sat Jul 10, 2022  1241 After fluids and medications patient states she is slightly better.  Still having cramps and nausea.  Will put in for CT and orders  more medication. [MB]  1419 Patient states she is feeling better after another round of medications.  CT shows no acute findings other than ovarian cyst that will need outpatient ultrasound.  Reviewed this with patient.  Will send home with prescription for some nausea medication and note for work.  Return instructions discussed [MB]    Clinical Course User Index [MB] Terrilee Files, MD                           Medical Decision Making Amount and/or Complexity of Data Reviewed Labs: ordered. Radiology: ordered.  Risk Prescription drug management.  This patient complains of nausea vomiting diarrhea lightheadedness upper abdominal pain; this involves an extensive number of treatment Options and is a complaint that carries with it a high risk of complications and morbidity. The differential includes viral syndrome, food poisoning, dehydration, renal failure, metabolic derangement, gastritis, peptic ulcer disease, obstruction  I ordered, reviewed and interpreted labs, which included CBC with elevated white count normal hemoglobin, chemistries and LFTs normal other than mildly elevated glucose, urinalysis and pregnancy test negative I ordered medication IV fluids nausea medication pain medication with improvement in her symptoms and reviewed PMP when indicated. I ordered imaging studies which included CT abdomen and pelvis and I independently    visualized and interpreted imaging which showed right ovarian cyst Previous records obtained and reviewed in epic no recent admissions  Cardiac monitoring reviewed, normal sinus rhythm Social determinants considered, no significant barriers Critical Interventions: None  After the interventions stated above, I reevaluated the patient and found patient have a benign abdominal exam and stable vitals Admission and further testing considered, no indications for admission at this time.  Patient will continue nausea medication and hydration at home.   Bowel rest.  Return instructions discussed          Final Clinical Impression(s) / ED Diagnoses Final diagnoses:  Nausea vomiting and diarrhea  Upper abdominal pain  Right ovarian cyst    Rx / DC Orders ED Discharge Orders          Ordered    ondansetron (ZOFRAN-ODT) 8 MG disintegrating tablet  Every 8 hours PRN        07/10/22 1420              Terrilee Files, MD 07/10/22 1744

## 2022-07-10 NOTE — ED Notes (Signed)
Fluid/PO challenge passed.

## 2022-07-10 NOTE — ED Triage Notes (Signed)
Pt states she has been vomiting since last night with previous stomach cramps. Vomiting started at 3 am this morning. Endorses mariajuana use and smoked last night.

## 2022-07-10 NOTE — Discharge Instructions (Signed)
You were seen in the emergency department for nausea vomiting diarrhea and upper abdominal pain.  You had lab work urinalysis and a CAT scan.  You symptoms were improved after some medications.  He will need an outpatient ultrasound to follow-up your ovarian cyst.  Please follow-up with your regular doctor.  We are prescribing some nausea medication.  Return to the emergency department if any worsening or concerning symptoms.

## 2022-07-16 ENCOUNTER — Other Ambulatory Visit: Payer: Self-pay

## 2022-07-16 ENCOUNTER — Emergency Department (HOSPITAL_BASED_OUTPATIENT_CLINIC_OR_DEPARTMENT_OTHER): Payer: Medicaid Other

## 2022-07-16 ENCOUNTER — Emergency Department (HOSPITAL_BASED_OUTPATIENT_CLINIC_OR_DEPARTMENT_OTHER)
Admission: EM | Admit: 2022-07-16 | Discharge: 2022-07-16 | Disposition: A | Discharge status: Home / Self Care | Payer: Medicaid Other | Attending: Emergency Medicine | Admitting: Emergency Medicine

## 2022-07-16 ENCOUNTER — Encounter (HOSPITAL_BASED_OUTPATIENT_CLINIC_OR_DEPARTMENT_OTHER): Payer: Self-pay

## 2022-07-16 DIAGNOSIS — R1011 Right upper quadrant pain: Secondary | ICD-10-CM | POA: Diagnosis present

## 2022-07-16 DIAGNOSIS — R112 Nausea with vomiting, unspecified: Secondary | ICD-10-CM | POA: Insufficient documentation

## 2022-07-16 DIAGNOSIS — R197 Diarrhea, unspecified: Secondary | ICD-10-CM | POA: Insufficient documentation

## 2022-07-16 DIAGNOSIS — R109 Unspecified abdominal pain: Secondary | ICD-10-CM

## 2022-07-16 LAB — URINALYSIS, ROUTINE W REFLEX MICROSCOPIC
Bilirubin Urine: NEGATIVE
Glucose, UA: NEGATIVE mg/dL
Hgb urine dipstick: NEGATIVE
Ketones, ur: >80 mg/dL — AB
Leukocytes,Ua: NEGATIVE
Nitrite: NEGATIVE
Specific Gravity, Urine: 1.023 (ref 1.005–1.030)
pH: 8.5 — ABNORMAL HIGH (ref 5.0–8.0)

## 2022-07-16 LAB — CBC
HCT: 42 % (ref 36.0–46.0)
Hemoglobin: 13.8 g/dL (ref 12.0–15.0)
MCH: 25.8 pg — ABNORMAL LOW (ref 26.0–34.0)
MCHC: 32.9 g/dL (ref 30.0–36.0)
MCV: 78.7 fL — ABNORMAL LOW (ref 80.0–100.0)
Platelets: 265 10*3/uL (ref 150–400)
RBC: 5.34 MIL/uL — ABNORMAL HIGH (ref 3.87–5.11)
RDW: 15.2 % (ref 11.5–15.5)
WBC: 18.6 10*3/uL — ABNORMAL HIGH (ref 4.0–10.5)
nRBC: 0 % (ref 0.0–0.2)

## 2022-07-16 LAB — COMPREHENSIVE METABOLIC PANEL
ALT: 10 U/L (ref 0–44)
AST: 16 U/L (ref 15–41)
Albumin: 4.6 g/dL (ref 3.5–5.0)
Alkaline Phosphatase: 56 U/L (ref 38–126)
Anion gap: 15 (ref 5–15)
BUN: 10 mg/dL (ref 6–20)
CO2: 21 mmol/L — ABNORMAL LOW (ref 22–32)
Calcium: 9.2 mg/dL (ref 8.9–10.3)
Chloride: 102 mmol/L (ref 98–111)
Creatinine, Ser: 0.53 mg/dL (ref 0.44–1.00)
GFR, Estimated: >60 mL/min (ref >60)
Glucose, Bld: 146 mg/dL — ABNORMAL HIGH (ref 70–99)
Potassium: 3.1 mmol/L — ABNORMAL LOW (ref 3.5–5.1)
Sodium: 138 mmol/L (ref 135–145)
Total Bilirubin: 0.7 mg/dL (ref 0.3–1.2)
Total Protein: 7.6 g/dL (ref 6.5–8.1)

## 2022-07-16 LAB — HCG, SERUM, QUALITATIVE: Preg, Serum: NEGATIVE

## 2022-07-16 LAB — LIPASE, BLOOD: Lipase: <10 U/L — ABNORMAL LOW (ref 11–51)

## 2022-07-16 MED ORDER — DROPERIDOL 2.5 MG/ML IJ SOLN
2.5000 mg | Freq: Once | INTRAMUSCULAR | Status: AC
  Administered 2022-07-16: 2.5 mg via INTRAMUSCULAR
  Filled 2022-07-16: qty 2

## 2022-07-16 MED ORDER — IOHEXOL 300 MG/ML  SOLN
100.0000 mL | Freq: Once | INTRAMUSCULAR | Status: AC | PRN
  Administered 2022-07-16: 100 mL via INTRAVENOUS

## 2022-07-16 MED ORDER — SODIUM CHLORIDE 0.9 % IV BOLUS
1000.0000 mL | Freq: Once | INTRAVENOUS | Status: AC
  Administered 2022-07-16: 1000 mL via INTRAVENOUS

## 2022-07-16 NOTE — ED Triage Notes (Signed)
Patient here POV from Home.  Endorses N/V/D associated with ABD Pain. States this is her Fourth Visit related to Same Symptoms.   States Symptoms Began 6 days ago and states symptoms became better after Unknown IM Medications. States she ate a Development worker, community Item and drank Alcohol and Symptoms returned again.   Uncomfortable during Triage. A&Ox4. GCS 15. BIB Wheelchair.

## 2022-07-16 NOTE — ED Provider Notes (Signed)
[facility] EMERGENCY DEPT Provider Note   CSN: [id number] Arrival date & time: 07/16/22  1120     History  Chief Complaint  Patient presents with   Abdominal Pain   HPI Isabel Conner is a 26 y.o. female presenting with abdominal pain, nausea and vomiting. Symptoms started 1 week ago. Was seen here on Saturday and later in the week at [facility]. Symptoms have persisted and she cannot "keep anything down" prompting her to be seen today in the ED. She does endorse daily marijuana use. Denies chest pain or SOB. The pain is upper part of her abdomen, non radiating, and feels sharp and is constant. She has taken zofran intermittently which has helped a little. Denies hematuria. Did endorse diarrhea but states that diarrhea has improved over the past few days.    Abdominal Pain      Home Medications Prior to Admission medications   Medication Sig Start Date End Date Taking? Authorizing Provider  acetaminophen (TYLENOL) 500 MG tablet Take 2 tablets (1,000 mg total) by mouth every 6 (six) hours as needed. 06/07/21   Marylene Land, CNM  ibuprofen (ADVIL) 600 MG tablet Take 1 tablet (600 mg total) by mouth every 6 (six) hours as needed for mild pain. 10/13/21   Adline Potter, NP  Lactic Ac-Citric Ac-Pot Bitart (PHEXXI) 1.8-1-0.4 % GEL Place 1 Applicatorful vaginally as needed for up to 1 dose. 11/26/21   Cresenzo-Dishmon, Scarlette Calico, CNM  ondansetron (ZOFRAN-ODT) 8 MG disintegrating tablet Take 1 tablet (8 mg total) by mouth every 8 (eight) hours as needed for nausea or vomiting. 07/10/22   Terrilee Files, MD  topiramate (TOPAMAX) 25 MG tablet Take by mouth. 07/15/22   [provider]      Allergies    Amoxicillin, Penicillins, and Aleve [naproxen sodium]    Review of Systems   Review of Systems  Gastrointestinal:  Positive for abdominal pain.    Physical Exam Updated Vital Signs BP 119/70   Pulse 61   Temp 98.7 F (37.1 C) (Oral)    Resp 18   Ht 5\' 3"  (1.6 m)   Wt 102.5 kg   LMP 07/01/2022 (Approximate)   SpO2 99%   BMI 40.03 kg/m  Physical Exam Vitals and nursing note reviewed.  HENT:     Head: Normocephalic and atraumatic.     Mouth/Throat:     Mouth: Mucous membranes are moist.  Eyes:     General:        Right eye: No discharge.        Left eye: No discharge.     Conjunctiva/sclera: Conjunctivae normal.  Cardiovascular:     Rate and Rhythm: Normal rate and regular rhythm.     Pulses: Normal pulses.     Heart sounds: Normal heart sounds.  Pulmonary:     Effort: Pulmonary effort is normal.     Breath sounds: Normal breath sounds.  Abdominal:     General: Abdomen is flat.     Palpations: Abdomen is soft.     Tenderness: There is abdominal tenderness in the right upper quadrant.  Skin:    General: Skin is warm and dry.  Neurological:     General: No focal deficit present.  Psychiatric:        Mood and Affect: Mood normal.     ED Results / Procedures / Treatments   Labs (all labs ordered are listed, but only abnormal results are displayed) Labs Reviewed  LIPASE, BLOOD - Abnormal;  Notable for the following components:      Result Value   Lipase <10 (*)    All other components within normal limits  COMPREHENSIVE METABOLIC PANEL - Abnormal; Notable for the following components:   Potassium 3.1 (*)    CO2 21 (*)    Glucose, Bld 146 (*)    All other components within normal limits  CBC - Abnormal; Notable for the following components:   WBC 18.6 (*)    RBC 5.34 (*)    MCV 78.7 (*)    MCH 25.8 (*)    All other components within normal limits  URINALYSIS, ROUTINE W REFLEX MICROSCOPIC - Abnormal; Notable for the following components:   pH 8.5 (*)    Ketones, ur >80 (*)    Protein, ur TRACE (*)    All other components within normal limits  HCG, SERUM, QUALITATIVE    EKG Revealed NSR  Radiology CT Abdomen Pelvis W Contrast  Result Date: 07/16/2022 CLINICAL DATA:  Nausea, vomiting EXAM:  CT ABDOMEN AND PELVIS WITH CONTRAST TECHNIQUE: Multidetector CT imaging of the abdomen and pelvis was performed using the standard protocol following bolus administration of intravenous contrast. RADIATION DOSE REDUCTION: This exam was performed according to the departmental dose-optimization program which includes automated exposure control, adjustment of the mA and/or kV according to patient size and/or use of iterative reconstruction technique. CONTRAST:  OMNIPAQUE IOHEXOL 300 MG/ML  SOLN COMPARISON:  07/10/2022 FINDINGS: Lower chest: No acute abnormality. Hepatobiliary: No solid liver abnormality is seen. No gallstones, gallbladder wall thickening, or biliary dilatation. Pancreas: Unremarkable. No pancreatic ductal dilatation or surrounding inflammatory changes. Spleen: Mild splenomegaly, maximum coronal span 13.5 cm. Adrenals/Urinary Tract: Adrenal glands are unremarkable. Kidneys are normal, without renal calculi, solid lesion, or hydronephrosis. Bladder is unremarkable. Stomach/Bowel: Stomach is within normal limits. Appendix appears normal. No evidence of bowel wall thickening, distention, or inflammatory changes. Vascular/Lymphatic: No significant vascular findings are present. No enlarged abdominal or pelvic lymph nodes. Reproductive: No solid mass. Redemonstrated right ovarian lesions, of simple fluid attenuation and intermediate attenuation measuring 7.7 x 6.8 cm (series 2, image 67) and 3.5 x 3.0 cm respectively (series 2, image 63). Other: No abdominal wall hernia or abnormality. No ascites. Musculoskeletal: No acute or significant osseous findings. IMPRESSION: 1. No acute CT findings of the abdomen or pelvis to explain nausea or vomiting. 2. Redemonstrated right ovarian lesions, of simple fluid attenuation measuring 7.7 x 6.8 cm and intermediate attenuation measuring 3.5 x 3.0 cm. These may represent hemorrhagic or proteinaceous cysts, however are incompletely characterized. Recommend dedicated  pelvic ultrasound to further evaluate and exclude solid lesion, which can be performed on a nonemergent outpatient basis. 3. Mild splenomegaly. Electronically Signed   By: Jearld Lesch M.D.   On: 07/16/2022 16:12   US Abdomen Limited RUQ (LIVER/GB)  Result Date: 07/16/2022 CLINICAL DATA:  Severe epigastric pain 1 year. EXAM: ULTRASOUND ABDOMEN LIMITED RIGHT UPPER QUADRANT COMPARISON:  None Available. FINDINGS: Gallbladder: Mild cholelithiasis with largest stone measuring 7 mm. Borderline wall thickening measuring 3.3 mm. No adjacent free fluid. Mild tenderness directly over the gallbladder. Common bile duct: Diameter: 4.7 mm. Liver: Mild diffuse increased parenchymal echogenicity compatible with a degree of steatosis without focal mass. Portal vein is patent on color Doppler imaging with normal direction of blood flow towards the liver. Other: No free fluid. IMPRESSION: Mild cholelithiasis with borderline wall thickening. No convincing evidence of acute cholecystitis as recommend clinical correlation. Electronically Signed   By: Elberta Fortis M.D.  On: 07/16/2022 14:57    Procedures Procedures    Medications Ordered in ED Medications  sodium chloride 0.9 % bolus 1,000 mL (0 mLs Intravenous Stopped 07/16/22 1508)  droperidol (INAPSINE) 2.5 MG/ML injection 2.5 mg (2.5 mg Intramuscular Given 07/16/22 1408)  sodium chloride 0.9 % bolus 1,000 mL (1,000 mLs Intravenous New Bag/Given 07/16/22 1507)  iohexol (OMNIPAQUE) 300 MG/ML solution 100 mL (100 mLs Intravenous Contrast Given 07/16/22 1555)    ED Course/ Medical Decision Making/ A&P                           Medical Decision Making Amount and/or Complexity of Data Reviewed Labs: ordered. Radiology: ordered.    This patient presents to the ED for concern of abdominal pain, this involves a number of treatment options, and is a complaint that carries with it a high risk of complications and morbidity. The differential diagnosis includes  pancreatitis, gall bladder pathology, ACS, and cannabinoid hyperemesis.    Co morbidities: Discussed in HPI    EMR reviewed including pt PMHx, past surgical history and past visits to ER. Present to ED on 8/12 for the same complaint, work up unremarkable  with reassuring CT ab/pelvis.   See HPI for more details   Lab Tests:   I ordered and independently interpreted labs. Labs notable for ketonuria likely related to dehydration.    Imaging Studies:  Abnormal findings. I personally reviewed all imaging studies. U/S notable for mild cholelithiasis.     Cardiac Monitoring:  The patient was maintained on a cardiac monitor.  I personally viewed and interpreted the cardiac monitored which showed an underlying rhythm of: NSR EKG non-ischemic   Medicines ordered:  I ordered medication including droperidol for vomiting/nausea, 2L NS bolus for volume resuscitation  Reevaluation of the patient after these medicines showed that the patient improved I have reviewed the patients home medicines and have made adjustments as needed   Critical Interventions:  NA   Consults/Attending Physician   I discussed this case with my attending physician who cosigned this note including patient's presenting symptoms, physical exam, and planned diagnostics and interventions. Attending physician stated agreement with plan or made changes to plan which were implemented.   Reevaluation:  After the interventions noted above I re-evaluated patient and found that they have :improved    Problem List / ED Course: Patient presented with abdominal pain and a week of persistent nausea and vomiting. Concern for cannabinoid hyperemesis given recent history of frequent marijuana use and persistent violent vomiting. Exam elicited TTP in RUQ which prompted eval with u/s. U/s revealed concern for cholelithiasis. CT ab/pelvis was negative. Symptoms improved after administration of 2L bolus and droperidol.  Symptoms and history consistent with cannabinoid hyperemesis. Discussed likely diagnosis with patient along with precautionary measures to return to the ED. Offered zofran but patient declined stating that she already has a supply from last visit.    Dispostion:  After consideration of the diagnostic results and the patients response to treatment, I feel that the patent would benefit from discharge and f/u with PCP         Final Clinical Impression(s) / ED Diagnoses Final diagnoses:  Abdominal pain, unspecified abdominal location    Rx / DC Orders ED Discharge Orders     None         Gareth Eagle, PA-C 07/16/22 1658    Alvira Monday, MD 07/16/22 2257

## 2022-07-16 NOTE — Discharge Instructions (Addendum)

## 2022-07-16 NOTE — ED Notes (Signed)
Patient ambulated to restroom, steady gait.

## 2022-07-16 NOTE — ED Notes (Signed)
Reviewed AVS/discharge instruction with patient. Time allotted for and all questions answered. Patient is agreeable for d/c and escorted to ed exit by staff.  

## 2022-08-13 IMAGING — CT CT ABD-PELV W/ CM
2 of 4 series · 16 of 46 positions shown, 18 images · IV contrast (APPLIED)
Comparison: None.

CLINICAL DATA: Vomiting since eating Chinese food last night.

EXAM:
CT ABDOMEN AND PELVIS WITH CONTRAST
TECHNIQUE: Multidetector CT imaging of the abdomen and pelvis was performed
using the standard protocol following bolus administration of
intravenous contrast.

[Series 2: abd pel w · axial · 0.92mm/px · z∈[-429,+66]mm · 13 of 109 slices shown, 15 images]
[im 5/109  soft-tissue]
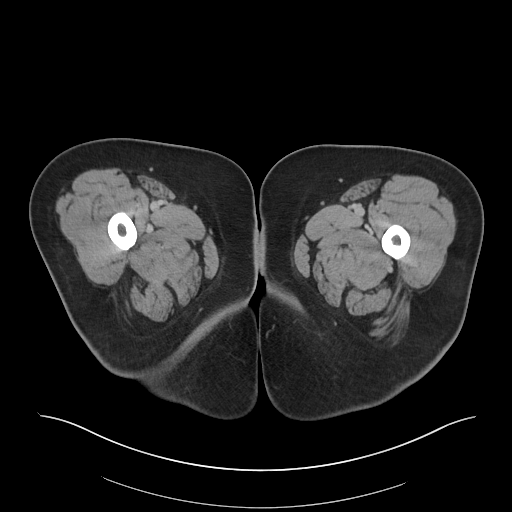
[im 5/109  bone]
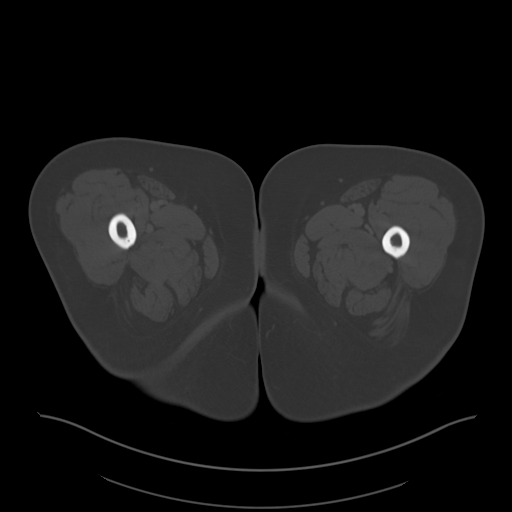
[im 14/109  soft-tissue]
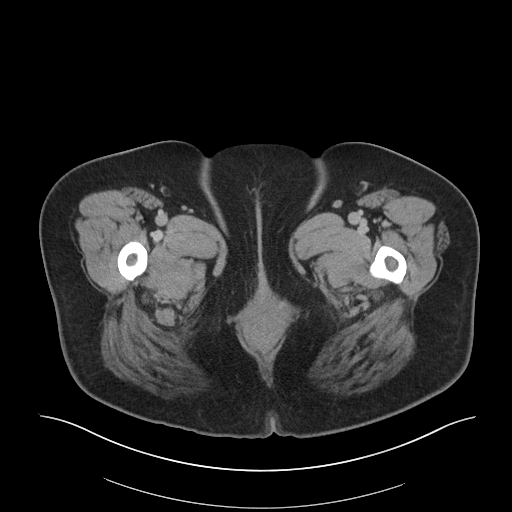
[im 23/109  soft-tissue]
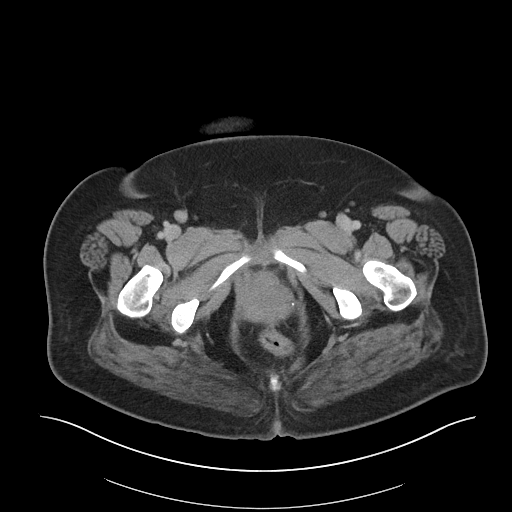
[im 32/109  soft-tissue]
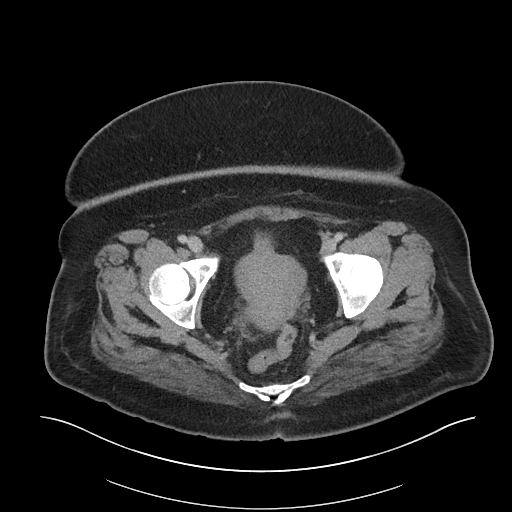
[im 37/109  soft-tissue]
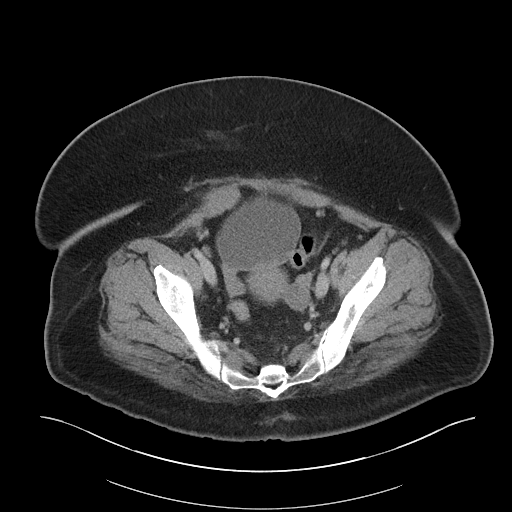
[im 46/109  soft-tissue]
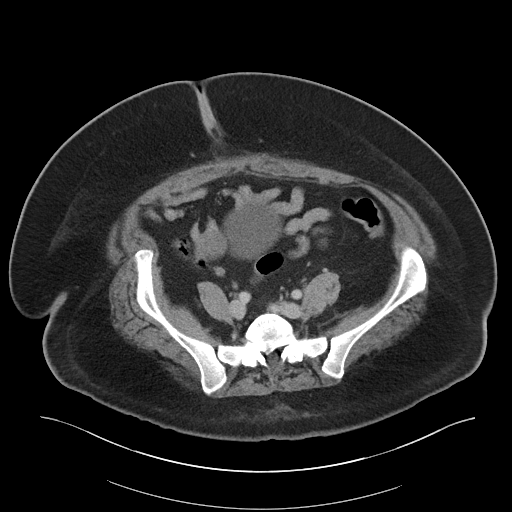
[im 55/109  soft-tissue]
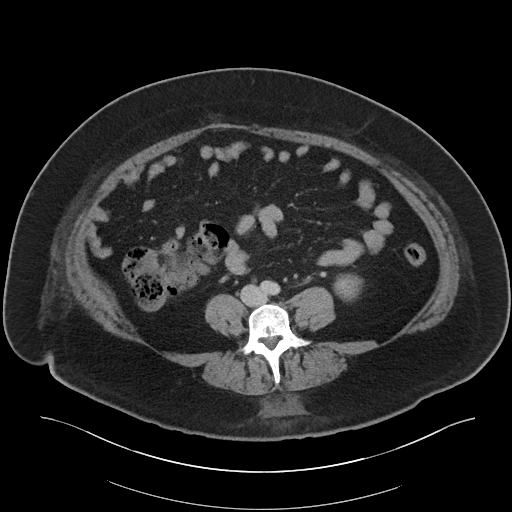
[im 64/109  soft-tissue]
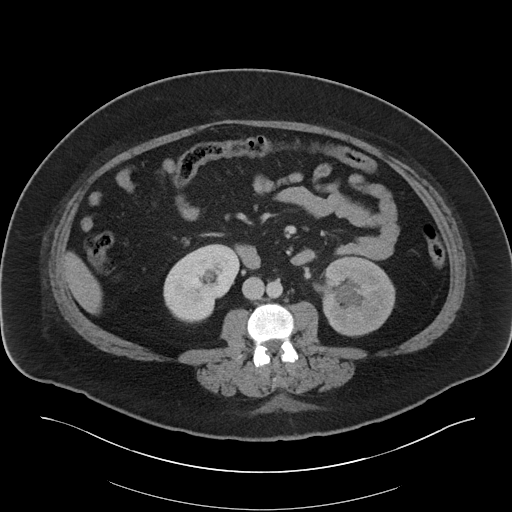
[im 73/109  soft-tissue]
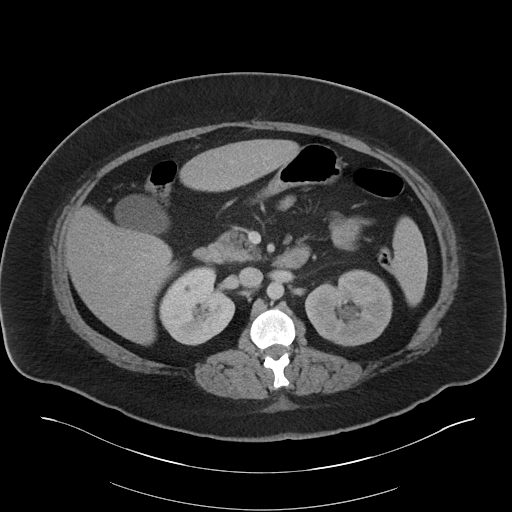
[im 73/109  bone]
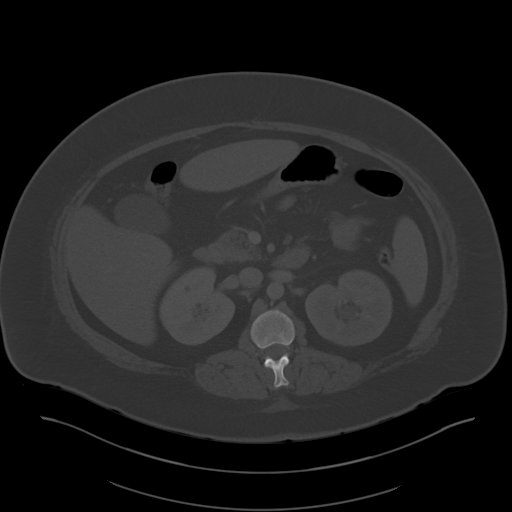
[im 77/109  soft-tissue]
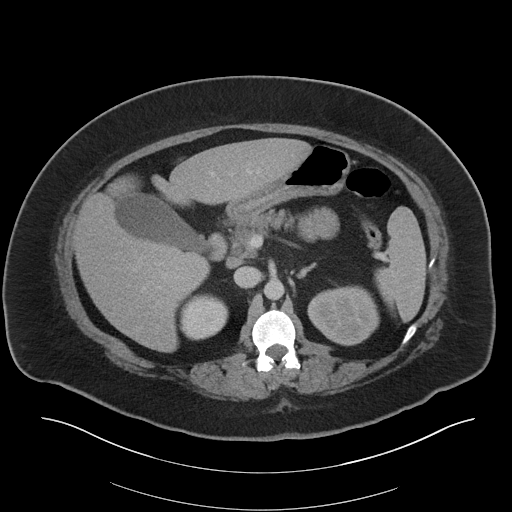
[im 86/109  soft-tissue]
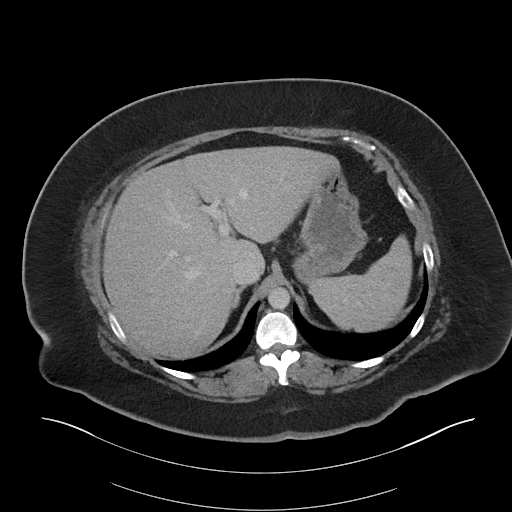
[im 95/109  soft-tissue]
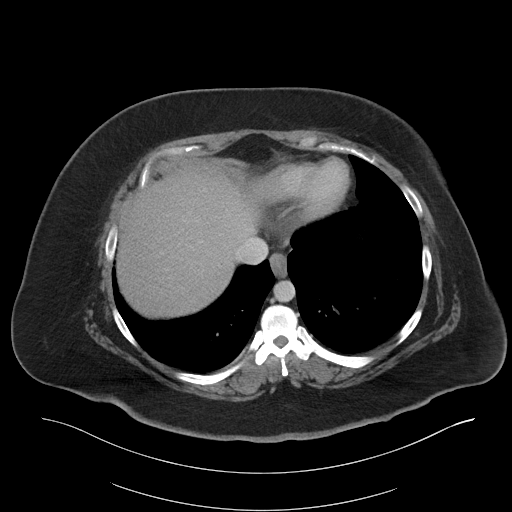
[im 104/109  soft-tissue]
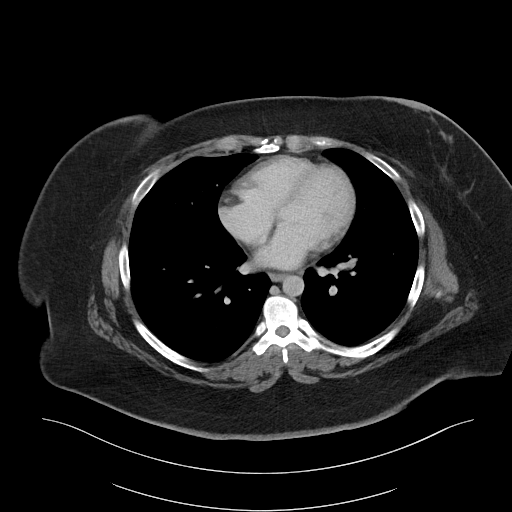

[Series 5: coronal · coronal · 0.96mm/px · 3 of 116 slices shown]
[im 39/116  soft-tissue]
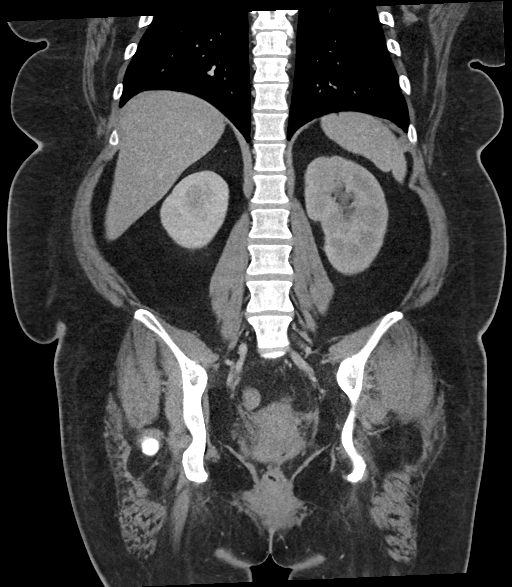
[im 52/116  soft-tissue]
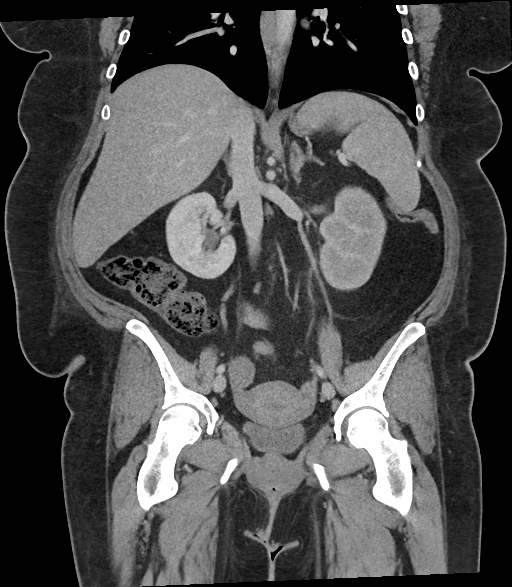
[im 64/116  soft-tissue]
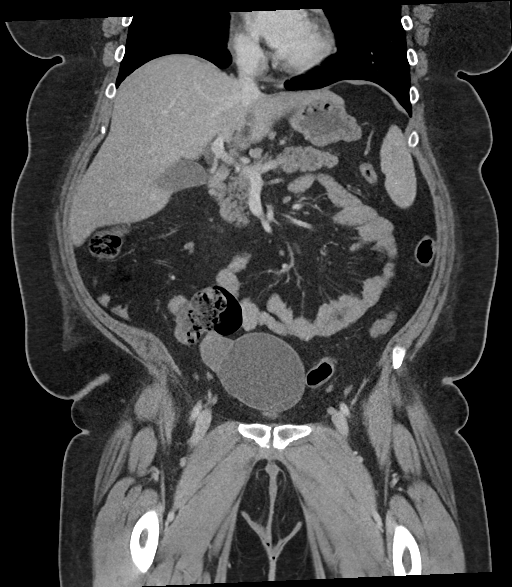

[16 of 46 positions shown; findings below may reference images not displayed]

RADIATION DOSE REDUCTION: This exam was performed according to the
departmental dose-optimization program which includes automated
exposure control, adjustment of the mA and/or kV according to
patient size and/or use of iterative reconstruction technique.

CONTRAST:  100mL OMNIPAQUE IOHEXOL 300 MG/ML  SOLN
FINDINGS: Lower chest: Unremarkable.

Hepatobiliary: No focal liver abnormality is seen. No gallstones,
gallbladder wall thickening, or biliary dilatation.

Pancreas: Unremarkable. No pancreatic ductal dilatation or
surrounding inflammatory changes.

Spleen: Normal in size without focal abnormality.

Adrenals/Urinary Tract: Normal appearing adrenal glands. 4 mm
calculus in the proximal ureter on the left at the UPJ.
Mild-to-moderate dilatation of the left renal collecting system.
Mild diffuse low density in enlargement of the left kidney compared
to the right kidney.

Small, exophytic right renal cyst. Additional smaller cortical cyst.
Unremarkable right ureter, mid and distal left ureter and urinary
bladder. Multiple small bilateral pelvic phleboliths.

Stomach/Bowel: Stomach is within normal limits. Appendix appears
normal. No evidence of bowel wall thickening, distention, or
inflammatory changes.

Vascular/Lymphatic: No significant vascular findings are present. No
enlarged abdominal or pelvic lymph nodes.

Reproductive: Uterus and bilateral adnexa are unremarkable.

Other: No abdominal wall hernia or abnormality. No abdominopelvic
ascites.

Musculoskeletal: Disc space narrowing and posterior spur formation
at the L4-5 and L5-S1 levels. Minimal lower thoracic spine
degenerative changes.
IMPRESSION: 1. 4 mm left UPJ calculus causing mild to moderate left
hydronephrosis and obstructive changes of the left kidney.
2. L4-5 and L5-S1 degenerative changes with moderate posterior disc
protrusion and spur formation.
3. Otherwise, unremarkable examination.

## 2022-12-15 ENCOUNTER — Other Ambulatory Visit: Payer: Self-pay | Admitting: Advanced Practice Midwife

## 2022-12-28 ENCOUNTER — Encounter: Payer: Self-pay | Admitting: Adult Health

## 2022-12-28 ENCOUNTER — Ambulatory Visit: Payer: Medicaid Other | Admitting: Adult Health

## 2022-12-28 VITALS — BP 126/75 | HR 80 | Ht 63.0 in | Wt 221.5 lb

## 2022-12-28 DIAGNOSIS — R102 Pelvic and perineal pain unspecified side: Secondary | ICD-10-CM | POA: Insufficient documentation

## 2022-12-28 DIAGNOSIS — O3680X0 Pregnancy with inconclusive fetal viability, not applicable or unspecified: Secondary | ICD-10-CM

## 2022-12-28 DIAGNOSIS — R350 Frequency of micturition: Secondary | ICD-10-CM | POA: Diagnosis not present

## 2022-12-28 DIAGNOSIS — Z3201 Encounter for pregnancy test, result positive: Secondary | ICD-10-CM | POA: Diagnosis not present

## 2022-12-28 DIAGNOSIS — Z8759 Personal history of other complications of pregnancy, childbirth and the puerperium: Secondary | ICD-10-CM | POA: Insufficient documentation

## 2022-12-28 DIAGNOSIS — Z3A00 Weeks of gestation of pregnancy not specified: Secondary | ICD-10-CM

## 2022-12-28 DIAGNOSIS — N926 Irregular menstruation, unspecified: Secondary | ICD-10-CM | POA: Insufficient documentation

## 2022-12-28 LAB — POCT URINALYSIS DIPSTICK
Blood, UA: NEGATIVE
Glucose, UA: NEGATIVE
Ketones, UA: NEGATIVE
Leukocytes, UA: NEGATIVE
Nitrite, UA: NEGATIVE
Protein, UA: POSITIVE — AB

## 2022-12-28 LAB — POCT URINE PREGNANCY: Preg Test, Ur: POSITIVE — AB

## 2022-12-28 NOTE — Progress Notes (Signed)
  Subjective:     Patient ID: Isabel Conner, female   DOB: 07-21-1996, 27 y.o.   MRN: [id number]  HPI Odeth is a 27 year old white female,single,[id number], in complaining of urinary frequency, pelvic pressure and pain left side, has hx right ovarian cyst, and has not had period in January.   Last pap was negative HPV, NILM 05/18/21.  PCP is [facility].  Review of Systems +urinary frequency +pain left side +pelvic pressure +missed period Reviewed past medical,surgical, social and family history. Reviewed medications and allergies.     Objective:   Physical Exam BP 126/75 (BP Location: Left Arm, Patient Position: Sitting, Cuff Size: Normal)   Pulse 80   Ht 5\' 3"  (1.6 m)   Wt 221 lb 8 oz (100.5 kg)   LMP 11/12/2022 (Approximate)   Breastfeeding No   BMI 39.24 kg/m  UPT is +. Urine dipstick trace protein. She cried with +UPT, had used phexxi, and when did not took Plan B.  About 6+4 weeks by LMP with EDD 08/19/23. Blood type is A+ in EPIC. Skin warm and dry. Neck: mid line trachea, normal thyroid, good ROM, no lymphadenopathy noted. Lungs: clear to ausculation bilaterally. Cardiovascular: regular rate and rhythm.    Abdomen is soft and non tender, no pain. Fall risk is low  Upstream - 12/28/22 1411       Pregnancy Intention Screening   Does the patient want to become pregnant in the next year? N/A    Does the patient's partner want to become pregnant in the next year? N/A    Would the patient like to discuss contraceptive options today? No      Contraception Wrap Up   Current Method Withdrawal or Other Method;Pregnant/Seeking Pregnancy   Phexxi   End Method Withdrawal or Other Method;Pregnant/Seeking Pregnancy   Phexxi   Contraception Counseling Provided No             Assessment:     1. Urinary frequency - POCT Urinalysis Dipstick  2. Pelvic pain Had pain left side, non tender now Will check labs and get [place], [place] scheduled for 12/29/22 at [facility] at 11 am - POCT  Urinalysis Dipstick - CBC - Comprehensive metabolic panel - US OB Comp Less 14 Wks; Future  3. Pelvic pressure in female - POCT Urinalysis Dipstick  4. Pregnancy examination or test, positive result Take OTC PNV Will check labs  - POCT urine pregnancy - CBC - Comprehensive metabolic panel - Beta hCG quant (ref lab)  5. Missed period +UPT  6. History of ectopic pregnancy [place] scheduled for 12/29/22 at [facility] at 11 am  Will talk when results back  - US OB Comp Less 14 Wks; Future  7. Encounter to determine fetal viability of pregnancy, single or unspecified fetus [place] scheduled for 12/29/22 at [facility] at 11 am  - US OB Comp Less 14 Wks; Future     Plan:    Review OB packet Will talk when [place] and labs back Follow up TBD

## 2022-12-29 ENCOUNTER — Ambulatory Visit (HOSPITAL_BASED_OUTPATIENT_CLINIC_OR_DEPARTMENT_OTHER)
Admission: RE | Admit: 2022-12-29 | Discharge: 2022-12-29 | Disposition: A | Discharge status: Home / Self Care | Payer: Medicaid Other | Source: Ambulatory Visit | Attending: Adult Health | Admitting: Adult Health

## 2022-12-29 ENCOUNTER — Other Ambulatory Visit: Payer: Self-pay | Admitting: Adult Health

## 2022-12-29 DIAGNOSIS — O3680X0 Pregnancy with inconclusive fetal viability, not applicable or unspecified: Secondary | ICD-10-CM | POA: Diagnosis present

## 2022-12-29 DIAGNOSIS — Z3201 Encounter for pregnancy test, result positive: Secondary | ICD-10-CM

## 2022-12-29 DIAGNOSIS — Z8759 Personal history of other complications of pregnancy, childbirth and the puerperium: Secondary | ICD-10-CM | POA: Diagnosis present

## 2022-12-29 DIAGNOSIS — R102 Pelvic and perineal pain: Secondary | ICD-10-CM

## 2022-12-29 LAB — CBC
Hematocrit: 43.2 % (ref 34.0–46.6)
Hemoglobin: 14 g/dL (ref 11.1–15.9)
MCH: 27 pg (ref 26.6–33.0)
MCHC: 32.4 g/dL (ref 31.5–35.7)
MCV: 83 fL (ref 79–97)
Platelets: 242 10*3/uL (ref 150–450)
RBC: 5.19 x10E6/uL (ref 3.77–5.28)
RDW: 14.3 % (ref 11.7–15.4)
WBC: 12.4 10*3/uL — ABNORMAL HIGH (ref 3.4–10.8)

## 2022-12-29 LAB — COMPREHENSIVE METABOLIC PANEL
ALT: 14 IU/L (ref 0–32)
AST: 19 IU/L (ref 0–40)
Albumin/Globulin Ratio: 1.6 (ref 1.2–2.2)
Albumin: 4.1 g/dL (ref 4.0–5.0)
Alkaline Phosphatase: 69 IU/L (ref 44–121)
BUN/Creatinine Ratio: 16 (ref 9–23)
BUN: 9 mg/dL (ref 6–20)
Bilirubin Total: <0.2 mg/dL (ref 0.0–1.2)
CO2: 18 mmol/L — ABNORMAL LOW (ref 20–29)
Calcium: 9.2 mg/dL (ref 8.7–10.2)
Chloride: 105 mmol/L (ref 96–106)
Creatinine, Ser: 0.56 mg/dL — ABNORMAL LOW (ref 0.57–1.00)
Globulin, Total: 2.5 g/dL (ref 1.5–4.5)
Glucose: 91 mg/dL (ref 70–99)
Potassium: 4.2 mmol/L (ref 3.5–5.2)
Sodium: 141 mmol/L (ref 134–144)
Total Protein: 6.6 g/dL (ref 6.0–8.5)
eGFR: 127 mL/min/{1.73_m2} (ref >59)

## 2022-12-29 LAB — BETA HCG QUANT (REF LAB): hCG Quant: 250 m[IU]/mL

## 2022-12-29 NOTE — Progress Notes (Signed)
Ck [facility] tomorrow

## 2023-01-01 LAB — BETA HCG QUANT (REF LAB): hCG Quant: 292 m[IU]/mL

## 2023-01-03 ENCOUNTER — Other Ambulatory Visit: Payer: Self-pay | Admitting: Adult Health

## 2023-01-03 DIAGNOSIS — Z3201 Encounter for pregnancy test, result positive: Secondary | ICD-10-CM

## 2023-01-04 ENCOUNTER — Other Ambulatory Visit: Payer: Self-pay | Admitting: Adult Health

## 2023-01-04 MED ORDER — PRENATAL PLUS 27-1 MG PO TABS: 1.0000 | ORAL_TABLET | Freq: Every day | ORAL | 12 refills | Status: DC

## 2023-01-04 NOTE — Progress Notes (Signed)
Rx PNV ?

## 2023-01-05 LAB — BETA HCG QUANT (REF LAB): hCG Quant: 404 m[IU]/mL

## 2023-01-07 ENCOUNTER — Other Ambulatory Visit: Payer: Self-pay | Admitting: Adult Health

## 2023-01-07 DIAGNOSIS — Z3201 Encounter for pregnancy test, result positive: Secondary | ICD-10-CM

## 2023-01-07 NOTE — Progress Notes (Signed)
Ck QHCG

## 2023-01-08 ENCOUNTER — Inpatient Hospital Stay (HOSPITAL_COMMUNITY)
Admission: AD | Admit: 2023-01-08 | Discharge: 2023-01-08 | Disposition: A | Discharge status: Home / Self Care | Payer: Medicaid Other | Attending: Obstetrics & Gynecology | Admitting: Obstetrics & Gynecology

## 2023-01-08 ENCOUNTER — Encounter (HOSPITAL_COMMUNITY): Payer: Self-pay | Admitting: Obstetrics & Gynecology

## 2023-01-08 ENCOUNTER — Inpatient Hospital Stay (HOSPITAL_COMMUNITY): Payer: Medicaid Other

## 2023-01-08 DIAGNOSIS — Z3A01 Less than 8 weeks gestation of pregnancy: Secondary | ICD-10-CM | POA: Diagnosis not present

## 2023-01-08 DIAGNOSIS — N83291 Other ovarian cyst, right side: Secondary | ICD-10-CM | POA: Diagnosis not present

## 2023-01-08 DIAGNOSIS — O3481 Maternal care for other abnormalities of pelvic organs, first trimester: Secondary | ICD-10-CM | POA: Diagnosis not present

## 2023-01-08 DIAGNOSIS — O2691 Pregnancy related conditions, unspecified, first trimester: Secondary | ICD-10-CM | POA: Diagnosis not present

## 2023-01-08 DIAGNOSIS — O0281 Inappropriate change in quantitative human chorionic gonadotropin (hCG) in early pregnancy: Secondary | ICD-10-CM | POA: Diagnosis not present

## 2023-01-08 DIAGNOSIS — O26851 Spotting complicating pregnancy, first trimester: Secondary | ICD-10-CM | POA: Diagnosis present

## 2023-01-08 HISTORY — DX: Calculus of kidney: N20.0

## 2023-01-08 HISTORY — DX: Unspecified ovarian cyst, unspecified side: N83.209

## 2023-01-08 LAB — HCG, QUANTITATIVE, PREGNANCY: hCG, Beta Chain, Quant, S: 626 m[IU]/mL — ABNORMAL HIGH (ref <5)

## 2023-01-08 LAB — CBC
HCT: 41 % (ref 36.0–46.0)
Hemoglobin: 13.4 g/dL (ref 12.0–15.0)
MCH: 27.1 pg (ref 26.0–34.0)
MCHC: 32.7 g/dL (ref 30.0–36.0)
MCV: 83 fL (ref 80.0–100.0)
Platelets: 234 10*3/uL (ref 150–400)
RBC: 4.94 MIL/uL (ref 3.87–5.11)
RDW: 14.3 % (ref 11.5–15.5)
WBC: 9.7 10*3/uL (ref 4.0–10.5)
nRBC: 0 % (ref 0.0–0.2)

## 2023-01-08 NOTE — MAU Provider Note (Signed)
History     CSN: [id number]  Arrival date and time: 01/08/23 1601   None     Chief Complaint  Patient presents with   Vaginal Bleeding   CAMI HEATHCOTE , a  27 y.o. [id number] at 64w1dpresents to MAU with complaints of vaginal spotting that has been on-going. Patient states it was previously brown but last night turned bright red. She denies saturating a pad or passing clots. She denies pain or burning with urination, abnormal vaginal discharge or abdominal pain. She reports intermittent pain yesterday but none today. She has no other complaints.          OB History     Gravida  6   Para  1   Term  1   Preterm      AB  4   Living  1      SAB  1   IAB  1   Ectopic  2   Multiple  0   Live Births  1        Obstetric Comments  Reports the ectopic pregnancies "passed naturally"; no surgery or methotrexate         Past Medical History:  Diagnosis Date   Anal fistula    Chronic back pain    Gestational diabetes    Kidney stone    Obesity    Ovarian cyst     Past Surgical History:  Procedure Laterality Date   ADENOIDECTOMY     CESAREAN SECTION N/A 09/26/2021   Procedure: CESAREAN SECTION;  Surgeon: AWoodroe Mode MD;  Location: [facility] LD ORS;  Service: Obstetrics;  Laterality: N/A;   TONSILLECTOMY      Family History  Problem Relation Age of Onset   Diabetes Mother    Hypertension Mother    Pulmonary embolism Mother        caused death   Hypertension Father    Diabetes Maternal Grandmother    Diabetes Paternal Grandmother     Social History   Tobacco Use   Smoking status: Never   Smokeless tobacco: Never  Vaping Use   Vaping Use: Never used  Substance Use Topics   Alcohol use: Not Currently    Alcohol/week: 3.0 standard drinks of alcohol    Types: 3 Standard drinks or equivalent per week    Comment: occ   Drug use: Not Currently    Types: Marijuana    Comment: quit in December 2021    Allergies:  Allergies  Allergen Reactions    Amoxicillin Anaphylaxis   Penicillins Anaphylaxis    Has patient had a PCN reaction causing immediate rash, facial/tongue/throat swelling, SOB or lightheadedness with hypotension: Yes Has patient had a PCN reaction causing severe rash involving mucus membranes or skin necrosis: No Has patient had a PCN reaction that required hospitalization No Has patient had a PCN reaction occurring within the last 10 years: No If all of the above answers are "NO", then may proceed with Cephalosporin use.    Aleve [Naproxen Sodium] Other (See Comments)    Chest pain. Pt states she can take Iburprofen    Medications Prior to Admission  Medication Sig Dispense Refill Last Dose   prenatal vitamin w/FE, FA (PRENATAL 1 + 1) 27-1 MG TABS tablet Take 1 tablet by mouth daily at 12 noon. 30 tablet 12 01/07/2023   acetaminophen (TYLENOL) 500 MG tablet Take 2 tablets (1,000 mg total) by mouth every 6 (six) hours as needed. 60 tablet 0  Blood Glucose Monitoring Suppl DEVI Use daily to check for blood sugars 250.00      glucose blood test strip See admin instructions.      Lactic Ac-Citric Ac-Pot Bitart (PHEXXI) 1.8-1-0.4 % GEL PLACE 1 APPLICATORFUL VAGINALLY AS NEEDED FOR UP TO 1 DOSE (Patient not taking: Reported on 12/28/2022) 60 g 12     Review of Systems  Constitutional:  Negative for chills, fatigue and fever.  Eyes:  Negative for pain and visual disturbance.  Respiratory:  Negative for apnea, shortness of breath and wheezing.   Cardiovascular:  Negative for chest pain and palpitations.  Gastrointestinal:  Negative for abdominal pain, constipation, diarrhea, nausea and vomiting.  Genitourinary:  Positive for vaginal bleeding. Negative for difficulty urinating, dysuria, pelvic pain, vaginal discharge and vaginal pain.  Musculoskeletal:  Negative for back pain.  Neurological:  Negative for seizures, weakness and headaches.  Psychiatric/Behavioral:  Negative for suicidal ideas.    Physical Exam   Blood  pressure (!) 113/59, pulse 79, temperature 98.6 F (37 C), temperature source Oral, resp. rate 18, height 5' 3"$  (1.6 m), weight 102.4 kg, last menstrual period 11/12/2022, SpO2 100 %, not currently breastfeeding.  Physical Exam Vitals and nursing note reviewed.  Constitutional:      General: She is not in acute distress.    Appearance: Normal appearance.  HENT:     Head: Normocephalic.  Cardiovascular:     Rate and Rhythm: Normal rate and regular rhythm.  Pulmonary:     Effort: Pulmonary effort is normal.  Musculoskeletal:        General: Normal range of motion.     Cervical back: Normal range of motion.  Skin:    General: Skin is warm and dry.     Capillary Refill: Capillary refill takes less than 2 seconds.  Neurological:     Mental Status: She is alert and oriented to person, place, and time.  Psychiatric:        Mood and Affect: Mood normal.     MAU Course  Procedures Orders Placed This Encounter  Procedures   US OB LESS THAN 14 WEEKS WITH OB TRANSVAGINAL   CBC   hCG, quantitative, pregnancy   Discharge patient   Results for orders placed or performed during the hospital encounter of 01/08/23 (from the past 24 hour(s))  CBC     Status: None   Collection Time: 01/08/23  4:28 PM  Result Value Ref Range   WBC 9.7 4.0 - 10.5 K/uL   RBC 4.94 3.87 - 5.11 MIL/uL   Hemoglobin 13.4 12.0 - 15.0 g/dL   HCT 41.0 36.0 - 46.0 %   MCV 83.0 80.0 - 100.0 fL   MCH 27.1 26.0 - 34.0 pg   MCHC 32.7 30.0 - 36.0 g/dL   RDW 14.3 11.5 - 15.5 %   Platelets 234 150 - 400 K/uL   nRBC 0.0 0.0 - 0.2 %  hCG, quantitative, pregnancy     Status: Abnormal   Collection Time: 01/08/23  4:28 PM  Result Value Ref Range   hCG, Beta Chain, Quant, S 626 (H) <5 mIU/mL   US OB LESS THAN 14 WEEKS WITH OB TRANSVAGINAL  Result Date: 01/08/2023 CLINICAL DATA:  Vaginal bleeding EXAM: OBSTETRIC <14 WK [place] AND TRANSVAGINAL OB US TECHNIQUE: Both transabdominal and transvaginal ultrasound examinations were  performed for complete evaluation of the gestation as well as the maternal uterus, adnexal regions, and pelvic cul-de-sac. Transvaginal technique was performed to assess early pregnancy. COMPARISON:  Obstetrical  ultrasound 12/29/2022 FINDINGS: Intrauterine gestational sac: Single Yolk sac:  Not Visualized. Embryo:  Not Visualized. MSD: 6.0  mm   5 w   2 d Subchorionic hemorrhage:  None visualized. Maternal uterus/adnexae: There is a 5.8 x 4.1 x 8.7 cm cystic mass superior to the uterus. Right ovarian cyst with seen on prior ultrasound, similar in size. The right and left ovaries are otherwise within normal limits. Cervix is within normal limits. No pelvic free fluid. IMPRESSION: 1. Questionable empty gestational sac again identified which has only minimally increased in size when compared to scan 10 days ago. Findings are suspicious for failed intrauterine pregnancy. Recommend correlation with serial beta hCG and short-term follow-up ultrasound to confirm. 2. 8.7 cm right ovarian/paraovarian cyst appears unchanged from prior study. Electronically Signed   By: Ronney Asters M.D.   On: 01/08/2023 17:42    MDM - CBC normal. Patient hemodynamically stable.  - Quant in MAU is 626. Last America Brown available is from 4 days prior and those results were 404. Increase in quant but not doubled.  - US revealed a gestational sac with minimal development from 10 days ago.  - Suspicion for failed pregnancy, but not definitive.  - Consulted Dr. Roselie Awkward, on patient presentation and reviewed lab results. Per MD follow up with a Quant in 48 hours and give miscarriage, and bleeding precautions.  - Plan for discharge.   Assessment and Plan   1. Less than [redacted] weeks gestation of pregnancy   2. Elevated level of quantitative hCG for gestational age in early pregnancy   3. Abnormal pregnancy in first trimester   4. [redacted] weeks gestation of pregnancy    - Patient is scheduled for a repeat quant on Monday 2/12. Encouraged to keep  appointment,  - Worsening signs and return precautions reviewed.  - Information provided on signs of miscarriage and when to return to MAU.  - Patient discharged home in stable condition and may return to MAU as needed.   Jacquiline Doe, MSN CNM  01/08/2023, 6:05 PM

## 2023-01-08 NOTE — MAU Note (Addendum)
Isabel Conner is a 27 y.o. at 68w1dhere in MAU reporting: has been bleeding for a couple days.  Started as brown, though it was implantation bleeding.  Now is a little red.  Does not require a pad. "Has hx of miscarriages so just wants to be sure everything is ok". Had some mild cramping earlier today.   Onset of complaint: couple days ago Pain score: none Vitals:   01/08/23 1632  BP: (!) 113/59  Pulse: 79  Resp: 18  Temp: 98.6 F (37 C)  SpO2: 100%     Lab orders placed from triage:  blood drawn in triage.

## 2023-02-28 HISTORY — PX: LAPAROSCOPIC GASTRIC SLEEVE RESECTION: SHX5895

## 2023-09-22 ENCOUNTER — Encounter (HOSPITAL_BASED_OUTPATIENT_CLINIC_OR_DEPARTMENT_OTHER): Payer: Self-pay | Admitting: *Deleted

## 2023-09-22 ENCOUNTER — Emergency Department (HOSPITAL_BASED_OUTPATIENT_CLINIC_OR_DEPARTMENT_OTHER)
Admission: EM | Admit: 2023-09-22 | Discharge: 2023-09-22 | Disposition: A | Discharge status: Home / Self Care | Payer: Medicaid Other | Attending: Emergency Medicine | Admitting: Emergency Medicine

## 2023-09-22 ENCOUNTER — Other Ambulatory Visit: Payer: Self-pay

## 2023-09-22 ENCOUNTER — Emergency Department (HOSPITAL_BASED_OUTPATIENT_CLINIC_OR_DEPARTMENT_OTHER): Payer: Medicaid Other | Admitting: Radiology

## 2023-09-22 DIAGNOSIS — Z87442 Personal history of urinary calculi: Secondary | ICD-10-CM | POA: Insufficient documentation

## 2023-09-22 DIAGNOSIS — E119 Type 2 diabetes mellitus without complications: Secondary | ICD-10-CM | POA: Diagnosis not present

## 2023-09-22 DIAGNOSIS — R072 Precordial pain: Secondary | ICD-10-CM | POA: Insufficient documentation

## 2023-09-22 DIAGNOSIS — R0789 Other chest pain: Secondary | ICD-10-CM

## 2023-09-22 DIAGNOSIS — E876 Hypokalemia: Secondary | ICD-10-CM | POA: Diagnosis not present

## 2023-09-22 LAB — BASIC METABOLIC PANEL
Anion gap: 12 (ref 5–15)
BUN: 10 mg/dL (ref 6–20)
CO2: 25 mmol/L (ref 22–32)
Calcium: 9.1 mg/dL (ref 8.9–10.3)
Chloride: 103 mmol/L (ref 98–111)
Creatinine, Ser: 0.55 mg/dL (ref 0.44–1.00)
GFR, Estimated: >60 mL/min (ref >60)
Glucose, Bld: 161 mg/dL — ABNORMAL HIGH (ref 70–99)
Potassium: 3.3 mmol/L — ABNORMAL LOW (ref 3.5–5.1)
Sodium: 140 mmol/L (ref 135–145)

## 2023-09-22 LAB — CBC
HCT: 43 % (ref 36.0–46.0)
Hemoglobin: 14.3 g/dL (ref 12.0–15.0)
MCH: 27.7 pg (ref 26.0–34.0)
MCHC: 33.3 g/dL (ref 30.0–36.0)
MCV: 83.2 fL (ref 80.0–100.0)
Platelets: 205 10*3/uL (ref 150–400)
RBC: 5.17 MIL/uL — ABNORMAL HIGH (ref 3.87–5.11)
RDW: 14.4 % (ref 11.5–15.5)
WBC: 9 10*3/uL (ref 4.0–10.5)
nRBC: 0 % (ref 0.0–0.2)

## 2023-09-22 LAB — TROPONIN I (HIGH SENSITIVITY): Troponin I (High Sensitivity): 2 ng/L (ref <18)

## 2023-09-22 MED ORDER — POTASSIUM CHLORIDE CRYS ER 20 MEQ PO TBCR
40.0000 meq | EXTENDED_RELEASE_TABLET | Freq: Once | ORAL | Status: DC
  Filled 2023-09-22: qty 2

## 2023-09-22 MED ORDER — POTASSIUM CHLORIDE CRYS ER 20 MEQ PO TBCR: 20.0000 meq | EXTENDED_RELEASE_TABLET | Freq: Two times a day (BID) | ORAL | 0 refills | Status: DC

## 2023-09-22 MED ORDER — POTASSIUM CHLORIDE 20 MEQ PO PACK
40.0000 meq | PACK | Freq: Once | ORAL | Status: AC
  Administered 2023-09-22: 40 meq via ORAL
  Filled 2023-09-22: qty 2

## 2023-09-22 MED ORDER — ACETAMINOPHEN 325 MG PO TABS
650.0000 mg | ORAL_TABLET | Freq: Once | ORAL | Status: AC
  Administered 2023-09-22: 650 mg via ORAL
  Filled 2023-09-22: qty 2

## 2023-09-22 NOTE — ED Notes (Signed)
Pt was anxious about blood draw and had near syncope after IV.  Resting in triage room

## 2023-09-22 NOTE — ED Notes (Signed)
Patient transported to X-ray 

## 2023-09-22 NOTE — Discharge Instructions (Signed)
Please follow-up with your primary care provider regards recent ER visit.  Today your labs and imaging were negative for any fractures or extreme abnormalities.  Your potassium is slightly low and so you are given some potassium today and I prescribed a few days potassium for you as well.  You may take Tylenol every 6 hours as needed for pain and use ice and rest over the next few days.  If symptoms change or worsen please return to the ER.

## 2023-09-22 NOTE — ED Notes (Addendum)
Pt transported to imaging.

## 2023-09-22 NOTE — ED Triage Notes (Signed)
Pt is here for right sided chest pain which has been going on for two days but is worse today.  Pt reports pain increases with movement and deep breathing.

## 2023-09-22 NOTE — ED Provider Notes (Signed)
Isabel Conner Provider Note   CSN: [id number] Arrival date & time: 09/22/23  1438     History  Chief Complaint  Patient presents with   Chest Pain    Isabel Conner is a 27 y.o. female history of anal fistula, gestational diabetes, kidney stone, marijuana use, type 2 diabetes presented with chest pain for the past 4 days.  Patient states she was playing with her son her son jumped on her chest and she felt a popping sensation on her sternum.  Patient states that hurts to move or press on her chest.  Patient took 1 dose of Flexeril earlier today but is unsure if it helped.  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  Patient states has been able to breathe without issue and denies any nausea vomiting, cough, trouble breathing, abdominal pain.  Chest pain does not radiate as it stays on the sternum and hurts with movement.  Home Medications Prior to Admission medications   Medication Sig Start Date End Date Taking? Authorizing Provider  albuterol (VENTOLIN HFA) 108 (90 Base) MCG/ACT inhaler Inhale 2 puffs into the lungs every 4 (four) hours as needed for wheezing or shortness of breath. 09/19/23  Yes [provider]  benzonatate (TESSALON) 100 MG capsule Take 100 mg by mouth 3 (three) times daily as needed. 09/19/23  Yes [provider]  fluconazole (DIFLUCAN) 200 MG tablet Take 200 mg by mouth once. Repeat in 3 days 09/19/23 09/24/23 Yes [provider]  PHEXXI 1.8-1-0.4 % GEL Place 1 Application vaginally once. 12/01/21  Yes [provider]  potassium chloride SA (KLOR-CON M) 20 MEQ tablet Take 1 tablet (20 mEq total) by mouth 2 (two) times daily for 3 days. 09/22/23 09/25/23 Yes Kehinde Bowdish, Beverly Gust, PA-C  promethazine-dextromethorphan (PROMETHAZINE-DM) 6.25-15 MG/5ML syrup Take 5 mLs by mouth 4 (four) times daily as needed for cough. 09/19/23 09/29/23 Yes  [provider]  acetaminophen (TYLENOL) 500 MG tablet Take 2 tablets (1,000 mg total) by mouth every 6 (six) hours as needed. 06/07/21   Marylene Land, CNM  Blood Glucose Monitoring Suppl DEVI Use daily to check for blood sugars 250.00 02/26/22   [provider]  ELLA 30 MG tablet Take 1 tablet by mouth once.    [provider]  prenatal vitamin w/FE, FA (PRENATAL 1 + 1) 27-1 MG TABS tablet Take 1 tablet by mouth daily at 12 noon. 01/04/23   Adline Potter, NP      Allergies    Amoxicillin, Penicillins, and Aleve [naproxen sodium]    Review of Systems   Review of Systems  Physical Exam Updated Vital Signs BP (!) 107/59   Pulse (!) 57   Temp 98.7 F (37.1 C) (Oral)   Resp (!) 22   LMP 09/15/2023 (Approximate)   SpO2 97%   Breastfeeding No  Physical Exam Vitals reviewed.  Constitutional:      General: She is not in acute distress. HENT:     Head: Normocephalic and atraumatic.  Eyes:     Extraocular Movements: Extraocular movements intact.     Conjunctiva/sclera: Conjunctivae normal.     Pupils: Pupils are equal, round, and reactive to light.  Cardiovascular:     Rate and Rhythm: Normal rate and regular rhythm.     Pulses: Normal pulses.     Heart sounds: Normal heart sounds.     Comments: 2+ bilateral radial/dorsalis  pedis pulses with regular rate Pulmonary:     Effort: Pulmonary effort is normal. No respiratory distress.     Breath sounds: Normal breath sounds.  Abdominal:     Palpations: Abdomen is soft.     Tenderness: There is no abdominal tenderness. There is no guarding or rebound.  Musculoskeletal:        General: Normal range of motion.     Cervical back: Normal range of motion and neck supple.     Comments: 5 out of 5 bilateral grip/leg extension strength Tenderness palpation of sternum however no bony abnormalities noted  Skin:    General: Skin is warm and dry.     Capillary Refill: Capillary refill takes less than 2  seconds.  Neurological:     General: No focal deficit present.     Mental Status: She is alert and oriented to person, place, and time.     Comments: Sensation intact in all 4 limbs  Psychiatric:        Mood and Affect: Mood normal.     ED Results / Procedures / Treatments   Labs (all labs ordered are listed, but only abnormal results are displayed) Labs Reviewed  BASIC METABOLIC PANEL - Abnormal; Notable for the following components:      Result Value   Potassium 3.3 (*)    Glucose, Bld 161 (*)    All other components within normal limits  CBC - Abnormal; Notable for the following components:   RBC 5.17 (*)    All other components within normal limits  PREGNANCY, URINE  TROPONIN I (HIGH SENSITIVITY)    EKG EKG Interpretation Date/Time:  Thursday September 22 2023 14:45:09 EDT Ventricular Rate:  91 PR Interval:  112 QRS Duration:  94 QT Interval:  378 QTC Calculation: 464 R Axis:   84  Text Interpretation: Normal sinus rhythm Normal ECG When compared with ECG of 16-Jul-2022 13:47, PREVIOUS ECG IS PRESENT when compared to prior,  similar appearance with faster rate. No STEMI Confirmed by Theda Belfast ([id number]) on 09/22/2023 3:17:57 PM  Radiology DG Sternum  Result Date: 09/22/2023 CLINICAL DATA:  Pain after trauma EXAM: STERNUM - 2 VIEW COMPARISON:  None Available. FINDINGS: There is no evidence of fracture or other focal bone lesions. If there is further concern however of a subtle injury, CT scan is recommended for higher sensitivity. IMPRESSION: Grossly no acute osseous abnormality of the sternum Electronically Signed   By: Karen Kays M.D.   On: 09/22/2023 18:13   DG Chest 2 View  Result Date: 09/22/2023 CLINICAL DATA:  Chest pain.  Recent trauma EXAM: CHEST - 2 VIEW COMPARISON:  None Available. FINDINGS: The heart size and mediastinal contours are within normal limits. No consolidation, pneumothorax or effusion. No edema. Normal cardiopericardial silhouette. There  is a small nodular density along the left midthorax which could be a calcified granuloma based on size and density. The visualized skeletal structures are unremarkable. IMPRESSION: No acute cardiopulmonary disease. Electronically Signed   By: Karen Kays M.D.   On: 09/22/2023 18:12    Procedures Procedures    Medications Ordered in ED Medications  acetaminophen (TYLENOL) tablet 650 mg (650 mg Oral Given 09/22/23 1601)  potassium chloride (KLOR-CON) packet 40 mEq (40 mEq Oral Given 09/22/23 1621)    ED Course/ Medical Decision Making/ A&P  Medical Decision Making Amount and/or Complexity of Data Reviewed Labs: ordered. Radiology: ordered.  Risk OTC drugs. Prescription drug management.   Isabel Conner 27 y.o. presented today for chest pain. Working DDx that I considered at this time includes, but not limited to, MSK, ACS, GERD, pulmonary embolism, community-acquired pneumonia, aortic dissection, pneumothorax, underlying bony abnormality, anemia, thyrotoxicosis, esophageal rupture, CHF exacerbation, valvular disorder, myocarditis, pericarditis, endocarditis, pericardial effusion/cardiac tamponade, pulmonary edema, gastritis/PUD, esophagitis.  R/o Dx: ACS, GERD, pulmonary embolism, community-acquired pneumonia, aortic dissection, pneumothorax, underlying bony abnormality, anemia, thyrotoxicosis, esophageal rupture, CHF exacerbation, valvular disorder, myocarditis, pericarditis, endocarditis, pericardial effusion/cardiac tamponade, pulmonary edema, gastritis/PUD, esophagitis: These are considered less likely due to history of present illness and physical exam findings. PE: PERC 0 Aortic Dissection: less likely based on the location, quality, onset, and severity of symptoms in this case. Patient also has a lack of underlying history of AD or TAA.   Review of prior external notes: 09/19/2023 telemedicine  Unique Tests and My Interpretation:  EKG: Rate,  rhythm, axis, intervals all examined and without medically relevant abnormality. ST segments without concerns for elevations Troponin: 2 CXR: No acute findings Sternal x-ray: No acute findings CBC: Unremarkable BMP: Mild hypokalemia 3.3  Social Determinants of Health: none  Discussion with Independent Historian:  Significant other  Discussion of Management of Tests: None  Risk: Medium: prescription drug management  Risk Stratification Score: PERC 0  Plan: On exam patient was in no acute distress with stable vitals. Patient's physical was remarkable for sternal tenderness however the rest of her exam was unremarkable.  There were no bony abnormalities on exam either.  Patient's labs in triage are reassuring however will add on sternal x-ray.  Patient does endorse a popping sensation when this occurred and so suspect she may have sprained her costosternal joint and if x-rays negative will discharge with Tylenol every 6 hours, ice, primary care follow-up. Patient stable at this time.  Patient's first troponin was negative and since has been going on for 4 days we do not need a second troponin as that should have already been elevated.  Patient given potassium due to slightly low potassium.  Patient states that she wants to leave before having the x-ray is read.  Upon my independent or potation I do not see any acute findings however I spoke to the patient about how I would recommend waiting for the radiologist as they may be able to find the more occult findings.  Patient with full decision made capacity and understanding that when she is discharged states she is responsible for the outcomes verbalized that she states she still wants to be discharged and can follow-up on the MyChart app with the imaging.  Encourage patient to use Tylenol every 6 hours and ice and rest over the next few days.  Encourage patient to follow-up with primary care provider as well.  At time of discharge x-rays were  read and do not show any acute findings.  I spoke to the patient we both agree that a CT is not necessary at this time given that has been 4 days and there were no bony abnormalities just tenderness and there is a negative x-ray.  Encourage patient to follow-up with primary care provider and discussed the plan above to which patient verbally agreed and is stable to be discharged.  Patient was given return precautions. Patient stable for discharge at this time.  Patient verbalized understanding of plan.  This chart was dictated using voice recognition software.  Despite best efforts to proofread,  errors can occur which can change the documentation meaning.         Final Clinical Impression(s) / ED Diagnoses Final diagnoses:  Hypokalemia  Sternum pain    Rx / DC Orders ED Discharge Orders          Ordered    potassium chloride SA (KLOR-CON M) 20 MEQ tablet  2 times daily        09/22/23 1558              Remi Deter 09/22/23 1821    Tegeler, Canary Brim, MD 09/22/23 [phone]

## 2023-09-24 ENCOUNTER — Encounter (HOSPITAL_BASED_OUTPATIENT_CLINIC_OR_DEPARTMENT_OTHER): Payer: Self-pay

## 2023-09-24 ENCOUNTER — Emergency Department (HOSPITAL_BASED_OUTPATIENT_CLINIC_OR_DEPARTMENT_OTHER)
Admission: EM | Admit: 2023-09-24 | Discharge: 2023-09-24 | Disposition: A | Discharge status: Home / Self Care | Payer: Medicaid Other | Attending: Emergency Medicine | Admitting: Emergency Medicine

## 2023-09-24 DIAGNOSIS — E876 Hypokalemia: Secondary | ICD-10-CM | POA: Diagnosis not present

## 2023-09-24 DIAGNOSIS — R112 Nausea with vomiting, unspecified: Secondary | ICD-10-CM | POA: Diagnosis present

## 2023-09-24 LAB — CBC WITH DIFFERENTIAL/PLATELET
Abs Immature Granulocytes: 0.04 10*3/uL (ref 0.00–0.07)
Basophils Absolute: 0 10*3/uL (ref 0.0–0.1)
Basophils Relative: 0 %
Eosinophils Absolute: 0 10*3/uL (ref 0.0–0.5)
Eosinophils Relative: 0 %
HCT: 47.4 % — ABNORMAL HIGH (ref 36.0–46.0)
Hemoglobin: 16 g/dL — ABNORMAL HIGH (ref 12.0–15.0)
Immature Granulocytes: 0 %
Lymphocytes Relative: 10 %
Lymphs Abs: 1.4 10*3/uL (ref 0.7–4.0)
MCH: 27.5 pg (ref 26.0–34.0)
MCHC: 33.8 g/dL (ref 30.0–36.0)
MCV: 81.4 fL (ref 80.0–100.0)
Monocytes Absolute: 0.6 10*3/uL (ref 0.1–1.0)
Monocytes Relative: 4 %
Neutro Abs: 12.1 10*3/uL — ABNORMAL HIGH (ref 1.7–7.7)
Neutrophils Relative %: 86 %
Platelets: 211 10*3/uL (ref 150–400)
RBC: 5.82 MIL/uL — ABNORMAL HIGH (ref 3.87–5.11)
RDW: 14.6 % (ref 11.5–15.5)
WBC: 14.2 10*3/uL — ABNORMAL HIGH (ref 4.0–10.5)
nRBC: 0 % (ref 0.0–0.2)

## 2023-09-24 LAB — URINALYSIS, W/ REFLEX TO CULTURE (INFECTION SUSPECTED)
Bacteria, UA: NONE SEEN
Bilirubin Urine: NEGATIVE
Glucose, UA: 100 mg/dL — AB
Hgb urine dipstick: NEGATIVE
Ketones, ur: >80 mg/dL — AB
Leukocytes,Ua: NEGATIVE
Nitrite: NEGATIVE
Protein, ur: 30 mg/dL — AB
Specific Gravity, Urine: 1.026 (ref 1.005–1.030)
pH: 8.5 — ABNORMAL HIGH (ref 5.0–8.0)

## 2023-09-24 LAB — COMPREHENSIVE METABOLIC PANEL
ALT: 7 U/L (ref 0–44)
AST: 15 U/L (ref 15–41)
Albumin: 4.6 g/dL (ref 3.5–5.0)
Alkaline Phosphatase: 56 U/L (ref 38–126)
Anion gap: 14 (ref 5–15)
BUN: <5 mg/dL — ABNORMAL LOW (ref 6–20)
CO2: 22 mmol/L (ref 22–32)
Calcium: 9.9 mg/dL (ref 8.9–10.3)
Chloride: 99 mmol/L (ref 98–111)
Creatinine, Ser: 0.47 mg/dL (ref 0.44–1.00)
GFR, Estimated: >60 mL/min (ref >60)
Glucose, Bld: 131 mg/dL — ABNORMAL HIGH (ref 70–99)
Potassium: 2.9 mmol/L — ABNORMAL LOW (ref 3.5–5.1)
Sodium: 135 mmol/L (ref 135–145)
Total Bilirubin: 0.8 mg/dL (ref 0.3–1.2)
Total Protein: 7.6 g/dL (ref 6.5–8.1)

## 2023-09-24 LAB — PREGNANCY, URINE: Preg Test, Ur: NEGATIVE

## 2023-09-24 LAB — RAPID URINE DRUG SCREEN, HOSP PERFORMED
Amphetamines: NOT DETECTED
Barbiturates: NOT DETECTED
Benzodiazepines: NOT DETECTED
Cocaine: NOT DETECTED
Opiates: NOT DETECTED
Tetrahydrocannabinol: POSITIVE — AB

## 2023-09-24 LAB — CBG MONITORING, ED: Glucose-Capillary: 131 mg/dL — ABNORMAL HIGH (ref 70–99)

## 2023-09-24 LAB — LIPASE, BLOOD: Lipase: <10 U/L — ABNORMAL LOW (ref 11–51)

## 2023-09-24 MED ORDER — ONDANSETRON 4 MG PO TBDP: 4.0000 mg | ORAL_TABLET | Freq: Three times a day (TID) | ORAL | 0 refills | Status: DC | PRN

## 2023-09-24 MED ORDER — SODIUM CHLORIDE 0.9 % IV BOLUS
1000.0000 mL | Freq: Once | INTRAVENOUS | Status: AC
  Administered 2023-09-24: 1000 mL via INTRAVENOUS

## 2023-09-24 MED ORDER — ONDANSETRON HCL 4 MG/2ML IJ SOLN
4.0000 mg | Freq: Once | INTRAMUSCULAR | Status: AC
  Administered 2023-09-24: 4 mg via INTRAVENOUS
  Filled 2023-09-24: qty 2

## 2023-09-24 MED ORDER — POTASSIUM CHLORIDE CRYS ER 20 MEQ PO TBCR: 20.0000 meq | EXTENDED_RELEASE_TABLET | Freq: Two times a day (BID) | ORAL | 0 refills | Status: DC

## 2023-09-24 MED ORDER — POTASSIUM CHLORIDE CRYS ER 20 MEQ PO TBCR
40.0000 meq | EXTENDED_RELEASE_TABLET | Freq: Once | ORAL | Status: AC
  Administered 2023-09-24: 40 meq via ORAL
  Filled 2023-09-24: qty 2

## 2023-09-24 NOTE — ED Notes (Signed)
Report given to the next RN.Marland KitchenMarland Kitchen

## 2023-09-24 NOTE — ED Provider Notes (Signed)
[facility] AT [facility] Provider Note   CSN: [id number] Arrival date & time: 09/24/23  1536     History  Chief Complaint  Patient presents with   Emesis    Isabel Conner is a 27 y.o. female history of kidney stone here for evaluation of nausea and vomiting.  Vomiting started this morning.  Multiple episodes of NBNB emesis.  She denies any abdominal pain.  States when she vomits she occasionally have tingling to her face and her hands.  No weakness, facial droop, difficulty with word finding.  No fever, chest pain, shortness of breath, Donnell pain, diarrhea, dysuria.  Having normal bowel movements.  Did drink EtOH last night as well as marijuana.  Took Zofran which helped.  Has history of gastric bypass.  She denies any NSAID use, steroid use.  She was seen a few days ago and was diagnosed with hypokalemia, supposed be taking potassium supplementation, not taking.  HPI     Home Medications Prior to Admission medications   Medication Sig Start Date End Date Taking? Authorizing Provider  acetaminophen (TYLENOL) 500 MG tablet Take 2 tablets (1,000 mg total) by mouth every 6 (six) hours as needed. 06/07/21   Marylene Land, CNM  albuterol (VENTOLIN HFA) 108 (90 Base) MCG/ACT inhaler Inhale 2 puffs into the lungs every 4 (four) hours as needed for wheezing or shortness of breath. 09/19/23   [provider]  benzonatate (TESSALON) 100 MG capsule Take 100 mg by mouth 3 (three) times daily as needed. 09/19/23   [provider]  Blood Glucose Monitoring Suppl DEVI Use daily to check for blood sugars 250.00 02/26/22   [provider]  ELLA 30 MG tablet Take 1 tablet by mouth once.    [provider]  fluconazole (DIFLUCAN) 200 MG tablet Take 200 mg by mouth once. Repeat in 3 days 09/19/23 09/24/23  [provider]  ondansetron (ZOFRAN-ODT) 4 MG disintegrating tablet Take 1 tablet (4 mg total) by mouth every 8  (eight) hours as needed. 09/24/23  Yes Koraima Albertsen A, PA-C  PHEXXI 1.8-1-0.4 % GEL Place 1 Application vaginally once. 12/01/21   [provider]  potassium chloride SA (KLOR-CON M) 20 MEQ tablet Take 1 tablet (20 mEq total) by mouth 2 (two) times daily for 4 days. 09/24/23 09/28/23 Yes Torryn Fiske A, PA-C  prenatal vitamin w/FE, FA (PRENATAL 1 + 1) 27-1 MG TABS tablet Take 1 tablet by mouth daily at 12 noon. 01/04/23   Adline Potter, NP  promethazine-dextromethorphan (PROMETHAZINE-DM) 6.25-15 MG/5ML syrup Take 5 mLs by mouth 4 (four) times daily as needed for cough. 09/19/23 09/29/23  [provider]      Allergies    Amoxicillin, Penicillins, and Aleve [naproxen sodium]    Review of Systems   Review of Systems  Constitutional: Negative.   HENT: Negative.    Respiratory: Negative.    Cardiovascular: Negative.   Gastrointestinal:  Positive for nausea and vomiting. Negative for abdominal distention, abdominal pain, anal bleeding, blood in stool, constipation, diarrhea and rectal pain.  Genitourinary: Negative.   Musculoskeletal: Negative.   Skin: Negative.   Neurological:        Tingling with emesis  All other systems reviewed and are negative.   Physical Exam Updated Vital Signs BP 129/78   Pulse 63   Temp 98.6 F (37 C)   Resp 20   LMP 09/15/2023 (Approximate)   SpO2 100%  Physical Exam Vitals and nursing note reviewed.  Constitutional:      General: She is not in acute distress.    Appearance: She is well-developed. She is not ill-appearing, toxic-appearing or diaphoretic.  HENT:     Head: Normocephalic and atraumatic.     Nose: Nose normal.     Mouth/Throat:     Mouth: Mucous membranes are moist.  Eyes:     Pupils: Pupils are equal, round, and reactive to light.  Cardiovascular:     Rate and Rhythm: Normal rate.     Pulses: Normal pulses.     Heart sounds: Normal heart sounds.  Pulmonary:     Effort: Pulmonary effort is normal. No  respiratory distress.     Breath sounds: Normal breath sounds.  Abdominal:     General: There is no distension.     Palpations: Abdomen is soft.     Tenderness: There is no abdominal tenderness. There is no right CVA tenderness, left CVA tenderness or guarding.     Comments: Soft, nontender, no rebound or guarding  Musculoskeletal:        General: No swelling, tenderness, deformity or signs of injury. Normal range of motion.     Cervical back: Normal range of motion.     Right lower leg: No edema.     Left lower leg: No edema.  Skin:    General: Skin is warm and dry.     Capillary Refill: Capillary refill takes less than 2 seconds.     Comments: No rashes, lesions  Neurological:     General: No focal deficit present.     Mental Status: She is alert and oriented to person, place, and time.     Cranial Nerves: No cranial nerve deficit.     Sensory: No sensory deficit.     Motor: No weakness.     Coordination: Coordination normal.     Gait: Gait normal.  Psychiatric:        Mood and Affect: Mood normal.    ED Results / Procedures / Treatments   Labs (all labs ordered are listed, but only abnormal results are displayed) Labs Reviewed  CBC WITH DIFFERENTIAL/PLATELET - Abnormal; Notable for the following components:      Result Value   WBC 14.2 (*)    RBC 5.82 (*)    Hemoglobin 16.0 (*)    HCT 47.4 (*)    Neutro Abs 12.1 (*)    All other components within normal limits  COMPREHENSIVE METABOLIC PANEL - Abnormal; Notable for the following components:   Potassium 2.9 (*)    Glucose, Bld 131 (*)    BUN <5 (*)    All other components within normal limits  LIPASE, BLOOD - Abnormal; Notable for the following components:   Lipase <10 (*)    All other components within normal limits  URINALYSIS, W/ REFLEX TO CULTURE (INFECTION SUSPECTED) - Abnormal; Notable for the following components:   pH 8.5 (*)    Glucose, UA 100 (*)    Ketones, ur >80 (*)    Protein, ur 30 (*)    All other  components within normal limits  RAPID URINE DRUG SCREEN, HOSP PERFORMED - Abnormal; Notable for the following components:   Tetrahydrocannabinol POSITIVE (*)    All other components within normal limits  PREGNANCY, URINE  CBG MONITORING, ED    EKG None  Radiology No results found.  Procedures Procedures    Medications Ordered in ED Medications  sodium chloride 0.9 % bolus 1,000 mL (0 mLs Intravenous Stopped  09/24/23 2136)  ondansetron (ZOFRAN) injection 4 mg (4 mg Intravenous Given 09/24/23 2029)  potassium chloride SA (KLOR-CON M) CR tablet 40 mEq (40 mEq Oral Given 09/24/23 2135)    ED Course/ Medical Decision Making/ A&P   27 year old here for evaluation of nausea and vomiting, began earlier today.  She is afebrile, nonseptic, not ill-appearing.  No URI symptoms.  No abdominal pain, change in bowel movements.  No dysuria, hematuria.  She denies chance of pregnancy.  No pelvic pain, vaginal discharge or concern for STDs.  She was noted be hypokalemic on ED visit a few days ago, not taking the potassium supplementation.  Does state that when she has some vomiting she has tingling to her face and her hands.  She has a nonfocal neuroexam without deficits.  Her heart and lungs are clear.  Abdomen soft, tender.  Will plan on labs and reassess  Labs personally viewed and interpreted:  CBC leukocytosis 14.2, hemoglobin 16.0 Metabolic panel shows hypokalemia 2.9 UA negative for infection does show ketones suspect likely due to dehydration, will give IV fluids Lipase less than 10 Pregnancy test negative UDS positive for [facility]  Patient reassessed.  Feels improved after IV fluids, antiemetics.  She was given potassium supplementation.  Patient on reassessment was able to tolerate p.o. intake brought in from friend.  Has benign abdominal exam.  Encourage compliance with potassium supplementation, wrote prescription for Zofran, will have her follow-up outpatient, return for any worsening  symptoms.  Patient is nontoxic, nonseptic appearing, in no apparent distress.  Patient's pain and other symptoms adequately managed in emergency department.  Fluid bolus given.  Labs, imaging and vitals reviewed.  Patient does not meet the SIRS or Sepsis criteria.  On repeat exam patient does not have a surgical abdomin and there are no peritoneal signs.  No indication of appendicitis, bowel obstruction, bowel perforation, cholecystitis, diverticulitis, PID, TOA, torsion or ectopic pregnancy.  Patient discharged home with symptomatic treatment and given strict instructions for follow-up with their primary care physician.  I have also discussed reasons to return immediately to the ER.  Patient expresses understanding and agrees with plan.                                  Medical Decision Making Amount and/or Complexity of Data Reviewed Independent Historian: friend External Data Reviewed: labs, radiology, ECG and notes. Labs: ordered. Decision-making details documented in ED Course. Radiology: ordered and independent interpretation performed. Decision-making details documented in ED Course.  Risk OTC drugs. Prescription drug management. Parenteral controlled substances. Decision regarding hospitalization. Diagnosis or treatment significantly limited by social determinants of health.         Final Clinical Impression(s) / ED Diagnoses Final diagnoses:  Nausea and vomiting, unspecified vomiting type  Hypokalemia    Rx / DC Orders ED Discharge Orders          Ordered    potassium chloride SA (KLOR-CON M) 20 MEQ tablet  2 times daily        09/24/23 2216    ondansetron (ZOFRAN-ODT) 4 MG disintegrating tablet  Every 8 hours PRN        09/24/23 2216              Jasime Westergren A, PA-C 09/24/23 2226    Arby Barrette, MD 09/26/23 1157

## 2023-09-24 NOTE — Discharge Instructions (Addendum)
It was a pleasure taking care of you here in the emergency department  I written you for some additional Zofran to help with your nausea and vomiting, I would take scheduled over the next 2 days to your symptoms under control.  Would recommend increasing liquids such as Pedialyte, Gatorade to make sure you stay hydrated.  Your potassium level was low here which can also cause tingling.  Potassium levels can be low when you have a lot of nausea and vomiting.  Make sure to keep close monitor your symptoms, return for any worsening symptoms.  Start taking the potassium supplementation.

## 2023-09-24 NOTE — ED Triage Notes (Signed)
Patient arrives via EMS from home with complaints of vomiting that started this morning. Per EMS, the patient was hyperventilating on the scene as well. Patient did acknowledge life stressors at home as well.   Reports tingling face and hands. EMS Vital Signs: BP 140/92 HR 76 RR 22 O2 99% on Room air

## 2023-09-24 NOTE — ED Triage Notes (Signed)
She reports several episodes of emesis today. she tells me she has hx of gastric sleeve surgery.

## 2023-12-11 ENCOUNTER — Ambulatory Visit
Admission: RE | Admit: 2023-12-11 | Discharge: 2023-12-11 | Disposition: A | Discharge status: Home / Self Care | Payer: Medicaid Other | Source: Ambulatory Visit | Attending: Family Medicine | Admitting: Family Medicine

## 2023-12-11 ENCOUNTER — Other Ambulatory Visit: Payer: Self-pay

## 2023-12-11 VITALS — BP 114/70 | HR 56 | Temp 98.2°F | Resp 20

## 2023-12-11 DIAGNOSIS — H1033 Unspecified acute conjunctivitis, bilateral: Secondary | ICD-10-CM

## 2023-12-11 DIAGNOSIS — J069 Acute upper respiratory infection, unspecified: Secondary | ICD-10-CM | POA: Diagnosis not present

## 2023-12-11 MED ORDER — PSEUDOEPH-BROMPHEN-DM 30-2-10 MG/5ML PO SYRP: 5.0000 mL | ORAL_SOLUTION | Freq: Four times a day (QID) | ORAL | 0 refills | Status: DC | PRN

## 2023-12-11 MED ORDER — POLYMYXIN B-TRIMETHOPRIM 10000-0.1 UNIT/ML-% OP SOLN: 1.0000 [drp] | Freq: Four times a day (QID) | OPHTHALMIC | 0 refills | Status: DC

## 2023-12-11 NOTE — ED Provider Notes (Signed)
 [facility]    CSN: [id number] Arrival date & time: 12/11/23  1212      History   Chief Complaint Chief Complaint  Patient presents with   Eye Problem    Woke up with pink eye. Been with upper respiratory infection for a few days prior - Entered by patient    HPI Isabel Conner is a 28 y.o. female.   Patient presenting today with several day history of congestion, chills, cough and now woke up this morning with right eye drainage, crusting, itching and irritation that seems to be moving to the left eye throughout the day.  Denies fever, chest pain, shortness of breath, abdominal pain, nausea vomiting or diarrhea.  Took some NyQuil a few days ago, otherwise not tried anything for symptoms.  Son was recently sick with similar symptoms.    Past Medical History:  Diagnosis Date   Anal fistula    Chronic back pain    Gestational diabetes    Kidney stone    Obesity    Ovarian cyst     Patient Active Problem List   Diagnosis Date Noted   Missed period 12/28/2022   Pregnancy examination or test, positive result 12/28/2022   Pelvic pressure in female 12/28/2022   Pelvic pain 12/28/2022   Urinary frequency 12/28/2022   History of ectopic pregnancy 12/28/2022   Type 2 diabetes mellitus (HCC) 12/07/2021   Cesarean delivery delivered 09/26/2021   Chorioamnionitis 09/26/2021   Right ovarian cyst 05/18/2021   Marijuana use 05/18/2021   Obesity    Chronic back pain     Past Surgical History:  Procedure Laterality Date   ABDOMINAL SURGERY     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 Authorizing Provider  brompheniramine-pseudoephedrine-DM 30-2-10 MG/5ML syrup Take 5 mLs by mouth 4 (four) times daily as needed. 12/11/23  Yes Stuart Vernell Norris, PA-C  trimethoprim -polymyxin b  (POLYTRIM ) ophthalmic solution Place 1 drop into both eyes every 6 (six) hours. 12/11/23  Yes Stuart Vernell Norris, PA-C  acetaminophen  (TYLENOL ) 500 MG tablet Take 2 tablets (1,000 mg total) by mouth every 6 (six) hours as needed. 06/07/21   Kooistra, Kathryn Lorraine, CNM  albuterol (VENTOLIN HFA) 108 (90 Base) MCG/ACT inhaler Inhale 2 puffs into the lungs every 4 (four) hours as needed for wheezing or shortness of breath. 09/19/23   [provider]  benzonatate (TESSALON) 100 MG capsule Take 100 mg by mouth 3 (three) times daily as needed. 09/19/23   [provider]  Blood Glucose Monitoring Suppl DEVI Use daily to check for blood sugars 250.00 02/26/22   [provider]  TOY  30 MG tablet Take 1 tablet by mouth once.    [provider]  ondansetron  (ZOFRAN -ODT) 4 MG disintegrating tablet Take 1 tablet (4 mg total) by mouth every 8 (eight) hours as needed. 09/24/23   Henderly, Britni A, PA-C  PHEXXI  1.8-1-0.4 % GEL Place 1 Application vaginally once. 12/01/21   [provider]  potassium chloride  SA (KLOR-CON  M) 20 MEQ tablet Take 1 tablet (20 mEq total) by mouth 2 (two) times daily for 4 days. 09/24/23 09/28/23  Henderly, Britni A, PA-C  prenatal vitamin w/FE, FA (PRENATAL 1 + 1) 27-1 MG TABS tablet Take 1 tablet by mouth daily at 12 noon. 01/04/23   Signa Delon LABOR, NP    Family History Family History  Problem Relation Age of Onset   Diabetes Mother    Hypertension Mother    Pulmonary embolism Mother        caused death   Hypertension Father    Diabetes  Maternal Grandmother    Diabetes Paternal Grandmother     Social History Social History   Tobacco Use   Smoking status: Never   Smokeless tobacco: Never  Vaping Use   Vaping status: Never Used  Substance Use Topics   Alcohol use: Not Currently    Alcohol/week: 3.0 standard drinks of alcohol    Types: 3 Standard drinks or equivalent per week    Comment: occ   Drug use: Not Currently    Types: Marijuana    Comment: quit in December 2021     Allergies   Amoxicillin, Penicillins, and Aleve  [naproxen  sodium]   Review of Systems Review of Systems Per HPI  Physical Exam Triage Vital Signs ED Triage Vitals  Encounter Vitals Group     BP 12/11/23 1259 114/70     Systolic BP Percentile --      Diastolic BP Percentile --      Pulse Rate 12/11/23 1259 (!) 56     Resp 12/11/23 1259 20     Temp 12/11/23 1259 98.2 F (36.8 C)     Temp Source 12/11/23 1259 Oral     SpO2 12/11/23 1259 98 %     Weight --      Height --      Head Circumference --      Peak Flow --      Pain Score 12/11/23 1257 0     Pain Loc --      Pain Education --      Exclude from Growth Chart --    No data found.  Updated Vital Signs BP 114/70 (BP Location: Right Arm)   Pulse (!) 56   Temp 98.2 F (36.8 C) (Oral)   Resp 20   LMP 12/03/2023 (Approximate)   SpO2 98%   Visual Acuity Right Eye Distance:   Left Eye Distance:   Bilateral Distance:    Right Eye Near:   Left Eye Near:    Bilateral Near:     Physical Exam Vitals and nursing note reviewed.  Constitutional:      Appearance: Normal appearance.  HENT:     Head: Atraumatic.     Right Ear: Tympanic membrane and external ear normal.     Left Ear: Tympanic membrane and external ear normal.     Nose: Rhinorrhea present.     Mouth/Throat:     Mouth: Mucous membranes are moist.     Pharynx: Posterior oropharyngeal erythema present.  Eyes:     Extraocular Movements: Extraocular movements intact.  Comments: Bilateral conjunctival  injection, crusting and lash lines  Cardiovascular:     Rate and Rhythm: Normal rate and regular rhythm.     Heart sounds: Normal heart sounds.  Pulmonary:     Effort: Pulmonary effort is normal.     Breath sounds: Normal breath sounds. No wheezing or rales.  Musculoskeletal:        General: Normal range of motion.     Cervical back: Normal range of motion and neck supple.  Skin:    General: Skin is warm and dry.  Neurological:     Mental Status: She is alert and oriented to person, place, and time.  Psychiatric:        Mood and Affect: Mood normal.        Thought Content: Thought content normal.      UC Treatments / Results  Labs (all labs ordered are listed, but only abnormal results are displayed) Labs Reviewed - No data to display  EKG   Radiology No results found.  Procedures Procedures (including critical care time)  Medications Ordered in UC Medications - No data to display  Initial Impression / Assessment and Plan / UC Course  I have reviewed the triage vital signs and the nursing notes.  Pertinent labs & imaging results that were available during my care of the patient were reviewed by me and considered in my medical decision making (see chart for details).     Vitals and exam reassuring, suspect viral respiratory infection with likely viral conjunctivitis though will treat with Polytrim  drops in case bacterial.  Bromfed syrup, supportive over-the-counter medications and home care recommended.  Final Clinical Impressions(s) / UC Diagnoses   Final diagnoses:  Viral URI with cough  Acute conjunctivitis of both eyes, unspecified acute conjunctivitis type   Discharge Instructions   None    ED Prescriptions     Medication Sig Dispense Auth. Provider   trimethoprim -polymyxin b  (POLYTRIM ) ophthalmic solution Place 1 drop into both eyes every 6 (six) hours. 10 mL Stuart Vernell Norris, PA-C   brompheniramine-pseudoephedrine-DM 30-2-10 MG/5ML syrup Take 5  mLs by mouth 4 (four) times daily as needed. 120 mL Stuart Vernell Norris, [place]      PDMP not reviewed this encounter.   Stuart Vernell Norris, [place] 12/11/23 1331

## 2023-12-11 NOTE — ED Triage Notes (Signed)
 Pt reports nasal congestion, chills for last several days. Pt also reports right eye swelling/drainage since this am.

## 2024-01-01 ENCOUNTER — Encounter (HOSPITAL_COMMUNITY): Payer: Self-pay

## 2024-01-01 ENCOUNTER — Ambulatory Visit (HOSPITAL_COMMUNITY)
Admission: EM | Admit: 2024-01-01 | Discharge: 2024-01-01 | Disposition: A | Discharge status: Home / Self Care | Payer: Medicaid Other

## 2024-01-01 DIAGNOSIS — J111 Influenza due to unidentified influenza virus with other respiratory manifestations: Secondary | ICD-10-CM | POA: Diagnosis not present

## 2024-01-01 MED ORDER — IBUPROFEN 400 MG PO TABS: 400.0000 mg | ORAL_TABLET | Freq: Four times a day (QID) | ORAL | 0 refills | Status: DC | PRN

## 2024-01-01 MED ORDER — ONDANSETRON 4 MG PO TBDP: 4.0000 mg | ORAL_TABLET | Freq: Three times a day (TID) | ORAL | 0 refills | Status: DC | PRN

## 2024-01-01 MED ORDER — ACETAMINOPHEN 500 MG PO TABS: 1000.0000 mg | ORAL_TABLET | Freq: Four times a day (QID) | ORAL | 0 refills | Status: DC | PRN

## 2024-01-01 MED ORDER — GUAIFENESIN ER 600 MG PO TB12: 600.0000 mg | ORAL_TABLET | Freq: Two times a day (BID) | ORAL | 0 refills | Status: DC | PRN

## 2024-01-01 NOTE — ED Provider Notes (Signed)
[facility]    CSN: [id number] Arrival date & time: 01/01/24  1647      History   Chief Complaint Chief Complaint  Patient presents with   Cough    HPI Isabel Conner is a 28 y.o. female.   Patient presents with cough, headache, body aches, runny nose, fever, and nausea x 2 days.  Denies abdominal pain, vomiting, diarrhea, shortness of breath, and chest pain.   Cough   Past Medical History:  Diagnosis Date   Anal fistula    Chronic back pain    Gestational diabetes    Kidney stone    Obesity    Ovarian cyst     Patient Active Problem List   Diagnosis Date Noted   Missed period 12/28/2022   Pregnancy examination or test, positive result 12/28/2022   Pelvic pressure in female 12/28/2022   Pelvic pain 12/28/2022   Urinary frequency 12/28/2022   History of ectopic pregnancy 12/28/2022   Type 2 diabetes mellitus (HCC) 12/07/2021   Cesarean delivery delivered 09/26/2021   Chorioamnionitis 09/26/2021   Right ovarian cyst 05/18/2021   Marijuana use 05/18/2021   Obesity    Chronic back pain     Past Surgical History:  Procedure Laterality Date   ABDOMINAL SURGERY     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 Authorizing Provider  guaiFENesin (MUCINEX) 600 MG 12 hr tablet Take 1 tablet (600 mg total) by mouth 2 (two) times daily as needed for cough or to loosen phlegm. 01/01/24  Yes Susann Givens,  Fahed Morten A, NP  ibuprofen (ADVIL) 400 MG tablet Take 1 tablet (400 mg total) by mouth every 6 (six) hours as needed for fever, headache, mild pain (pain score 1-3) or moderate pain (pain score 4-6). 01/01/24  Yes Susann Givens, Theola Cuellar A, NP  norethindrone (MICRONOR) 0.35 MG tablet Take 1 tablet by mouth daily. 12/07/23  Yes [provider]  ondansetron (ZOFRAN-ODT) 4 MG disintegrating tablet Take 1 tablet (4 mg total) by mouth every 8 (eight) hours as needed for nausea or vomiting. 01/01/24  Yes Wynonia Lawman A, NP  Vitamin D, Ergocalciferol, (DRISDOL) 1.25 MG (50000 UNIT) CAPS capsule Take 50,000 Units by mouth. 03/22/23  Yes [provider]  acetaminophen (TYLENOL) 500 MG tablet Take 2 tablets (1,000 mg total) by mouth every 6 (six) hours as needed for mild pain (pain score 1-3), moderate pain (pain score 4-6), fever or headache. 01/01/24   Wynonia Lawman A, NP  Blood Glucose Monitoring Suppl DEVI Use daily to check for blood sugars 250.00 02/26/22   [provider]  potassium chloride SA (KLOR-CON M) 20 MEQ tablet  Take 1 tablet (20 mEq total) by mouth 2 (two) times daily for 4 days. 09/24/23 09/28/23  Henderly, Britni A, PA-C    Family History Family History  Problem Relation Age of Onset   Diabetes Mother    Hypertension Mother    Pulmonary embolism Mother        caused death   Hypertension Father    Diabetes Maternal Grandmother    Diabetes Paternal Grandmother     Social History Social History   Tobacco Use   Smoking status: Never   Smokeless tobacco: Never  Vaping Use   Vaping status: Never Used  Substance Use Topics   Alcohol use: Yes    Alcohol/week: 3.0 standard drinks of alcohol    Types: 3 Standard drinks or equivalent per week    Comment: occ   Drug use: Yes    Types: Marijuana     Allergies   Amoxicillin, Penicillins, and Aleve [naproxen sodium]   Review of Systems Review of Systems  Respiratory:  Positive for cough.    Per HPI  Physical  Exam Triage Vital Signs ED Triage Vitals  Encounter Vitals Group     BP 01/01/24 1748 106/67     Systolic BP Percentile --      Diastolic BP Percentile --      Pulse Rate 01/01/24 1748 71     Resp 01/01/24 1748 16     Temp 01/01/24 1748 98.2 F (36.8 C)     Temp Source 01/01/24 1748 Oral     SpO2 01/01/24 1748 97 %     Weight 01/01/24 1741 153 lb (69.4 kg)     Height 01/01/24 1741 5\' 3"  (1.6 m)     Head Circumference --      Peak Flow --      Pain Score 01/01/24 1742 0     Pain Loc --      Pain Education --      Exclude from Growth Chart --    No data found.  Updated Vital Signs BP 106/67 (BP Location: Right Arm)   Pulse 71   Temp 98.2 F (36.8 C) (Oral)   Resp 16   Ht 5\' 3"  (1.6 m)   Wt 153 lb (69.4 kg)   LMP 12/03/2023 (Approximate)   SpO2 97%   BMI 27.10 kg/m   Visual Acuity Right Eye Distance:   Left Eye Distance:   Bilateral Distance:    Right Eye Near:   Left Eye Near:    Bilateral Near:     Physical Exam Vitals and nursing note reviewed.  Constitutional:      General: She is awake. She is not in acute distress.    Appearance: Normal appearance. She is well-developed and well-groomed. She is not ill-appearing.  HENT:     Right Ear: Tympanic membrane, ear canal and external ear normal.     Left Ear: Tympanic membrane, ear canal and external ear normal.     Nose: Congestion and rhinorrhea present.     Mouth/Throat:     Mouth: Mucous membranes are moist.     Pharynx: Posterior oropharyngeal erythema present. No oropharyngeal exudate.  Cardiovascular:     Rate and Rhythm: Normal rate and regular rhythm.  Pulmonary:     Effort: Pulmonary effort is normal.     Breath sounds: Normal breath sounds.  Abdominal:     General: Abdomen is flat. Bowel sounds are normal.     Palpations: Abdomen is soft.  Tenderness: There is no abdominal tenderness.  Musculoskeletal:        General: Normal range of motion.  Skin:    General: Skin is warm and dry.   Neurological:     Mental Status: She is alert.  Psychiatric:        Behavior: Behavior is cooperative.      UC Treatments / Results  Labs (all labs ordered are listed, but only abnormal results are displayed) Labs Reviewed - No data to display  EKG   Radiology No results found.  Procedures Procedures (including critical care time)  Medications Ordered in UC Medications - No data to display  Initial Impression / Assessment and Plan / UC Course  I have reviewed the triage vital signs and the nursing notes.  Pertinent labs & imaging results that were available during my care of the patient were reviewed by me and considered in my medical decision making (see chart for details).     Patient presented with 2-day history of cough, headache, body aches, runny nose, fever, and nausea.  Denies any other symptoms.  Upon assessment congestion and rhinorrhea present, mild erythema noted to pharynx.  Lungs clear bilaterally auscultation.  Nontender to abdomen upon palpation.  Prescribe Zofran as needed for nausea.  Prescribe Tylenol and ibuprofen to take as needed for pain and fever.  Prescribe Mucinex for cough and congestion.  Discussed return precautions. Final Clinical Impressions(s) / UC Diagnoses   Final diagnoses:  Influenza-like illness     Discharge Instructions      You can take Zofran as needed for nausea and vomiting.  I believe your symptoms are from a viral illness. You can alternate between Tylenol and Ibuprofen as needed for pain and fever. I recommend Mucinex for cough and congestion as needed. Stay hydrated and get plenty of rest. Return here if symptoms persist or worsen.      ED Prescriptions     Medication Sig Dispense Auth. Provider   acetaminophen (TYLENOL) 500 MG tablet Take 2 tablets (1,000 mg total) by mouth every 6 (six) hours as needed for mild pain (pain score 1-3), moderate pain (pain score 4-6), fever or headache. 60 tablet Susann Givens, Carollyn Etcheverry  A, NP   ibuprofen (ADVIL) 400 MG tablet Take 1 tablet (400 mg total) by mouth every 6 (six) hours as needed for fever, headache, mild pain (pain score 1-3) or moderate pain (pain score 4-6). 60 tablet Susann Givens, Tyquez Hollibaugh A, NP   ondansetron (ZOFRAN-ODT) 4 MG disintegrating tablet Take 1 tablet (4 mg total) by mouth every 8 (eight) hours as needed for nausea or vomiting. 10 tablet Wynonia Lawman A, NP   guaiFENesin (MUCINEX) 600 MG 12 hr tablet Take 1 tablet (600 mg total) by mouth 2 (two) times daily as needed for cough or to loosen phlegm. 60 tablet Wynonia Lawman A, NP      PDMP not reviewed this encounter.   Wynonia Lawman A, NP 01/01/24 [phone]

## 2024-01-01 NOTE — ED Triage Notes (Signed)
Patient here today with c/o cough, headache, body aches, runny nose, and fever X 1 day. Patient has been taking Tylenol with relief. Her fiance is also sick.

## 2024-01-01 NOTE — Discharge Instructions (Signed)
You can take Zofran as needed for nausea and vomiting.  I believe your symptoms are from a viral illness. You can alternate between Tylenol and Ibuprofen as needed for pain and fever. I recommend Mucinex for cough and congestion as needed. Stay hydrated and get plenty of rest. Return here if symptoms persist or worsen.

## 2024-01-23 ENCOUNTER — Encounter: Payer: Self-pay | Admitting: Obstetrics & Gynecology

## 2024-01-23 ENCOUNTER — Ambulatory Visit: Payer: Medicaid Other | Admitting: Obstetrics & Gynecology

## 2024-01-23 VITALS — BP 93/58 | HR 53 | Ht 63.0 in | Wt 156.0 lb

## 2024-01-23 DIAGNOSIS — K5901 Slow transit constipation: Secondary | ICD-10-CM | POA: Diagnosis not present

## 2024-01-23 DIAGNOSIS — N938 Other specified abnormal uterine and vaginal bleeding: Secondary | ICD-10-CM | POA: Diagnosis not present

## 2024-01-23 MED ORDER — DESOGESTREL-ETHINYL ESTRADIOL 0.15-30 MG-MCG PO TABS
1.0000 | ORAL_TABLET | Freq: Every day | ORAL | 11 refills | Status: DC
Start: 2024-01-23 — End: 2024-07-16

## 2024-01-23 NOTE — Progress Notes (Signed)
 Chief Complaint  Patient presents with   2 periods this month    Sharp pain RLQ intermittent, blood has "sour"smell      28 y.o. [id number] Patient's last menstrual period was 01/10/2024. The current method of family planning is oral progesterone-only contraceptive.  Outpatient Encounter Medications as of 01/23/2024  Medication Sig   calcium carbonate (TUMS - DOSED IN MG ELEMENTAL CALCIUM) 500 MG chewable tablet Chew by mouth.   cyanocobalamin (VITAMIN B12) 1000 MCG tablet Take 1,000 mcg by mouth daily.   desogestrel-ethinyl estradiol (APRI) 0.15-30 MG-MCG tablet Take 1 tablet by mouth daily.   Multiple Vitamins-Iron (QC DAILY MULTIVITAMINS/IRON) TABS Take 1 tablet by mouth daily.   norethindrone (MICRONOR) 0.35 MG tablet Take 1 tablet by mouth daily.   acetaminophen (TYLENOL) 500 MG tablet Take 2 tablets (1,000 mg total) by mouth every 6 (six) hours as needed for mild pain (pain score 1-3), moderate pain (pain score 4-6), fever or headache. (Patient not taking: Reported on 01/23/2024)   Blood Glucose Monitoring Suppl DEVI Use daily to check for blood sugars 250.00 (Patient not taking: Reported on 01/23/2024)   ibuprofen (ADVIL) 400 MG tablet Take 1 tablet (400 mg total) by mouth every 6 (six) hours as needed for fever, headache, mild pain (pain score 1-3) or moderate pain (pain score 4-6). (Patient not taking: Reported on 01/23/2024)   omeprazole (PRILOSEC) 40 MG capsule Take by mouth. (Patient not taking: Reported on 01/23/2024)   ondansetron (ZOFRAN-ODT) 4 MG disintegrating tablet Take 1 tablet (4 mg total) by mouth every 8 (eight) hours as needed for nausea or vomiting. (Patient not taking: Reported on 01/23/2024)   potassium chloride SA (KLOR-CON M) 20 MEQ tablet Take 1 tablet (20 mEq total) by mouth 2 (two) times daily for 4 days.   Vitamin D, Ergocalciferol, (DRISDOL) 1.25 MG (50000 UNIT) CAPS capsule Take 50,000 Units by mouth. (Patient not taking: Reported on 01/23/2024)    [DISCONTINUED] guaiFENesin (MUCINEX) 600 MG 12 hr tablet Take 1 tablet (600 mg total) by mouth 2 (two) times daily as needed for cough or to loosen phlegm. (Patient not taking: Reported on 01/23/2024)   No facility-administered encounter medications on file as of 01/23/2024.    Subjective Pt with DUB and som RLQ discomfort on POP for 6 weeks Also with slow transit constipation Gastric sleeve last year  Past Medical History:  Diagnosis Date   Anal fistula    Chronic back pain    Gestational diabetes    Kidney stone    Obesity    Ovarian cyst     Past Surgical History:  Procedure Laterality Date   ABDOMINAL SURGERY     gastric sleeve April 2024   ADENOIDECTOMY     CESAREAN SECTION N/A 09/26/2021   Procedure: CESAREAN SECTION;  Surgeon: Adam Phenix, MD;  Location: [facility];  Service: Obstetrics;  Laterality: N/A;   TONSILLECTOMY      OB History     Gravida  5   Para  1   Term  1   Preterm      AB  4   Living  1      SAB  1   IAB  1   Ectopic  2   Multiple  0   Live Births  1        Obstetric Comments  Reports the ectopic pregnancies "passed naturally"; no surgery or methotrexate         Allergies  Allergen  Reactions   Amoxicillin Anaphylaxis   Penicillins Anaphylaxis    Has patient had a PCN reaction causing immediate rash, facial/tongue/throat swelling, SOB or lightheadedness with hypotension: Yes Has patient had a PCN reaction causing severe rash involving mucus membranes or skin necrosis: No Has patient had a PCN reaction that required hospitalization No Has patient had a PCN reaction occurring within the last 10 years: No If all of the above answers are "NO", then may proceed with Cephalosporin use.    Aleve [Naproxen Sodium] Other (See Comments)    Chest pain. Pt states she can take Iburprofen    Social History   Socioeconomic History   Marital status: Single    Spouse name: Not on file   Number of children: Not on file   Years  of education: Not on file   Highest education level: Not on file  Occupational History   Not on file  Tobacco Use   Smoking status: Never   Smokeless tobacco: Never  Vaping Use   Vaping status: Never Used  Substance and Sexual Activity   Alcohol use: Yes    Alcohol/week: 3.0 standard drinks of alcohol    Types: 3 Standard drinks or equivalent per week    Comment: occ   Drug use: Yes    Types: Marijuana   Sexual activity: Yes    Birth control/protection: Pill  Other Topics Concern   Not on file  Social History Narrative   Not on file   Social Drivers of Health   Financial Resource Strain: Low Risk  (03/01/2023)   Received from [facility]   Overall Financial Resource Strain (CARDIA)    Difficulty of Paying Living Expenses: Not hard at all  Recent Concern: Financial Resource Strain - Medium Risk (12/08/2022)   Received from [facility]   Overall Financial Resource Strain (CARDIA)    Difficulty of Paying Living Expenses: Somewhat hard  Food Insecurity: No Food Insecurity (12/08/2022)   Received from [facility], [facility]   Hunger Vital Sign    Worried About Running Out of Food in the Last Year: Never true    Ran Out of Food in the Last Year: Never true  Transportation Needs: No Transportation Needs (12/08/2022)   Received from [facility], [facility]   PRAPARE - Transportation    Lack of Transportation (Medical): No    Lack of Transportation (Non-Medical): No  Physical Activity: Insufficiently Active (11/26/2021)   Exercise Vital Sign    Days of Exercise per Week: 1 day    Minutes of Exercise per Session: 30 min  Stress: No Stress Concern Present (11/26/2021)   [employer] of Occupational Health - Occupational Stress Questionnaire    Feeling of Stress : Not at all  Social Connections: Unknown (03/29/2022)   Received from [facility], [facility]   Social Network    Social Network: Not on file    Family History  Problem Relation Age of Onset    Diabetes Mother    Hypertension Mother    Pulmonary embolism Mother        caused death   Hypertension Father    Diabetes Maternal Grandmother    Diabetes Paternal Grandmother     Medications:       Current Outpatient Medications:    calcium carbonate (TUMS - DOSED IN MG ELEMENTAL CALCIUM) 500 MG chewable tablet, Chew by mouth., Disp: , Rfl:    cyanocobalamin (VITAMIN B12) 1000 MCG tablet, Take 1,000 mcg by mouth daily., Disp: , Rfl:  desogestrel-ethinyl estradiol (APRI) 0.15-30 MG-MCG tablet, Take 1 tablet by mouth daily., Disp: 28 tablet, Rfl: 11   Multiple Vitamins-Iron (QC DAILY MULTIVITAMINS/IRON) TABS, Take 1 tablet by mouth daily., Disp: , Rfl:    norethindrone (MICRONOR) 0.35 MG tablet, Take 1 tablet by mouth daily., Disp: , Rfl:    acetaminophen (TYLENOL) 500 MG tablet, Take 2 tablets (1,000 mg total) by mouth every 6 (six) hours as needed for mild pain (pain score 1-3), moderate pain (pain score 4-6), fever or headache. (Patient not taking: Reported on 01/23/2024), Disp: 60 tablet, Rfl: 0   Blood Glucose Monitoring Suppl DEVI, Use daily to check for blood sugars 250.00 (Patient not taking: Reported on 01/23/2024), Disp: , Rfl:    ibuprofen (ADVIL) 400 MG tablet, Take 1 tablet (400 mg total) by mouth every 6 (six) hours as needed for fever, headache, mild pain (pain score 1-3) or moderate pain (pain score 4-6). (Patient not taking: Reported on 01/23/2024), Disp: 60 tablet, Rfl: 0   omeprazole (PRILOSEC) 40 MG capsule, Take by mouth. (Patient not taking: Reported on 01/23/2024), Disp: , Rfl:    ondansetron (ZOFRAN-ODT) 4 MG disintegrating tablet, Take 1 tablet (4 mg total) by mouth every 8 (eight) hours as needed for nausea or vomiting. (Patient not taking: Reported on 01/23/2024), Disp: 10 tablet, Rfl: 0   potassium chloride SA (KLOR-CON M) 20 MEQ tablet, Take 1 tablet (20 mEq total) by mouth 2 (two) times daily for 4 days., Disp: 8 tablet, Rfl: 0   Vitamin D, Ergocalciferol, (DRISDOL)  1.25 MG (50000 UNIT) CAPS capsule, Take 50,000 Units by mouth. (Patient not taking: Reported on 01/23/2024), Disp: , Rfl:   Objective Blood pressure (!) 93/58, pulse (!) 53, height 5\' 3"  (1.6 m), weight 156 lb (70.8 kg), last menstrual period 01/10/2024.  General WDWN female NAD Vulva:  normal appearing vulva with no masses, tenderness or lesions Vagina:  normal mucosa, no discharge Cervix:  Normal no lesions Uterus:  normal size, contour, position, consistency, mobility, non-tender Adnexa: ovaries:present,  normal adnexa in size, nontender and no masses   Pertinent ROS No burning with urination, frequency or urgency No nausea, vomiting or diarrhea Nor fever chills or other constitutional symptoms   Labs or studies     Impression + Management Plan: Diagnoses this Encounter::   ICD-10-CM   1. DUB (dysfunctional uterine bleeding related to POP)  N93.8    change to 30 mic ee + desogestrel    2. Slow transit constipation: s/p sleeve, needs to take pancreatic enzymes and probiotics, recommendations made  K59.01         Medications prescribed during  this encounter: Meds ordered this encounter  Medications   desogestrel-ethinyl estradiol (APRI) 0.15-30 MG-MCG tablet    Sig: Take 1 tablet by mouth daily.    Dispense:  28 tablet    Refill:  11    Labs or Scans Ordered during this encounter: No orders of the defined types were placed in this encounter.     Follow up No follow-ups on file.

## 2024-03-12 ENCOUNTER — Encounter: Payer: Self-pay | Admitting: Advanced Practice Midwife

## 2024-03-12 ENCOUNTER — Ambulatory Visit: Admitting: Advanced Practice Midwife

## 2024-03-12 VITALS — BP 116/77 | HR 80 | Ht 63.0 in | Wt 154.0 lb

## 2024-03-12 DIAGNOSIS — N921 Excessive and frequent menstruation with irregular cycle: Secondary | ICD-10-CM | POA: Diagnosis not present

## 2024-03-12 LAB — POCT HEMOGLOBIN: Hemoglobin: 11.9 g/dL (ref 11–14.6)

## 2024-03-12 NOTE — Progress Notes (Unsigned)
 [facility] Visit  Patient name: Isabel Conner MRN [id number]  Date of birth: January 05, 1996  CC & HPI:  Isabel Conner is a 28 y.o.  female presenting today for bleeding.Had been on POPs, but bled too much. Changed to 30mcg COC  3 weeks ago, 2 active pills left.  Has had very heavy bleeding off and on, stopped around noon today. Had large ov cyst, still feels something is hard and big in her pelvis, not apprectiated w/clinical exam.   Pertinent History Reviewed:  Medical & Surgical Hx:   Past Medical History:  Diagnosis Date   Anal fistula    Chronic back pain    Gestational diabetes    Kidney stone    Obesity    Ovarian cyst    Past Surgical History:  Procedure Laterality Date   ABDOMINAL SURGERY     gastric sleeve April 2024   ADENOIDECTOMY     CESAREAN SECTION N/A 09/26/2021   Procedure: CESAREAN SECTION;  Surgeon: Tresia Fruit, MD;  Location: [facility];  Service: Obstetrics;  Laterality: N/A;   TONSILLECTOMY     Family History  Problem Relation Age of Onset   Diabetes Mother    Hypertension Mother    Pulmonary embolism Mother        caused death   Hypertension Father    Diabetes Maternal Grandmother    Diabetes Paternal Grandmother     Current Outpatient Medications:    acetaminophen (TYLENOL) 500 MG tablet, Take 2 tablets (1,000 mg total) by mouth every 6 (six) hours as needed for mild pain (pain score 1-3), moderate pain (pain score 4-6), fever or headache., Disp: 60 tablet, Rfl: 0   calcium carbonate (TUMS - DOSED IN MG ELEMENTAL CALCIUM) 500 MG chewable tablet, Chew by mouth., Disp: , Rfl:    cyanocobalamin (VITAMIN B12) 1000 MCG tablet, Take 1,000 mcg by mouth daily., Disp: , Rfl:    desogestrel-ethinyl estradiol (APRI) 0.15-30 MG-MCG tablet, Take 1 tablet by mouth daily., Disp: 28 tablet, Rfl: 11   Multiple Vitamins-Iron (QC DAILY MULTIVITAMINS/IRON) TABS, Take 1 tablet by mouth daily., Disp: , Rfl:    omeprazole (PRILOSEC) 40 MG capsule, Take by mouth.,  Disp: , Rfl:    ondansetron (ZOFRAN-ODT) 4 MG disintegrating tablet, Take 1 tablet (4 mg total) by mouth every 8 (eight) hours as needed for nausea or vomiting., Disp: 10 tablet, Rfl: 0   Vitamin D, Ergocalciferol, (DRISDOL) 1.25 MG (50000 UNIT) CAPS capsule, Take 50,000 Units by mouth., Disp: , Rfl:    Blood Glucose Monitoring Suppl DEVI, Use daily to check for blood sugars 250.00 (Patient not taking: Reported on 03/12/2024), Disp: , Rfl:    ibuprofen (ADVIL) 400 MG tablet, Take 1 tablet (400 mg total) by mouth every 6 (six) hours as needed for fever, headache, mild pain (pain score 1-3) or moderate pain (pain score 4-6). (Patient not taking: Reported on 03/12/2024), Disp: 60 tablet, Rfl: 0   norethindrone (MICRONOR) 0.35 MG tablet, Take 1 tablet by mouth daily. (Patient not taking: Reported on 03/12/2024), Disp: , Rfl:    potassium chloride SA (KLOR-CON M) 20 MEQ tablet, Take 1 tablet (20 mEq total) by mouth 2 (two) times daily for 4 days., Disp: 8 tablet, Rfl: 0 Social History: Reviewed -  reports that she has never smoked. She has never used smokeless tobacco.  Review of Systems:   Constitutional: Negative for fever and chills Eyes: Negative for visual disturbances Respiratory: Negative for shortness of breath, dyspnea  Cardiovascular: Negative for chest pain or palpitations  Gastrointestinal: Negative for vomiting, diarrhea and constipation; no abdominal pain Genitourinary: Negative for dysuria and urgency, vaginal irritation or itching Musculoskeletal: Negative for back pain, joint pain, myalgias  Neurological: Negative for dizziness and headaches    Objective Findings:    Physical Examination: Vitals:   03/12/24 1616  BP: 116/77  Pulse: 80   General appearance - well appearing, and in no distress Mental status - alert, oriented to person, place, and time Chest:  Normal respiratory effort Heart - normal rate and regular rhythm Abdomen:  Soft, nontender Pelvic:  deferred Musculoskeletal:  Normal range of motion without pain Extremities:  No edema    No results found for this or any previous visit (from the past 24 hours).    Assessment & Plan:  A: P:  DUB on COCs Stop pills today, restart new packon Friday' divigil samples given prn heavy BTB over the next 2 packs   Pelvic US  to check on ov cyst/r/o other etiology of DUB  Mirena IUD recommended if stll bleeding by 4th pack     No follow-ups on file.  Majel Scott CNM 03/12/2024 4:38 PM

## 2024-03-12 NOTE — Patient Instructions (Signed)
 Restart new pills on 4/18  If you have extra bleeding with your 4th Pack in June, let me know.

## 2024-03-14 ENCOUNTER — Ambulatory Visit: Admitting: Radiology

## 2024-03-14 DIAGNOSIS — N921 Excessive and frequent menstruation with irregular cycle: Secondary | ICD-10-CM | POA: Diagnosis not present

## 2024-03-14 NOTE — Progress Notes (Addendum)
 GYN US : TA and TV imaging performed - vinyl probe cover used - Chaperone: Keila Anteverted uterus normal in size, symmetrical myometrium, no focal abn seen Endom thickness = 3.4 mm, uniform avascular cavity and canal, no evidence of intracavitary defects Nl Left ov does not show mobility.  -  neg left adnexal region The right ovary is seen with 8.2 x 3.9 x 6.8 cm septated cystic mass that appears to arise from the superior lateral aspect of the Right ovary.  This cyst is avascular with homogeneous low level specular reflections seen slowly moving internally with smoothly marginated walls.  The septation appears thickened. This cystic mass does not appear to move freely from the right ovary.  neg Rt adnexal region This Rt ovarian cyst measured 8.1 x 7.5 x 9.5 on ultrasound done 08-28-2018 Right Adnexal cyst on CAT scan 07-10-2022 measured 6.8 cm Appears to be little change compared to previous exams ? Ovarian cystadenoma vs sepated endometrioma  neg CDS, no free fluid present

## 2024-03-15 ENCOUNTER — Encounter: Payer: Self-pay | Admitting: Advanced Practice Midwife

## 2024-03-19 ENCOUNTER — Encounter: Payer: Self-pay | Admitting: Women's Health

## 2024-03-22 ENCOUNTER — Encounter: Payer: Self-pay | Admitting: Women's Health

## 2024-06-18 ENCOUNTER — Telehealth: Admitting: Physician Assistant

## 2024-06-18 DIAGNOSIS — N83201 Unspecified ovarian cyst, right side: Secondary | ICD-10-CM | POA: Diagnosis not present

## 2024-06-18 DIAGNOSIS — M545 Low back pain, unspecified: Secondary | ICD-10-CM

## 2024-06-18 MED ORDER — IBUPROFEN 600 MG PO TABS: 600.0000 mg | ORAL_TABLET | Freq: Three times a day (TID) | ORAL | 0 refills | Status: DC | PRN

## 2024-06-18 MED ORDER — BACLOFEN 10 MG PO TABS: 10.0000 mg | ORAL_TABLET | Freq: Three times a day (TID) | ORAL | 0 refills | Status: DC

## 2024-06-18 NOTE — Progress Notes (Signed)
 E-Visit for Back Pain   We are sorry that you are not feeling well.  Here is how we plan to help!  Based on what you have shared with me it looks like you mostly have acute back pain.  Acute back pain is defined as musculoskeletal pain that can resolve in 1-3 weeks with conservative treatment.  I have prescribed Ibuprofen  600mg  Take 1 tablet every 8 hours as needed, non-steroid anti-inflammatory (NSAID), as well as Baclofen  10 mg every eight hours as needed which is a muscle relaxer  Some patients experience stomach irritation or in increased heartburn with anti-inflammatory drugs.  Please keep in mind that muscle relaxer's can cause fatigue and should not be taken while at work or driving.  Back pain is very common.  The pain often gets better over time.  The cause of back pain is usually not dangerous.  Most people can learn to manage their back pain on their own.  Home Care Stay active.  Start with short walks on flat ground if you can.  Try to walk farther each day. Do not sit, drive or stand in one place for more than 30 minutes.  Do not stay in bed. Do not avoid exercise or work.  Activity can help your back heal faster. Be careful when you bend or lift an object.  Bend at your knees, keep the object close to you, and do not twist. Sleep on a firm mattress.  Lie on your side, and bend your knees.  If you lie on your back, put a pillow under your knees. Only take medicines as told by your doctor. Put ice on the injured area. Put ice in a plastic bag Place a towel between your skin and the bag Leave the ice on for 15-20 minutes, 3-4 times a day for the first 2-3 days. 210 After that, you can switch between ice and heat packs. Ask your doctor about back exercises or massage. Avoid feeling anxious or stressed.  Find good ways to deal with stress, such as exercise.  Get Help Right Way If: Your pain does not go away with rest or medicine. Your pain does not go away in 1 week. You have new  problems. You do not feel well. The pain spreads into your legs. You cannot control when you poop (bowel movement) or pee (urinate) You feel sick to your stomach (nauseous) or throw up (vomit) You have belly (abdominal) pain. You feel like you may pass out (faint). If you develop a fever.  Make Sure you: Understand these instructions. Will watch your condition Will get help right away if you are not doing well or get worse.  Your e-visit answers were reviewed by a board certified advanced clinical practitioner to complete your personal care plan.  Depending on the condition, your plan could have included both over the counter or prescription medications.  If there is a problem please reply  once you have received a response from your provider.  Your safety is important to us .  If you have drug allergies check your prescription carefully.    You can use MyChart to ask questions about today's visit, request a non-urgent call back, or ask for a work or school excuse for 24 hours related to this e-Visit. If it has been greater than 24 hours you will need to follow up with your provider, or enter a new e-Visit to address those concerns.  You will get an e-mail in the next two days asking about  your experience.  I hope that your e-visit has been valuable and will speed your recovery. Thank you for using e-visits.     I have spent 5 minutes in review of e-visit questionnaire, review and updating patient chart, medical decision making and response to patient.   Delon CHRISTELLA Dickinson, PA-C

## 2024-07-05 NOTE — Patient Instructions (Addendum)
 Please follow up in 1 year with us  here at [facility]. There will be an annual visit around the date of your operation which needs to occur for the rest of your life. This is to help keep your weight loss and health improvements on track and try to keep you from developing any nutritional problems. We are happy to see you back up here anytime if you develop any concerns. We look forward to helping you stay on track long term.  Protect your new stomach at all costs. Unless we talked about doing differently, stay away from: Tobacco smoke. Yes, this includes vaping as this is dangerous for your stomach AND lungs Second hand smoke NSAIDs like ibuprofen , etc. Tylenol  tends to be the safest choice. Any alcohol at all.SABRASABRAIf these abnormal clinical findings persist, appropriate workup will be completed. The patient understands that follow up is required to elucidate the situation. If any of the above is an issue or concern, WE NEED TO TALK ABOUT IT IMMEDIATELY. [facility] patients need to let us  know if you develop consistent reflux symptoms or difficulty swallowing at any time Don't hesitate to call with any questions, or send a My Chart message.  Supplements you should be taking: A) MVI 1x -2x daily  (If you've had a bypass 2x daily, if you have had a sleeve gastrectomy 1x daily)  B) Fiber 2x daily, if you have any issues with constipation. We recommend Citrucel powder, tablets, or capsules. - but you can also use Metamucil, FiberCon, Benefiber, etc. Generic versions are a lot cheaper and contain the same active ingredients. C) Vitamin D 2,000IU x2 daily or 50,000u weekly at least. D) You should not need to be taking Actigall at this point; if you are losing more than 10lbs in a month, please discuss with your bariatric surgical provider. D) Prilosec (omeprazole) or a similar medication, once daily was initially prescribed. You may have stopped this at 3-6 months after your operation, if you were not having any  reflux/ heart burn symptoms.  Other symptoms of reflux would be food feeling like it is getting stuck and any of these problems would suggest that you need to be back on Prilosec each day. Please let your surgeon know if you are having any of these symptoms, as we may need to increase or start back up the Prilosec, etc. For SG patients, we need to know if you develop any symptoms of heartburn ever.  E) Calcium  Citrate plus D 600mg  twice daily.    You should be drinking approx 48oz of fluid each day.  Diet:  Protein first.  You should get 80-100gm of protein daily, based on your plan set up with nutritionist.  Strive to consume non-starchy vegetables.  Avoid foods that have no nutritional value and are high in carbohydrates such as breads/rice/crackers, etc. Salads are a great trap for folks after bariatric surgery. Salads will slow down your weight loss and likely cause cramps, gassiness, and/or diarrhea.  Exercise:  You should be doing some type of resistance training 3 times weekly at least (including kettle bells/bands/free weights). In addition, you should be doing a cardiovascular workout such as walking/running/ellipitcal for 30 min at least 2-3 times weekly.  Remember that building muscle burns fat. Don't forget the core muscles of our torso. Core exercises long term are real important and examples can be gotten from our exercise team members or on YouTube. Pilates or yoga are core type of exercises. This is critically important for maintaining long term  the positive changes that you have experienced.  Attend monthly support group! They are virtual now via Zoom. Long term the interactions with our staff and other patients really helps out in meeting and maintaining your goals. Schedule and location for the meetings can be gotten from our staff or online. Un-moderated online support groups are very controversial.   Call our office with any questions or concerns!  [phone]  [email or web address]

## 2024-07-16 ENCOUNTER — Other Ambulatory Visit (HOSPITAL_COMMUNITY)
Admission: RE | Admit: 2024-07-16 | Discharge: 2024-07-16 | Disposition: A | Discharge status: Home / Self Care | Source: Ambulatory Visit | Attending: Obstetrics & Gynecology | Admitting: Obstetrics & Gynecology

## 2024-07-16 ENCOUNTER — Encounter: Payer: Self-pay | Admitting: Obstetrics & Gynecology

## 2024-07-16 ENCOUNTER — Ambulatory Visit (INDEPENDENT_AMBULATORY_CARE_PROVIDER_SITE_OTHER): Admitting: Obstetrics & Gynecology

## 2024-07-16 VITALS — BP 113/70 | HR 66 | Ht 63.0 in

## 2024-07-16 DIAGNOSIS — Z113 Encounter for screening for infections with a predominantly sexual mode of transmission: Secondary | ICD-10-CM

## 2024-07-16 DIAGNOSIS — N83201 Unspecified ovarian cyst, right side: Secondary | ICD-10-CM

## 2024-07-16 DIAGNOSIS — Z01818 Encounter for other preprocedural examination: Secondary | ICD-10-CM | POA: Diagnosis not present

## 2024-07-16 DIAGNOSIS — Z124 Encounter for screening for malignant neoplasm of cervix: Secondary | ICD-10-CM | POA: Insufficient documentation

## 2024-07-16 DIAGNOSIS — N939 Abnormal uterine and vaginal bleeding, unspecified: Secondary | ICD-10-CM | POA: Diagnosis not present

## 2024-07-16 DIAGNOSIS — N83209 Unspecified ovarian cyst, unspecified side: Secondary | ICD-10-CM

## 2024-07-16 DIAGNOSIS — Z3041 Encounter for surveillance of contraceptive pills: Secondary | ICD-10-CM

## 2024-07-16 MED ORDER — NORGESTIMATE-ETH ESTRADIOL 0.25-35 MG-MCG PO TABS: 1.0000 | ORAL_TABLET | Freq: Every day | ORAL | 4 refills | Status: DC

## 2024-07-16 NOTE — Progress Notes (Signed)
 GYN VISIT Patient name: Isabel Conner MRN [id number]  Date of birth: 1995/12/13 Chief Complaint:   surgical consultation (8cm cyst)  History of Present Illness:   Isabel Conner is a 28 y.o. [phone] female being seen today for the following concerns:  -Ovarian cyst: This has been an ongoing issue since her prior pregnancy.  The cyst has continued to slowly increase in size.  The last ultrasound was completed in April 2025 where the cyst measured approximately 8 to 9 cm in size.  Initially she was fine with just conservative management; however, she has noted worsening pain over the past few months.    AUB: She feels like her menses have become more irregular.  She has been transitioned to 2 different pills over the past several months.  Bleeding seems to be about every 2 weeks.  Using about 3-4 tampons per day typically, sometimes may be need to change a pad every 2 hours.  No change/improvement with current pill.    Notes pelvic pain with movement/activity and dyspareunia.  At times, will take tylenol  when she needs it.    Patient's last menstrual period was 07/08/2024.    Review of Systems:   Pertinent items are noted in HPI Denies fever/chills, dizziness, headaches, visual disturbances, fatigue, shortness of breath, chest pain, abdominal pain, vomiting.  Pertinent History Reviewed:   Past Surgical History:  Procedure Laterality Date   ABDOMINAL SURGERY     gastric sleeve April 2024   ADENOIDECTOMY     CESAREAN SECTION N/A 09/26/2021   Procedure: CESAREAN SECTION;  Surgeon: Eveline Lynwood MATSU, MD;  Location: [facility];  Service: Obstetrics;  Laterality: N/A;   TONSILLECTOMY      Past Medical History:  Diagnosis Date   Anal fistula    Chronic back pain    Gestational diabetes    Kidney stone    Obesity    Ovarian cyst    Reviewed problem list, medications and allergies. Physical Assessment:   Vitals:   07/16/24 1032  BP: 113/70  Pulse: 66  Height: 5' 3 (1.6 m)  Body  mass index is 27.28 kg/m.       Physical Examination:   General appearance: alert, well appearing, and in no distress  Psych: mood appropriate, normal affect  Skin: warm & dry   Cardiovascular: normal heart rate noted  Respiratory: normal respiratory effort, no distress  Abdomen: soft, mild RLQ tenderness, no rebound, no guarding   Pelvic: VULVA: normal appearing vulva with no masses, tenderness or lesions, VAGINA: normal appearing vagina with normal color and discharge, no lesions, CERVIX: normal appearing cervix without discharge or lesions, UTERUS: uterus is normal size, shape, consistency and nontender  Extremities: no edema   Chaperone: Dr. Rea Hindel    Assessment & Plan:  1) Ovarian cyst, pelvic pain, AUB -discussed surgical intervention with robotic assisted laparoscopic ovarian cystectomy.  Discussed risk benefit including but not limited to risk of bleeding, infection, injury to surrounding organs requiring further surgical intervention.  Discussed potential for oophorectomy.  Also discussed potential for other complications that cannot be predicted or prevented including DVT, PE or death.  Also discussed that removal of cyst may or may not improve bleeding pattern - Discussed same-day surgery reviewed recovery including pelvic rest and limited activity for 2 to 4 weeks postop - Questions and concerns were addressed, patient does desire to proceed - Plan to schedule for September 10  -will also transition to different pill during interim  2) preventive  screening -pap collected, further management pending results  Meds ordered this encounter  Medications   norgestimate -ethinyl estradiol  (SPRINTEC 28) 0.25-35 MG-MCG tablet    Sig: Take 1 tablet by mouth daily.    Dispense:  90 tablet    Refill:  4     Return for TBD- surgery scheduling.   Corayma Cashatt, DO Attending Obstetrician & Gynecologist, [facility] for [employer], [facility]

## 2024-07-17 LAB — CYTOLOGY - PAP
Chlamydia: NEGATIVE
Comment: NEGATIVE
Comment: NEGATIVE
Comment: NEGATIVE
Comment: NORMAL
Diagnosis: NEGATIVE
High risk HPV: NEGATIVE
Neisseria Gonorrhea: NEGATIVE
Trichomonas: NEGATIVE

## 2024-07-18 ENCOUNTER — Ambulatory Visit
Admission: EM | Admit: 2024-07-18 | Discharge: 2024-07-18 | Disposition: A | Discharge status: Home / Self Care | Attending: Nurse Practitioner | Admitting: Nurse Practitioner

## 2024-07-18 ENCOUNTER — Ambulatory Visit: Payer: Self-pay | Admitting: Obstetrics & Gynecology

## 2024-07-18 DIAGNOSIS — S9781XA Crushing injury of right foot, initial encounter: Secondary | ICD-10-CM | POA: Diagnosis not present

## 2024-07-18 DIAGNOSIS — M79671 Pain in right foot: Secondary | ICD-10-CM

## 2024-07-18 MED ORDER — ACETAMINOPHEN ER 650 MG PO TBCR: 650.0000 mg | EXTENDED_RELEASE_TABLET | Freq: Three times a day (TID) | ORAL | 0 refills | Status: AC | PRN

## 2024-07-18 NOTE — ED Provider Notes (Signed)
 [facility]    CSN: [id number] Arrival date & time: 07/18/24  1640      History   Chief Complaint No chief complaint on file.   HPI Isabel Conner is a 28 y.o. female.   The history is provided by the patient.   Patient presents for complaints of right foot pain that started over the past day.  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  Patient states she was rolling the housekeeping cart down an incline, and accidentally rolled the cart over the top of the right foot.  She now has pain on top of her right foot under the great toe.  She states that she also has mild bruising, and pain with walking.  Patient denies numbness, tingling, radiation of pain, or inability to bear weight.  So far, she has been taking Tylenol  for her symptoms with minimal relief.  Past Medical History:  Diagnosis Date   Anal fistula    Chronic back pain    Gestational diabetes    Kidney stone    Obesity    Ovarian cyst     Patient Active Problem List   Diagnosis Date Noted   Pelvic pressure in female 12/28/2022   Pelvic pain 12/28/2022   Urinary frequency 12/28/2022   History of ectopic pregnancy 12/28/2022   Type 2 diabetes mellitus (HCC) 12/07/2021   Cesarean delivery delivered 09/26/2021   Right ovarian cyst 05/18/2021   Marijuana use 05/18/2021   Obesity    Chronic back pain     Past Surgical History:  Procedure Laterality Date   ABDOMINAL SURGERY     gastric sleeve April 2024   ADENOIDECTOMY     CESAREAN SECTION N/A 09/26/2021   Procedure: CESAREAN SECTION;  Surgeon: Eveline Lynwood MATSU, MD;  Location: [facility];  Service: Obstetrics;  Laterality: N/A;   TONSILLECTOMY      OB History     Gravida  5   Para  1   Term  1   Preterm      AB  4   Living  1      SAB  1   IAB  1   Ectopic  2   Multiple  0   Live Births  1        Obstetric Comments  Reports the ectopic pregnancies passed naturally; no surgery or  methotrexate          Home Medications    Prior to Admission medications   Medication Sig Start Date End Date Taking? Authorizing Provider  acetaminophen  (TYLENOL ) 500 MG tablet Take 2 tablets (1,000 mg total) by mouth every 6 (six) hours as needed for mild pain (pain score 1-3), moderate pain (pain score 4-6), fever or headache. 01/01/24   Johnie Flaming A, NP  baclofen  (LIORESAL ) 10 MG tablet Take 1 tablet (10 mg total) by mouth 3 (three) times daily. Patient not taking: Reported on 07/16/2024 06/18/24   Vivienne Delon HERO, PA-C  calcium  carbonate (OSCAL) 1500 (600 Ca) MG TABS tablet Take by mouth 2 (two) times daily with a meal.    [provider]  calcium  carbonate (TUMS - DOSED IN MG ELEMENTAL CALCIUM ) 500 MG chewable tablet Chew by mouth. Patient not taking: Reported on 07/16/2024    [provider]  cyanocobalamin (VITAMIN B12) 1000 MCG tablet Take 1,000 mcg by mouth daily. 03/22/23   [provider]  ferrous sulfate 324 MG TBEC Take  324 mg by mouth.    [provider]  ibuprofen  (ADVIL ) 600 MG tablet Take 1 tablet (600 mg total) by mouth every 8 (eight) hours as needed. Patient not taking: Reported on 07/16/2024 06/18/24   Burnette, Jennifer M, PA-C  magnesium oxide (MAG-OX) 400 (240 Mg) MG tablet Take 600 mg by mouth 2 (two) times daily.    [provider]  Multiple Vitamins-Iron (QC DAILY MULTIVITAMINS/IRON) TABS Take 1 tablet by mouth daily. 03/22/23   [provider]  norgestimate -ethinyl estradiol  (SPRINTEC 28) 0.25-35 MG-MCG tablet Take 1 tablet by mouth daily. 07/16/24 10/14/24  Ozan, Jennifer, DO  omeprazole (PRILOSEC) 40 MG capsule Take by mouth. Patient not taking: Reported on 07/16/2024 03/02/23   [provider]  Vitamin D, Ergocalciferol, (DRISDOL) 1.25 MG (50000 UNIT) CAPS capsule Take 50,000 Units by mouth. 03/22/23   [provider]    Family History Family History  Problem Relation Age of Onset    Diabetes Mother    Hypertension Mother    Pulmonary embolism Mother        caused death   Hypertension Father    Diabetes Maternal Grandmother    Diabetes Paternal Grandmother     Social History Social History   Tobacco Use   Smoking status: Never   Smokeless tobacco: Never  Vaping Use   Vaping status: Never Used  Substance Use Topics   Alcohol use: Not Currently    Alcohol/week: 3.0 standard drinks of alcohol    Types: 3 Standard drinks or equivalent per week    Comment: occ   Drug use: Yes    Types: Marijuana    Comment: occ     Allergies   Amoxicillin, Penicillins, and Aleve  [naproxen  sodium]   Review of Systems Review of Systems Per HPI  Physical Exam Triage Vital Signs ED Triage Vitals  Encounter Vitals Group     BP 07/18/24 1651 105/66     Girls Systolic BP Percentile --      Girls Diastolic BP Percentile --      Boys Systolic BP Percentile --      Boys Diastolic BP Percentile --      Pulse Rate 07/18/24 1651 70     Resp 07/18/24 1651 15     Temp 07/18/24 1651 98.7 F (37.1 C)     Temp Source 07/18/24 1651 Oral     SpO2 07/18/24 1651 98 %     Weight --      Height --      Head Circumference --      Peak Flow --      Pain Score 07/18/24 1657 0     Pain Loc --      Pain Education --      Exclude from Growth Chart --    No data found.  Updated Vital Signs BP 105/66 (BP Location: Right Arm)   Pulse 70   Temp 98.7 F (37.1 C) (Oral)   Resp 15   LMP 07/08/2024   SpO2 98%   Visual Acuity Right Eye Distance:   Left Eye Distance:   Bilateral Distance:    Right Eye Near:   Left Eye Near:    Bilateral Near:     Physical Exam Vitals and nursing note reviewed.  Constitutional:      General: She is not in acute distress.    Appearance: Normal appearance.  HENT:     Head: Normocephalic.  Eyes:     Extraocular Movements: Extraocular movements intact.  Pupils: Pupils are equal, round, and reactive to light.  Pulmonary:     Effort:  Pulmonary effort is normal.  Musculoskeletal:     Cervical back: Normal range of motion.     Right foot: Normal range of motion and normal capillary refill. No swelling or deformity. Normal pulse.     Comments: Mild bruising noted to the first metatarsal of the right foot.  There is no obvious swelling, deformity, or erythema present.  Full range of motion, cap refill < 2 sec, neurovascularly intact.  Skin:    General: Skin is warm and dry.  Neurological:     General: No focal deficit present.     Mental Status: She is alert and oriented to person, place, and time.  Psychiatric:        Mood and Affect: Mood normal.        Behavior: Behavior normal.      UC Treatments / Results  Labs (all labs ordered are listed, but only abnormal results are displayed) Labs Reviewed - No data to display  EKG   Radiology No results found.  Procedures Procedures (including critical care time)  Medications Ordered in UC Medications - No data to display  Initial Impression / Assessment and Plan / UC Course  I have reviewed the triage vital signs and the nursing notes.  Pertinent labs & imaging results that were available during my care of the patient were reviewed by me and considered in my medical decision making (see chart for details).  X-ray of the right foot is pending.  On exam, there is no obvious deformity, ecchymosis, or erythema present.  Patient does have very mild bruising, but does have full range of motion of the toes of the right foot.  Postop shoe and Ace wrap provided for additional compression and support.  Supportive care recommendations were provided and discussed with the patient to include RICE therapy.  Work note was provided.   Final Clinical Impressions(s) / UC Diagnoses   Final diagnoses:  None   Discharge Instructions   None    ED Prescriptions   None    PDMP not reviewed this encounter.   Gilmer Etta PARAS, NP 07/18/24 1712

## 2024-07-18 NOTE — Discharge Instructions (Signed)
 Please go to the emergency department at [facility] for an x-ray of the right foot.  Please let them know you are there for an x-ray only. A postop shoe and Ace wrap have been provided to allow for additional compression and support.  Wear the postop shoe and Ace wrap when you are engaged in prolonged or strenuous activity. RICE therapy, rest, ice, compression, and elevation.  Apply ice for 20 minutes, remove for 1 hour, repeat as needed while symptoms persist. As discussed, if your x-ray is negative and symptoms continue to persist after 3 to 4 weeks, recommend follow-up with orthopedics. Follow-up as needed.

## 2024-07-18 NOTE — ED Triage Notes (Signed)
 Pt reports foot injury while at work where she hit her foot on piece of equipment pain is across the top of the right foot, has tried tylenol  but has found no relief. pt has declined filing workers comp at this time and has been informed that if she attempts to file after today workers comp may not cover her claim pt has verbalized understanding.

## 2024-08-01 ENCOUNTER — Encounter: Payer: Self-pay | Admitting: Adult Health

## 2024-08-01 ENCOUNTER — Ambulatory Visit

## 2024-08-01 ENCOUNTER — Other Ambulatory Visit (HOSPITAL_COMMUNITY)
Admission: RE | Admit: 2024-08-01 | Discharge: 2024-08-01 | Disposition: A | Discharge status: Home / Self Care | Source: Ambulatory Visit | Attending: Adult Health | Admitting: Adult Health

## 2024-08-01 ENCOUNTER — Ambulatory Visit (INDEPENDENT_AMBULATORY_CARE_PROVIDER_SITE_OTHER): Admitting: Adult Health

## 2024-08-01 VITALS — BP 102/65 | HR 69 | Ht 63.0 in | Wt 162.5 lb

## 2024-08-01 DIAGNOSIS — B9689 Other specified bacterial agents as the cause of diseases classified elsewhere: Secondary | ICD-10-CM | POA: Diagnosis not present

## 2024-08-01 DIAGNOSIS — N83201 Unspecified ovarian cyst, right side: Secondary | ICD-10-CM

## 2024-08-01 DIAGNOSIS — N898 Other specified noninflammatory disorders of vagina: Secondary | ICD-10-CM | POA: Diagnosis present

## 2024-08-01 DIAGNOSIS — Z113 Encounter for screening for infections with a predominantly sexual mode of transmission: Secondary | ICD-10-CM | POA: Insufficient documentation

## 2024-08-01 DIAGNOSIS — N760 Acute vaginitis: Secondary | ICD-10-CM | POA: Diagnosis not present

## 2024-08-01 DIAGNOSIS — R102 Pelvic and perineal pain unspecified side: Secondary | ICD-10-CM | POA: Insufficient documentation

## 2024-08-01 LAB — POCT WET PREP (WET MOUNT)
Clue Cells Wet Prep Whiff POC: POSITIVE
WBC, Wet Prep HPF POC: POSITIVE

## 2024-08-01 MED ORDER — METRONIDAZOLE 0.75 % VA GEL
1.0000 | Freq: Every day | VAGINAL | 0 refills | Status: AC
Start: 2024-08-01 — End: ?

## 2024-08-01 NOTE — Progress Notes (Signed)
 Subjective:     Patient ID: Isabel Conner, female   DOB: Nov 23, 1996, 28 y.o.   MRN: [id number]  HPI [name] is a 28 year old female female, single, [id number] in complaining of cramping right side, has known cyst for removal 08/08/24. Has had yeast in navel and it is better.     Component Value Date/Time   DIAGPAP  07/16/2024 1042    - Negative for intraepithelial lesion or malignancy (NILM)   DIAGPAP  05/18/2021 1447    - Negative for intraepithelial lesion or malignancy (NILM)   HPVHIGH Negative 07/16/2024 1042   HPVHIGH Negative 05/18/2021 1447   ADEQPAP  07/16/2024 1042    Satisfactory for evaluation; transformation zone component PRESENT.   ADEQPAP Satisfactory for evaluation.  The absence of an 05/18/2021 1447   ADEQPAP  05/18/2021 1447    endocervical/transformation zone component is not uncommon in pregnant   ADEQPAP patients. 05/18/2021 1447    PCP is [place] [facility] Review of Systems + cramping right side, has known cyst for removal 08/08/24.  Has had yeast in navel and it is better.    Has had sex with new partner Reviewed past medical,surgical, social and family history. Reviewed medications and allergies.  Objective:   Physical Exam BP 102/65 (BP Location: Right Arm, Patient Position: Sitting, Cuff Size: Normal)   Pulse 69   Ht 5' 3 (1.6 m)   Wt 162 lb 8 oz (73.7 kg)   LMP 07/08/2024   BMI 28.79 kg/m     Skin warm and dry. Navel is dry and not red. Pelvic: external genitalia is normal in appearance no lesions, vagina: white discharge with odor,urethra has no lesions or masses noted, cervix:smooth and bulbous, uterus: normal size, shape and contour, non tender, no masses felt, adnexa: no masses or tenderness noted. Bladder is non tender and no masses felt. CV swab obtained. Wet prep was +WBC and clue cells  Upstream - 08/01/24 1624       Pregnancy Intention Screening   Does the patient want to become pregnant in the next year? No    Does the patient's partner  want to become pregnant in the next year? No    Would the patient like to discuss contraceptive options today? No      Contraception Wrap Up   Current Method Oral Contraceptive    End Method Oral Contraceptive    Contraception Counseling Provided Yes         Examination chaperoned by Clarita Salt LPN  Assessment:     1. Pelvic cramping (Primary) Has cramping Use tylenol  and push fluids   2. Screening examination for STI CV swab sent for GC/CHL,trich, and BV and yeast - Cervicovaginal ancillary only( [place])  3. Vaginal discharge +white discharge CV swab sent  - POCT Wet Prep [facility]) - Cervicovaginal ancillary only( [place])  4. Vaginal odor +odor CV swab sent +Clue cells on wet prep, rx metrogel   - POCT Wet Prep ([facility] [place]) - Cervicovaginal ancillary only( [place])  5. BV (bacterial vaginosis) Rx metrogel  Meds ordered this encounter  Medications   metroNIDAZOLE  (METROGEL ) 0.75 % vaginal gel    Sig: Place 1 Applicatorful vaginally at bedtime.    Dispense:  70 g    Refill:  0    Supervising Provider:   JAYNE MINDER H [[id number]]    - POCT Wet Prep [facility])  6. Right ovarian cyst For removal 08/08/24    Plan:     Follow  up prn

## 2024-08-01 NOTE — Patient Instructions (Signed)
 Isabel Conner  08/01/2024     @PREFPERIOPPHARMACY @   Your procedure is scheduled on  08/08/2024.   [facility] to [facility] at  0730  A.M.   Call this number if you have problems the morning of surgery:  [phone]  If you experience any cold or flu symptoms such as cough, fever, chills, shortness of breath, etc. between now and your scheduled surgery, please notify us  at the above number.   Remember:   Do not eat after midnight.   You may drink clear liquids until 0530 am on 08/08/2024.    Clear liquids allowed are:                    Water , Juice (No red color; non-citric and without pulp; diabetics please choose diet or no sugar options), Carbonated beverages (diabetics please choose diet or no sugar options), Clear Tea (No creamer, milk, or cream, including half & half and powdered creamer), Black Coffee Only (No creamer, milk or cream, including half & half and powdered creamer), and Clear Sports drink (No red color; diabetics please choose diet or no sugar options)    Take these medicines the morning of surgery with A SIP OF WATER                                                      omeprazole.    Do not wear jewelry, make-up or nail polish, including gel polish,  artificial nails, or any other type of covering on natural nails (fingers and  toes).  Do not wear lotions, powders, or perfumes, or deodorant.  Do not shave 48 hours prior to surgery.  Men may shave face and neck.  Do not bring valuables to the hospital.  [facility] is not responsible for any belongings or valuables.  Contacts, dentures or bridgework may not be worn into surgery.  Leave your suitcase in the car.  After surgery it may be brought to your room.  For patients admitted to the hospital, discharge time will be determined by your treatment team.  Patients discharged the day of surgery will not be allowed to drive home and must have someone with them for 24 hours.    Special instructions:    DO NOT smoke tobacco or vape for 24 hours before your procedure.  Please read over the following fact sheets that you were given. Pain Booklet, Coughing and Deep Breathing, Surgical Site Infection Prevention, Anesthesia Post-op Instructions, and Care and Recovery After Surgery      Removing a Pocket of Fluid From an Ovary (Ovarian Cystectomy): What to Know After After this surgery, it's common to have pain and soreness in your belly, most often near the surgery site. You may also: Have a sore throat from the breathing tube. Feel tired for a few days. Your energy level will return to normal over the next few weeks. Follow these instructions at home: Medicines Take your medicines only as told. If you were given antibiotics, take them as told. Do not stop taking them even if you start to feel better. Do not take aspirin or ibuprofen  unless you're told to. You may need to take steps to help treat or prevent trouble pooping (constipation), such as: Taking medicines to help you poop. Eating foods high in  fiber, like beans, whole grains, and fresh fruits and vegetables. Drinking more fluids as told. Ask your health care provider if it's safe to drive or use machines while taking your medicine. Caring for your cut from surgery  Take care of your cut from surgery as told. Make sure you: Wash your hands with soap and water  for at least 20 seconds before and after you change your bandage. If you can't use soap and water , use hand sanitizer. Change your bandage. Leave stitches or skin glue alone. Leave tape strips alone unless you're told to take them off. You may trim the edges of the tape strips if they curl up. Check the area around your cut every day for signs of infection. Check for: More redness, swelling, or pain. More fluid or blood. Warmth. Pus or a bad smell. Do not take baths, swim, or use a hot tub until you're told it's OK. Ask if you can shower. Activity Rest as told. Take  short walks at least every 2 hours during the day. This helps you breathe better and keeps your blood flowing. Ask for help if you feel weak or unsteady. Do not lift anything heavier than 10 lb (4.5 kg) until you're told it's OK. Ask what things are safe for you to do at home. Ask when you can go back to work or school. General instructions Do not douche, use tampons, or have sex until your provider says it's OK. Do not smoke, vape, or use nicotine or tobacco. Doing this can slow down healing. Plan to have a responsible adult: Drive you home from the hospital or clinic. You won't be allowed to drive. Stay with you for the time you're told. Keep all follow-up visits. Your provider will check your healing and talk with you about your test results. Contact a health care provider if: You have a fever or chills. Your cuts from surgery open up. You have any signs of infection. You feel like you may throw up or throw up. You have pain when you pee or have blood in your urine. You have a rash. You have pain that's worse or not controlled with medicine. You have more swelling or bloating in your belly. If you can't reach your provider, go to an urgent care or emergency room. Get help right away if: You have chest pain. You feel dizzy or faint. You have shortness of breath or trouble breathing. You have trouble speaking or seeing. You have pain, swelling, or redness in one arm or leg. These symptoms may be an emergency. Have someone take you to an urgent care or emergency room, or call 911 right away. Do not wait to see if the symptoms will go away. Do not drive yourself to the hospital. This information is not intended to replace advice given to you by your health care provider. Make sure you discuss any questions you have with your health care provider. Document Revised: 06/08/2023 Document Reviewed: 06/08/2023 Elsevier Patient Education  2024 Else[employer] Inc.General Anesthesia, Adult, Care  After The following information offers guidance on how to care for yourself after your procedure. Your health care provider may also give you more specific instructions. If you have problems or questions, contact your health care provider. What can I expect after the procedure? After the procedure, it is common for people to: Have pain or discomfort at the IV site. Have nausea or vomiting. Have a sore throat or hoarseness. Have trouble concentrating. Feel cold or chills. Feel weak, sleepy, or  tired (fatigue). Have soreness and body aches. These can affect parts of the body that were not involved in surgery. Follow these instructions at home: For the time period you were told by your health care provider:  Rest. Do not participate in activities where you could fall or become injured. Do not drive or use machinery. Do not drink alcohol. Do not take sleeping pills or medicines that cause drowsiness. Do not make important decisions or sign legal documents. Do not take care of children on your own. General instructions Drink enough fluid to keep your urine pale yellow. If you have sleep apnea, surgery and certain medicines can increase your risk for breathing problems. Follow instructions from your health care provider about wearing your sleep device: Anytime you are sleeping, including during daytime naps. While taking prescription pain medicines, sleeping medicines, or medicines that make you drowsy. Return to your normal activities as told by your health care provider. Ask your health care provider what activities are safe for you. Take over-the-counter and prescription medicines only as told by your health care provider. Do not use any products that contain nicotine or tobacco. These products include cigarettes, chewing tobacco, and vaping devices, such as e-cigarettes. These can delay incision healing after surgery. If you need help quitting, ask your health care provider. Contact a  health care provider if: You have nausea or vomiting that does not get better with medicine. You vomit every time you eat or drink. You have pain that does not get better with medicine. You cannot urinate or have bloody urine. You develop a skin rash. You have a fever. Get help right away if: You have trouble breathing. You have chest pain. You vomit blood. These symptoms may be an emergency. Get help right away. Call 911. Do not wait to see if the symptoms will go away. Do not drive yourself to the hospital. Summary After the procedure, it is common to have a sore throat, hoarseness, nausea, vomiting, or to feel weak, sleepy, or fatigue. For the time period you were told by your health care provider, do not drive or use machinery. Get help right away if you have difficulty breathing, have chest pain, or vomit blood. These symptoms may be an emergency. This information is not intended to replace advice given to you by your health care provider. Make sure you discuss any questions you have with your health care provider. Document Revised: 02/12/2022 Document Reviewed: 02/12/2022 Elsevier Patient Education  2024 Elsevier Inc.How to Use Chlorhexidine at Home in the Shower Chlorhexidine gluconate (CHG) is a germ-killing (antiseptic) wash that's used to clean the skin. It can get rid of the germs that normally live on the skin and can keep them away for about 24 hours. If you're having surgery, you may be told to shower with CHG at home the night before surgery. This can help lower your risk for infection. To use CHG wash in the shower, follow the steps below. Supplies needed: CHG body wash. Clean washcloth. Clean towel. How to use CHG in the shower Follow these steps unless you're told to use CHG in a different way: Start the shower. Use your normal soap and shampoo to wash your face and hair. Turn off the shower or move out of the shower stream. Pour CHG onto a clean washcloth. Do not  use any type of brush or rough sponge. Start at your neck, washing your body down to your toes. Make sure you: Wash the part of your body where  the surgery will be done for at least 1 minute. Do not scrub. Do not use CHG on your head or face unless your health care provider tells you to. If it gets into your ears or eyes, rinse them well with water . Do not wash your genitals with CHG. Wash your back and under your arms. Make sure to wash skin folds. Let the CHG sit on your skin for 1-2 minutes or as long as told. Rinse your entire body in the shower, including all body creases and folds. Turn off the shower. Dry off with a clean towel. Do not put anything on your skin afterward, such as powder, lotion, or perfume. Put on clean clothes or pajamas. If it's the night before surgery, sleep in clean sheets. General tips Use CHG only as told, and follow the instructions on the label. Use the full amount of CHG as told. This is often one bottle. Do not smoke and stay away from flames after using CHG. Your skin may feel sticky after using CHG. This is normal. The sticky feeling will go away as the CHG dries. Do not use CHG: If you have a chlorhexidine  allergy or have reacted to chlorhexidine  in the past. On open wounds or areas of skin that have broken skin, cuts, or scrapes. On babies younger than 32 months of age. Contact a health care provider if: You have questions about using CHG. Your skin gets irritated or itchy. You have a rash after using CHG. You swallow any CHG. Call your local poison control center [phone] in the [place] Your eyes itch badly, or they become very red or swollen. Your hearing changes. You have trouble seeing. If you can't reach your provider, go to an urgent care or emergency room. Do not drive yourself. Get help right away if: You have swelling or tingling in your mouth or throat. You make high-pitched whistling sounds when you breathe, most often when you  breathe out (wheeze). You have trouble breathing. These symptoms may be an emergency. Call 911 right away. Do not wait to see if the symptoms will go away. Do not drive yourself to the hospital. This information is not intended to replace advice given to you by your health care provider. Make sure you discuss any questions you have with your health care provider. Document Revised: 05/31/2023 Document Reviewed: 05/27/2022 Elsevier Patient Education  2024 Elsevier Inc.Colonoscopy, Adult, Care After The following information offers guidance on how to care for yourself after your procedure. Your health care provider may also give you more specific instructions. If you have problems or questions, contact your health care provider. What can I expect after the procedure? After the procedure, it is common to have: A small amount of blood in your stool for 24 hours after the procedure. Some gas. Mild cramping or bloating of your abdomen. Follow these instructions at home: Eating and drinking  Drink enough fluid to keep your urine pale yellow. Follow instructions from your health care provider about eating or drinking restrictions. Resume your normal diet as told by your health care provider. Avoid heavy or fried foods that are hard to digest. Activity Rest as told by your health care provider. Avoid sitting for a long time without moving. Get up to take short walks every 1-2 hours. This is important to improve blood flow and breathing. Ask for help if you feel weak or unsteady. Return to your normal activities as told by your health care provider. Ask your health care provider what  activities are safe for you. Managing cramping and bloating  Try walking around when you have cramps or feel bloated. If directed, apply heat to your abdomen as told by your health care provider. Use the heat source that your health care provider recommends, such as a moist heat pack or a heating pad. Place a towel  between your skin and the heat source. Leave the heat on for 20-30 minutes. Remove the heat if your skin turns bright red. This is especially important if you are unable to feel pain, heat, or cold. You have a greater risk of getting burned. General instructions If you were given a sedative during the procedure, it can affect you for several hours. Do not drive or operate machinery until your health care provider says that it is safe. For the first 24 hours after the procedure: Do not sign important documents. Do not drink alcohol. Do your regular daily activities at a slower pace than normal. Eat soft foods that are easy to digest. Take over-the-counter and prescription medicines only as told by your health care provider. Keep all follow-up visits. This is important. Contact a health care provider if: You have blood in your stool 2-3 days after the procedure. Get help right away if: You have more than a small spotting of blood in your stool. You have large blood clots in your stool. You have swelling of your abdomen. You have nausea or vomiting. You have a fever. You have increasing pain in your abdomen that is not relieved with medicine. These symptoms may be an emergency. Get help right away. Call 911. Do not wait to see if the symptoms will go away. Do not drive yourself to the hospital. Summary After the procedure, it is common to have a small amount of blood in your stool. You may also have mild cramping and bloating of your abdomen. If you were given a sedative during the procedure, it can affect you for several hours. Do not drive or operate machinery until your health care provider says that it is safe. Get help right away if you have a lot of blood in your stool, nausea or vomiting, a fever, or increased pain in your abdomen. This information is not intended to replace advice given to you by your health care provider. Make sure you discuss any questions you have with your health  care provider. Document Revised: 12/28/2022 Document Reviewed: 07/08/2021 Elsevier Patient Education  2024 [employer].

## 2024-08-03 ENCOUNTER — Encounter (HOSPITAL_COMMUNITY): Payer: Self-pay

## 2024-08-03 ENCOUNTER — Encounter (HOSPITAL_COMMUNITY)
Admission: RE | Admit: 2024-08-03 | Discharge: 2024-08-03 | Disposition: A | Discharge status: Home / Self Care | Source: Ambulatory Visit | Attending: Obstetrics & Gynecology | Admitting: Obstetrics & Gynecology

## 2024-08-03 VITALS — BP 102/65 | HR 69 | Resp 18 | Ht 63.0 in | Wt 162.5 lb

## 2024-08-03 DIAGNOSIS — N83201 Unspecified ovarian cyst, right side: Secondary | ICD-10-CM | POA: Diagnosis not present

## 2024-08-03 DIAGNOSIS — R102 Pelvic and perineal pain: Secondary | ICD-10-CM | POA: Insufficient documentation

## 2024-08-03 DIAGNOSIS — Z01812 Encounter for preprocedural laboratory examination: Secondary | ICD-10-CM | POA: Insufficient documentation

## 2024-08-03 DIAGNOSIS — Z01818 Encounter for other preprocedural examination: Secondary | ICD-10-CM

## 2024-08-03 LAB — TYPE AND SCREEN
ABO/RH(D): A POS
Antibody Screen: NEGATIVE

## 2024-08-03 LAB — CERVICOVAGINAL ANCILLARY ONLY
Bacterial Vaginitis (gardnerella): POSITIVE — AB
Candida Glabrata: NEGATIVE
Candida Vaginitis: POSITIVE — AB
Chlamydia: POSITIVE — AB
Comment: NEGATIVE
Comment: NEGATIVE
Comment: NEGATIVE
Comment: NEGATIVE
Comment: NEGATIVE
Comment: NORMAL
Neisseria Gonorrhea: NEGATIVE
Trichomonas: NEGATIVE

## 2024-08-03 LAB — BASIC METABOLIC PANEL WITH GFR
Anion gap: 11 (ref 5–15)
BUN: 14 mg/dL (ref 6–20)
CO2: 22 mmol/L (ref 22–32)
Calcium: 8.9 mg/dL (ref 8.9–10.3)
Chloride: 104 mmol/L (ref 98–111)
Creatinine, Ser: 0.44 mg/dL (ref 0.44–1.00)
GFR, Estimated: >60 mL/min (ref >60)
Glucose, Bld: 85 mg/dL (ref 70–99)
Potassium: 4 mmol/L (ref 3.5–5.1)
Sodium: 137 mmol/L (ref 135–145)

## 2024-08-03 LAB — PREGNANCY, URINE: Preg Test, Ur: NEGATIVE

## 2024-08-05 ENCOUNTER — Ambulatory Visit: Payer: Self-pay | Admitting: Adult Health

## 2024-08-05 DIAGNOSIS — A749 Chlamydial infection, unspecified: Secondary | ICD-10-CM | POA: Insufficient documentation

## 2024-08-05 MED ORDER — FLUCONAZOLE 150 MG PO TABS: ORAL_TABLET | ORAL | 1 refills | Status: DC

## 2024-08-05 MED ORDER — AZITHROMYCIN 500 MG PO TABS: ORAL_TABLET | ORAL | 0 refills | Status: DC

## 2024-08-06 NOTE — H&P (Incomplete)
 [facility] Attending History and Physical  Isabel Conner is a 28 y.o. [phone] who presents for scheduled Robotic assisted laparoscopic right ovarian cystectomy  In review, This has been an ongoing issue since her prior pregnancy. The cyst has continued to slowly increase in size. The last ultrasound was completed in April 2025 where the cyst measured approximately 8 to 9 cm in size. Initially she was fine with just conservative management; however, she has noted worsening pain over the past few months.   Of note, pt recently diagnosed with Chlamydia- s/p treatment. 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   ADENOIDECTOMY     CESAREAN SECTION N/A 09/26/2021   Procedure: CESAREAN SECTION;  Surgeon: Eveline Lynwood MATSU, MD;  Location: [facility] LD ORS;  Service: Obstetrics;  Laterality: N/A;   LAPAROSCOPIC GASTRIC SLEEVE RESECTION  02/2023   TONSILLECTOMY     OB History  Gravida Para Term Preterm AB Living  5 1 1  4 1   SAB IAB Ectopic Multiple Live Births  1 1 2  0 1    # Outcome Date GA Lbr Len/2nd Weight Sex Type Anes PTL Lv  5 Term 09/26/21 [redacted]w[redacted]d  3800 g M CS-Vac EPI  LIV     Birth Comments: Term infant with mild respiratory distress; see Consult note for additional findings  4 IAB 2016          3 Ectopic           2 SAB           1 Ectopic             Obstetric Comments  Reports the ectopic pregnancies passed naturally; no surgery or methotrexate  Patient denies any other pertinent gynecologic issues.  No current facility-administered medications on file prior to encounter.   Current Outpatient Medications on File Prior to Encounter  Medication Sig Dispense Refill   calcium  carbonate (OSCAL) 1500 (600 Ca) MG  TABS tablet Take 600 mg of elemental calcium  by mouth daily.     cholecalciferol (VITAMIN D3) 25 MCG (1000 UNIT) tablet Take 1,000 Units by mouth in the morning and at bedtime.     desogestrel -ethinyl estradiol  (ISIBLOOM ) 0.15-30 MG-MCG tablet Take 1 tablet by mouth daily.     ferrous sulfate 324 MG TBEC Take 324 mg by mouth.     magnesium oxide (MAG-OX) 400 (240 Mg) MG tablet Take 600 mg by mouth 2 (two) times daily.     Multiple Vitamins-Iron (QC DAILY MULTIVITAMINS/IRON) TABS Take 1 tablet by mouth daily.     nystatin cream (MYCOSTATIN) Apply 1 Application topically 2 (two) times daily as needed for dry skin.     omeprazole (PRILOSEC) 40 MG capsule Take 40 mg by mouth daily as needed (heartburn).     Allergies  Allergen Reactions   Amoxicillin Anaphylaxis   Penicillins Anaphylaxis    Has patient had a PCN reaction causing immediate rash, facial/tongue/throat swelling, SOB or lightheadedness with hypotension: Yes Has patient had a PCN reaction causing severe rash involving mucus membranes or skin necrosis: No Has patient had a PCN reaction that required hospitalization No Has patient  had a PCN reaction occurring within the last 10 years: No If all of the above answers are NO, then may proceed with Cephalosporin use.    Aleve  [Naproxen  Sodium] Other (See Comments)    Chest pain. Pt states she can take Iburprofen    Social History:   reports that she has never smoked. She has never used smokeless tobacco. She reports current alcohol use of about 3.0 standard drinks of alcohol per week. She reports current drug use. Drug: Marijuana. Family History  Problem Relation Age of Onset   Diabetes Mother    Hypertension Mother    Pulmonary embolism Mother        caused death   Hypertension Father    Diabetes Maternal Grandmother    Diabetes Paternal Grandmother     Review of Systems: Pertinent items noted in HPI and remainder of comprehensive ROS otherwise negative.  PHYSICAL EXAM: Last  menstrual period 07/08/2024. CONSTITUTIONAL: Well-developed, well-nourished female in no acute distress.  SKIN: Skin is warm and dry. No rash noted. Not diaphoretic. No erythema. No pallor. NEUROLOGIC: Alert and oriented to person, place, and time. Normal reflexes, muscle tone coordination. No cranial nerve deficit noted. PSYCHIATRIC: Normal mood and affect. Normal behavior. Normal judgment and thought content. CARDIOVASCULAR: Normal heart rate noted, regular rhythm RESPIRATORY: Effort and breath sounds normal, no problems with respiration noted ABDOMEN: Soft, nontender, nondistended. PELVIC: deferred MUSCULOSKELETAL: no calf tenderness bilaterally EXT: no edema bilaterally, normal pulses  Labs: Results for orders placed or performed during the hospital encounter of 08/03/24 (from the past 2 weeks)  Basic metabolic panel   Collection Time: 08/03/24  9:53 AM  Result Value Ref Range   Sodium 137 135 - 145 mmol/L   Potassium 4.0 3.5 - 5.1 mmol/L   Chloride 104 98 - 111 mmol/L   CO2 22 22 - 32 mmol/L   Glucose, Bld 85 70 - 99 mg/dL   BUN 14 6 - 20 mg/dL   Creatinine, Ser 9.55 0.44 - 1.00 mg/dL   Calcium  8.9 8.9 - 10.3 mg/dL   GFR, Estimated >39 >39 mL/min   Anion gap 11 5 - 15  Pregnancy, urine   Collection Time: 08/03/24  9:53 AM  Result Value Ref Range   Preg Test, Ur NEGATIVE NEGATIVE  Type and screen [facility]   Collection Time: 08/03/24  9:53 AM  Result Value Ref Range   ABO/RH(D) A POS    Antibody Screen NEG    Sample Expiration 08/17/2024,2359    Extend sample reason      NO TRANSFUSIONS OR PREGNANCY IN THE PAST 3 MONTHS Performed at [facility], [address] [place], [place] [address]   Results for orders placed or performed in visit on 08/01/24 (from the past 2 weeks)  Cervicovaginal ancillary only( [place])   Collection Time: 08/01/24  4:51 PM  Result Value Ref Range   Neisseria Gonorrhea Negative    Chlamydia Positive (A)    Trichomonas Negative     Bacterial Vaginitis (gardnerella) Positive (A)    Candida Vaginitis Positive (A)    Candida Glabrata Negative    Comment      Normal Reference Range Bacterial Vaginosis - Negative   Comment Normal Reference Range Candida Species - Negative    Comment Normal Reference Range Candida Galbrata - Negative    Comment Normal Reference Range Trichomonas - Negative    Comment Normal Reference Ranger Chlamydia - Negative    Comment      Normal Reference  Range Neisseria Gonorrhea - Negative  POCT Wet Prep [place])   Collection Time: 08/01/24  4:52 PM  Result Value Ref Range   Source Wet Prep POC vagina    WBC, Wet Prep HPF POC pos    Bacteria Wet Prep HPF POC     Bacteria Wet Prep Morphology POC     Clue Cells Wet Prep HPF POC Many (A) None   Clue Cells Wet Prep Whiff POC Positive Whiff    Yeast Wet Prep HPF POC     KOH Wet Prep POC     Trichomonas Wet Prep HPF POC      Imaging Studies: No results found.  Assessment:b -pelvic pain -right ovarian cyst   Plan: Robotic assisted laparoscopic right ovarian cystectomy -NPO -LR @ 125cc/hr -SCDs to OR -Risk/benefits and alternatives reviewed with the patient including but not limited to risk of bleeding, infection and injury to surrounding organs.  Questions and concerns were addressed and pt desires to proceed  Cadence Haslam, DO Attending Obstetrician & Gynecologist, [facility], [facility]

## 2024-08-08 ENCOUNTER — Ambulatory Visit (HOSPITAL_COMMUNITY)

## 2024-08-08 ENCOUNTER — Encounter (HOSPITAL_COMMUNITY): Payer: Self-pay | Admitting: Obstetrics & Gynecology

## 2024-08-08 ENCOUNTER — Encounter (HOSPITAL_COMMUNITY)
Admission: RE | Disposition: A | Discharge status: Home / Self Care | Payer: Self-pay | Source: Home / Self Care | Attending: Obstetrics & Gynecology

## 2024-08-08 ENCOUNTER — Ambulatory Visit (HOSPITAL_BASED_OUTPATIENT_CLINIC_OR_DEPARTMENT_OTHER)

## 2024-08-08 ENCOUNTER — Ambulatory Visit (HOSPITAL_COMMUNITY)
Admission: RE | Admit: 2024-08-08 | Discharge: 2024-08-08 | Disposition: A | Discharge status: Home / Self Care | Attending: Obstetrics & Gynecology | Admitting: Obstetrics & Gynecology

## 2024-08-08 DIAGNOSIS — D270 Benign neoplasm of right ovary: Secondary | ICD-10-CM | POA: Diagnosis not present

## 2024-08-08 DIAGNOSIS — R102 Pelvic and perineal pain: Secondary | ICD-10-CM

## 2024-08-08 DIAGNOSIS — D649 Anemia, unspecified: Secondary | ICD-10-CM | POA: Insufficient documentation

## 2024-08-08 DIAGNOSIS — N83201 Unspecified ovarian cyst, right side: Secondary | ICD-10-CM | POA: Diagnosis present

## 2024-08-08 DIAGNOSIS — N83291 Other ovarian cyst, right side: Secondary | ICD-10-CM

## 2024-08-08 DIAGNOSIS — Z01818 Encounter for other preprocedural examination: Secondary | ICD-10-CM

## 2024-08-08 DIAGNOSIS — N736 Female pelvic peritoneal adhesions (postinfective): Secondary | ICD-10-CM

## 2024-08-08 HISTORY — PX: ROBOTIC ASSISTED LAPAROSCOPIC LYSIS OF ADHESION: SHX6080

## 2024-08-08 HISTORY — PX: ROBOTIC ASSISTED LAPAROSCOPIC OVARIAN CYSTECTOMY: SHX6081

## 2024-08-08 LAB — CBC
HCT: 33.7 % — ABNORMAL LOW (ref 36.0–46.0)
Hemoglobin: 10.4 g/dL — ABNORMAL LOW (ref 12.0–15.0)
MCH: 24.1 pg — ABNORMAL LOW (ref 26.0–34.0)
MCHC: 30.9 g/dL (ref 30.0–36.0)
MCV: 78 fL — ABNORMAL LOW (ref 80.0–100.0)
Platelets: 139 K/uL — ABNORMAL LOW (ref 150–400)
RBC: 4.32 MIL/uL (ref 3.87–5.11)
RDW: 15.1 % (ref 11.5–15.5)
WBC: 6.7 K/uL (ref 4.0–10.5)
nRBC: 0 % (ref 0.0–0.2)

## 2024-08-08 SURGERY — EXCISION, CYST, OVARY, ROBOT-ASSISTED, LAPAROSCOPIC
Anesthesia: General | Site: Abdomen | Laterality: Right

## 2024-08-08 MED ORDER — GLYCOPYRROLATE PF 0.2 MG/ML IJ SOSY
PREFILLED_SYRINGE | INTRAMUSCULAR | Status: DC | PRN
  Administered 2024-08-08: .2 mg via INTRAVENOUS

## 2024-08-08 MED ORDER — EPHEDRINE SULFATE-NACL 50-0.9 MG/10ML-% IV SOSY
PREFILLED_SYRINGE | INTRAVENOUS | Status: DC | PRN
  Administered 2024-08-08 (×2): 5 mg via INTRAVENOUS
  Administered 2024-08-08 (×2): 7.5 mg via INTRAVENOUS

## 2024-08-08 MED ORDER — LIDOCAINE 2% (20 MG/ML) 5 ML SYRINGE
INTRAMUSCULAR | Status: AC
  Filled 2024-08-08: qty 5

## 2024-08-08 MED ORDER — PHENYLEPHRINE 80 MCG/ML (10ML) SYRINGE FOR IV PUSH (FOR BLOOD PRESSURE SUPPORT)
PREFILLED_SYRINGE | INTRAVENOUS | Status: DC | PRN
  Administered 2024-08-08: 80 ug via INTRAVENOUS

## 2024-08-08 MED ORDER — DEXAMETHASONE SODIUM PHOSPHATE 10 MG/ML IJ SOLN
INTRAMUSCULAR | Status: AC
  Filled 2024-08-08: qty 1

## 2024-08-08 MED ORDER — AZITHROMYCIN 500 MG PO TABS: ORAL_TABLET | ORAL | 0 refills | Status: DC

## 2024-08-08 MED ORDER — OXYCODONE HCL 5 MG PO TABS: 5.0000 mg | ORAL_TABLET | Freq: Four times a day (QID) | ORAL | 0 refills | Status: AC | PRN

## 2024-08-08 MED ORDER — DOCUSATE SODIUM 100 MG PO CAPS: 100.0000 mg | ORAL_CAPSULE | Freq: Two times a day (BID) | ORAL | 0 refills | Status: AC

## 2024-08-08 MED ORDER — DEXMEDETOMIDINE HCL IN NACL 80 MCG/20ML IV SOLN
INTRAVENOUS | Status: DC | PRN
  Administered 2024-08-08: 8 ug via INTRAVENOUS

## 2024-08-08 MED ORDER — ONDANSETRON 4 MG PO TBDP: 4.0000 mg | ORAL_TABLET | Freq: Three times a day (TID) | ORAL | 0 refills | Status: AC | PRN

## 2024-08-08 MED ORDER — ONDANSETRON HCL 4 MG/2ML IJ SOLN
INTRAMUSCULAR | Status: DC | PRN
  Administered 2024-08-08: 4 mg via INTRAVENOUS

## 2024-08-08 MED ORDER — SUGAMMADEX SODIUM 200 MG/2ML IV SOLN
INTRAVENOUS | Status: DC | PRN
  Administered 2024-08-08: 150 mg via INTRAVENOUS

## 2024-08-08 MED ORDER — LACTATED RINGERS IV SOLN: INTRAVENOUS | Status: DC

## 2024-08-08 MED ORDER — FENTANYL CITRATE (PF) 100 MCG/2ML IJ SOLN
INTRAMUSCULAR | Status: DC | PRN
  Administered 2024-08-08 (×6): 50 ug via INTRAVENOUS

## 2024-08-08 MED ORDER — DEXMEDETOMIDINE HCL IN NACL 80 MCG/20ML IV SOLN
INTRAVENOUS | Status: AC
  Filled 2024-08-08: qty 20

## 2024-08-08 MED ORDER — PROPOFOL 10 MG/ML IV BOLUS
INTRAVENOUS | Status: AC
  Filled 2024-08-08: qty 20

## 2024-08-08 MED ORDER — PROPOFOL 10 MG/ML IV BOLUS
INTRAVENOUS | Status: DC | PRN
  Administered 2024-08-08: 150 mg via INTRAVENOUS
  Administered 2024-08-08: 50 mg via INTRAVENOUS

## 2024-08-08 MED ORDER — ROCURONIUM BROMIDE 10 MG/ML (PF) SYRINGE
PREFILLED_SYRINGE | INTRAVENOUS | Status: DC | PRN
  Administered 2024-08-08: 70 mg via INTRAVENOUS

## 2024-08-08 MED ORDER — EPHEDRINE 5 MG/ML INJ
INTRAVENOUS | Status: AC
  Filled 2024-08-08: qty 5

## 2024-08-08 MED ORDER — OXYCODONE HCL 5 MG PO TABS
5.0000 mg | ORAL_TABLET | Freq: Once | ORAL | Status: AC | PRN
  Administered 2024-08-08: 5 mg via ORAL
  Filled 2024-08-08: qty 1

## 2024-08-08 MED ORDER — DEXAMETHASONE SODIUM PHOSPHATE 10 MG/ML IJ SOLN: 8.0000 mg | Freq: Once | INTRAMUSCULAR | Status: DC | PRN

## 2024-08-08 MED ORDER — ONDANSETRON HCL 4 MG/2ML IJ SOLN
INTRAMUSCULAR | Status: AC
  Filled 2024-08-08: qty 2

## 2024-08-08 MED ORDER — SCOPOLAMINE 1 MG/3DAYS TD PT72
1.0000 | MEDICATED_PATCH | Freq: Once | TRANSDERMAL | Status: DC
  Administered 2024-08-08: 1 mg via TRANSDERMAL
  Filled 2024-08-08: qty 1

## 2024-08-08 MED ORDER — ROCURONIUM BROMIDE 10 MG/ML (PF) SYRINGE
PREFILLED_SYRINGE | INTRAVENOUS | Status: AC
  Filled 2024-08-08: qty 10

## 2024-08-08 MED ORDER — SUGAMMADEX SODIUM 200 MG/2ML IV SOLN
INTRAVENOUS | Status: AC
  Filled 2024-08-08: qty 2

## 2024-08-08 MED ORDER — GLYCOPYRROLATE PF 0.2 MG/ML IJ SOSY
PREFILLED_SYRINGE | INTRAMUSCULAR | Status: AC
  Filled 2024-08-08: qty 1

## 2024-08-08 MED ORDER — FENTANYL CITRATE (PF) 100 MCG/2ML IJ SOLN
INTRAMUSCULAR | Status: AC
  Filled 2024-08-08: qty 2

## 2024-08-08 MED ORDER — LIDOCAINE 2% (20 MG/ML) 5 ML SYRINGE
INTRAMUSCULAR | Status: DC | PRN
  Administered 2024-08-08: 60 mg via INTRAVENOUS

## 2024-08-08 MED ORDER — BUPIVACAINE HCL (PF) 0.5 % IJ SOLN
INTRAMUSCULAR | Status: DC | PRN
  Administered 2024-08-08: 40 mL

## 2024-08-08 MED ORDER — ISIBLOOM 0.15-30 MG-MCG PO TABS: 1.0000 | ORAL_TABLET | Freq: Every day | ORAL | 4 refills | Status: AC

## 2024-08-08 MED ORDER — POVIDONE-IODINE 10 % EX SWAB
2.0000 | Freq: Once | CUTANEOUS | Status: AC
  Administered 2024-08-08: 2 via TOPICAL

## 2024-08-08 MED ORDER — BUPIVACAINE HCL (PF) 0.25 % IJ SOLN
INTRAMUSCULAR | Status: AC
  Filled 2024-08-08: qty 60

## 2024-08-08 MED ORDER — CEFAZOLIN SODIUM 1 G IJ SOLR
INTRAMUSCULAR | Status: AC
  Filled 2024-08-08: qty 20

## 2024-08-08 MED ORDER — OXYCODONE HCL 5 MG/5ML PO SOLN: 5.0000 mg | Freq: Once | ORAL | Status: AC | PRN

## 2024-08-08 MED ORDER — FENTANYL CITRATE PF 50 MCG/ML IJ SOSY
25.0000 ug | PREFILLED_SYRINGE | INTRAMUSCULAR | Status: DC | PRN
  Administered 2024-08-08: 50 ug via INTRAVENOUS
  Filled 2024-08-08: qty 1

## 2024-08-08 MED ORDER — DEXAMETHASONE SODIUM PHOSPHATE 10 MG/ML IJ SOLN
INTRAMUSCULAR | Status: DC | PRN
  Administered 2024-08-08: 5 mg via INTRAVENOUS

## 2024-08-08 MED ORDER — CHLORHEXIDINE GLUCONATE 0.12 % MT SOLN
15.0000 mL | Freq: Once | OROMUCOSAL | Status: AC
  Administered 2024-08-08: 15 mL via OROMUCOSAL

## 2024-08-08 MED ORDER — STERILE WATER FOR IRRIGATION IR SOLN
Status: DC | PRN
  Administered 2024-08-08: 500 mL

## 2024-08-08 MED ORDER — ORAL CARE MOUTH RINSE: 15.0000 mL | Freq: Once | OROMUCOSAL | Status: AC

## 2024-08-08 SURGICAL SUPPLY — 38 items
CHLORAPREP W/TINT 26 (MISCELLANEOUS) ×2 IMPLANT
COVER LIGHT HANDLE (MISCELLANEOUS) IMPLANT
COVER MAYO STAND XLG (MISCELLANEOUS) ×2 IMPLANT
COVER TIP SHEARS 8 DVNC (MISCELLANEOUS) IMPLANT
DERMABOND ADVANCED .7 DNX12 (GAUZE/BANDAGES/DRESSINGS) ×2 IMPLANT
DRAPE ARM DVNC X/XI (DISPOSABLE) ×6 IMPLANT
DRAPE COLUMN DVNC XI (DISPOSABLE) ×2 IMPLANT
ELECTRODE REM PT RTRN 9FT ADLT (ELECTROSURGICAL) ×2 IMPLANT
FORCEPS BPLR FENES DVNC XI (FORCEP) ×2 IMPLANT
GAUZE 4X4 16PLY ~~LOC~~+RFID DBL (SPONGE) ×4 IMPLANT
GLOVE BIO SURGEON STRL SZ 6.5 (GLOVE) ×4 IMPLANT
GLOVE BIOGEL PI IND STRL 7.0 (GLOVE) ×10 IMPLANT
GOWN STRL REUS W/ TWL LRG LVL3 (GOWN DISPOSABLE) ×4 IMPLANT
GOWN STRL REUS W/TWL LRG LVL3 (GOWN DISPOSABLE) ×2 IMPLANT
KIT PINK PAD W/HEAD ARM REST (MISCELLANEOUS) ×2 IMPLANT
KIT TURNOVER CYSTO (KITS) ×2 IMPLANT
MANIFOLD NEPTUNE II (INSTRUMENTS) ×2 IMPLANT
NDL HYPO 25X1 1.5 SAFETY (NEEDLE) ×2 IMPLANT
NDL INSUFFLATION 14GA 120MM (NEEDLE) ×2 IMPLANT
NEEDLE HYPO 25X1 1.5 SAFETY (NEEDLE) ×2 IMPLANT
NEEDLE INSUFFLATION 14GA 120MM (NEEDLE) ×2 IMPLANT
OBTURATOR OPTICALSTD 8 DVNC (TROCAR) ×2 IMPLANT
PACK LAP CHOLECYSTECTOMY (MISCELLANEOUS) IMPLANT
POSITIONER HEAD 8X9X4 ADT (SOFTGOODS) ×2 IMPLANT
SCISSORS MNPLR CVD DVNC XI (INSTRUMENTS) IMPLANT
SEAL UNIV 5-12 XI (MISCELLANEOUS) ×6 IMPLANT
SEALER VESSEL EXT DVNC XI (MISCELLANEOUS) IMPLANT
SET BASIN LINEN APH (SET/KITS/TRAYS/PACK) ×2 IMPLANT
SET TUBE SMOKE EVAC HIGH FLOW (TUBING) IMPLANT
SOL .9 NS 3000ML IRR UROMATIC (IV SOLUTION) ×2 IMPLANT
SOL ANTI FOG 6CC (MISCELLANEOUS) ×2 IMPLANT
SUT MNCRL AB 4-0 PS2 18 (SUTURE) ×2 IMPLANT
SUT VICRYL 0 UR6 27IN ABS (SUTURE) IMPLANT
SYR CONTROL 10ML LL (SYRINGE) ×2 IMPLANT
TAPE TRANSPORE STRL 2 31045 (GAUZE/BANDAGES/DRESSINGS) ×2 IMPLANT
TUBE CONNECTING 12X1/4 (SUCTIONS) IMPLANT
WATER STERILE IRR 500ML POUR (IV SOLUTION) ×2 IMPLANT
YANKAUER SUCT BULB TIP 10FT TU (MISCELLANEOUS) IMPLANT

## 2024-08-08 NOTE — Anesthesia Procedure Notes (Signed)
 Procedure Name: Intubation Date/Time: 08/08/2024 10:20 AM  Performed by: Barbarann Verneita RAMAN, CRNAPre-anesthesia Checklist: Patient identified, Patient being monitored, Timeout performed, Emergency Drugs available and Suction available Patient Re-evaluated:Patient Re-evaluated prior to induction Oxygen Delivery Method: Circle system utilized Preoxygenation: Pre-oxygenation with 100% oxygen Induction Type: IV induction and Cricoid Pressure applied Ventilation: Mask ventilation without difficulty Laryngoscope Size: Mac and 3 Grade View: Grade I Tube type: Oral Tube size: 7.0 mm Number of attempts: 1 Airway Equipment and Method: Stylet Placement Confirmation: ETT inserted through vocal cords under direct vision, positive ETCO2 and breath sounds checked- equal and bilateral Secured at: 22 cm Tube secured with: Tape Dental Injury: Teeth and Oropharynx as per pre-operative assessment

## 2024-08-08 NOTE — Op Note (Signed)
 PREOPERATIVE DIAGNOSIS:  1) Right ovarian cyst POSTOPERATIVE DIAGNOSIS: Right para-ovarian cyst PROCEDURE PERFORMED: Robotic assisted- laparoscopic lysis of adhesions, right para-ovarian cystectomy and right salpingectomy SURGEON: Dr. Delon Prude ANESTHESIA: General endotracheal.  ESTIMATED BLOOD LOSS: 5 cc.  IV FLUIDS: 900 cc of crystalloid.  SPECIMEN(S): right fallopian tube and right para-ovarian cyst COMPLICATIONS: None.  CONDITION: Stable.   FINDINGS: No ascites or peritoneal studding was appreciated.  Grossly normal appearing bowel.  Uterus normal size and shape.  Normal-appearing left tube and ovary.  Normal right ovary large 7 cm simple paraovarian cyst noted with torsion.  Left fimbriated noted, however, the remainder of the fallopian tube was not visualized and appeared to be part of the paraovarian cyst  Omental adhesions were noted on the left anterior abdominal wall.   Informed consent was obtained from the patient prior to taking her to the operating room where anesthesia was found to be adequate. She was placed in dorsal lithotomy position and examined under anesthesia. She was prepped and draped in normal sterile fashion. The bladder was catheterized with a foley under sterile technique.    Attention was then turned to the patient's abdomen where an 8mm supraumbilical skin incision was made with the scalpel. The veress needle was carefully introduced into the peritoneal cavity while tenting the abdominal wall. Intraperitoneal placement was confirmed by use of a saline-drop test.  The gas was connected and confirmed intrabdominal placement by a low initial pressure of 6 mmHg. The abdomen was then insuflated with CO2 gas. The trocar and sleeve were then advanced without difficulty into the abdomen under direct visualization. Intraabdominal placement was confirmed by the laparoscope and surveillance of the abdomen was performed. Grossly normal appearing abdomen as mentioned in  findings above.  Two additional 8mm skin incision were made  with placement of the trocar under direct visualization.  The uterus and bilateral fallopian tubes were already visualized and the da Vinci robotic device was docked.  Next, attention was turned to the console where the procedure was performed.     Due to the anterior abdominal incisions, there was difficulty obtaining adequate visualization.  Robotic scissors were then used to ligate the adhesions.  Excellent hemostasis was noted  The left fallopian tube and ovary appeared normal.  Attention was then turned to the large right adnexa.  It became evident that the cyst was separate from the ovary due to torsion of this paraovarian cyst.  Detorsion was completed there was a small portion of left fimbriae noted; however, no other abnormal fallopian tube was appreciated.  It appeared as though the paraovarian cyst may also include the fallopian tube.  Using the vessel sealer the cyst and fimbria a remnant were ligated.  Excellent hemostasis was noted.  An Endo Catch bag was then placed in the abdomen; however, due to the large size the cyst required drainage prior to removal.  The cyst drained clear to straw-colored fluid.  The cyst was then placed in the Endo Catch bag.  Under direct visualization TAP block was completed using 10cc of 0.25% marcaine  in each of four locations.  The specimen in an Endo Catch bag was then removed from the umbilicus incision in pieces due to its large size.  The umbilical fascia was closed with 0 Vicryl in a running fashion. The instruments were then removed from the patients abdomen with air allowed to fully escape. All ports were closed with monocryl and dermabond.  The patient tolerated the procedure well with all sponge, lap, and  needle counts correct. The patient was taken to recovery in stable condition.   Isabel Berte, DO Attending Obstetrician & Gynecologist, [facility] for [employer],  [facility]

## 2024-08-08 NOTE — Transfer of Care (Signed)
 Immediate Anesthesia Transfer of Care Note  Patient: Isabel Conner  Procedure(s) Performed: EXCISION, CYST, OVARY, ROBOT-ASSISTED, LAPAROSCOPIC (Right: Abdomen) LYSIS, ADHESIONS, ROBOT-ASSISTED, LAPAROSCOPIC (Abdomen)  Patient Location: PACU  Anesthesia Type:General  Level of Consciousness: awake and patient cooperative  Airway & Oxygen Therapy: Patient Spontanous Breathing  Post-op Assessment: Report given to RN and Post -op Vital signs reviewed and stable  Post vital signs: Reviewed and stable  Last Vitals:  Vitals Value Taken Time  BP 112/55 08/08/24  1157  Temp 97.9 08/08/24  1154  Pulse 67 08/08/24 11:52  Resp 13 08/08/24 11:52  SpO2 100 % 08/08/24 11:52  Vitals shown include unfiled device data.  Last Pain:  Vitals:   08/08/24 0814  TempSrc: Oral  PainSc: 0-No pain         Complications: No notable events documented.

## 2024-08-08 NOTE — Anesthesia Preprocedure Evaluation (Addendum)
 Anesthesia Evaluation  Patient identified by MRN, date of birth, ID band Patient awake    Reviewed: Allergy & Precautions, H&P , NPO status , Patient's Chart, lab work & pertinent test results  Airway Mallampati: II  TM Distance: >3 FB Neck ROM: Full    Dental no notable dental hx.    Pulmonary Current Smoker and Patient abstained from smoking. Marijuana; used last night   Pulmonary exam normal breath sounds clear to auscultation       Cardiovascular negative cardio ROS Normal cardiovascular exam Rhythm:Regular Rate:Normal     Neuro/Psych negative neurological ROS  negative psych ROS   GI/Hepatic negative GI ROS, Neg liver ROS,,,  Endo/Other  diabetes, Gestational    Renal/GU Renal disease  negative genitourinary   Musculoskeletal negative musculoskeletal ROS (+)    Abdominal   Peds negative pediatric ROS (+)  Hematology  (+) Blood dyscrasia, anemia   Anesthesia Other Findings   Reproductive/Obstetrics negative OB ROS                              Anesthesia Physical Anesthesia Plan  ASA: 2  Anesthesia Plan: General   Post-op Pain Management:    Induction: Intravenous  PONV Risk Score and Plan: Scopolamine  patch - Pre-op  Airway Management Planned: Oral ETT  Additional Equipment:   Intra-op Plan:   Post-operative Plan: Extubation in OR  Informed Consent: I have reviewed the patients History and Physical, chart, labs and discussed the procedure including the risks, benefits and alternatives for the proposed anesthesia with the patient or authorized representative who has indicated his/her understanding and acceptance.     Dental advisory given  Plan Discussed with: CRNA  Anesthesia Plan Comments:          Anesthesia Quick Evaluation

## 2024-08-08 NOTE — Discharge Instructions (Signed)
 HOME INSTRUCTIONS  Please note any unusual or excessive bleeding, pain, swelling. Mild dizziness or drowsiness are normal for about 24 hours after surgery.   Shower when comfortable  Restrictions: No driving for 24 hours or while taking pain medications.  Activity:  No heavy lifting (> 20 lbs), nothing in vagina (no tampons, douching, or intercourse) x 2 weeks; no tub baths for 2 weeks Vaginal spotting is expected but if your bleeding is heavy, period like,  please call the office   Incision: the bandaids will fall off when they are ready to; you may clean your incision with mild soap and water  but do not rub or scrub the incision site.  You may experience slight bloody drainage from your incision periodically.  This is normal.  If you experience a large amount of drainage or the incision opens, please call your physician who will likely direct you to the emergency department.  Diet:  You may return to your regular diet.  Do not eat large meals.  Eat small frequent meals throughout the day.  Continue to drink a good amount of water  at least 6-8 glasses of water  per day, hydration is very important for the healing process.  Pain Management: Take Tylenol  every 6 hours with food as needed for pain.  For moderate to severe pain, a prescription of oxycodone  has been sent in.  You can take this medication either with or alternate it with the oxycodone .  Always take prescription pain medication with food.  Oxycodone  may cause constipation, you may want to take a stool softener while taking this medication.  A prescription of colace has been sent in to take twice daily if needed while taking the oxycodone .  Be sure to drink plenty of fluids and increase your fiber to help with constipation.  Alcohol -- Avoid for 24 hours and while taking pain medications.  Nausea: Take sips of ginger ale or soda  Fever -- Call physician if temperature over 101 degrees  Follow up:  If you do not already have a follow  up appointment scheduled, please call the office at [phone].  If you experience fever (a temperature greater than 100.4), pain unrelieved by pain medication, shortness of breath, swelling of a single leg, or any other symptoms which are concerning to you please the office immediately.

## 2024-08-08 NOTE — Anesthesia Postprocedure Evaluation (Signed)
 Anesthesia Post Note  Patient: Isabel Conner  Procedure(s) Performed: EXCISION, CYST, OVARY, ROBOT-ASSISTED, LAPAROSCOPIC (Right: Abdomen) LYSIS, ADHESIONS, ROBOT-ASSISTED, LAPAROSCOPIC (Abdomen)  Patient location during evaluation: PACU Anesthesia Type: General Level of consciousness: awake and alert Pain management: pain level controlled Vital Signs Assessment: post-procedure vital signs reviewed and stable Respiratory status: spontaneous breathing, nonlabored ventilation, respiratory function stable and patient connected to nasal cannula oxygen Cardiovascular status: blood pressure returned to baseline and stable Postop Assessment: no apparent nausea or vomiting Anesthetic complications: no   No notable events documented.   Last Vitals:  Vitals:   08/08/24 1157 08/08/24 1200  BP: (!) 112/55 (!) 106/49  Pulse: 77 63  Resp: 13 15  Temp:    SpO2: 100% 100%    Last Pain:  Vitals:   08/08/24 1155  TempSrc:   PainSc: 0-No pain                 Andrea Limes

## 2024-08-09 ENCOUNTER — Ambulatory Visit: Payer: Self-pay | Admitting: Obstetrics & Gynecology

## 2024-08-09 ENCOUNTER — Encounter (HOSPITAL_COMMUNITY): Payer: Self-pay | Admitting: Obstetrics & Gynecology

## 2024-08-09 LAB — SURGICAL PATHOLOGY

## 2024-08-16 ENCOUNTER — Encounter: Payer: Self-pay | Admitting: Obstetrics & Gynecology

## 2024-08-16 ENCOUNTER — Ambulatory Visit: Admitting: Obstetrics & Gynecology

## 2024-08-16 VITALS — BP 106/71 | HR 61 | Ht 63.0 in | Wt 162.0 lb

## 2024-08-16 DIAGNOSIS — Z8619 Personal history of other infectious and parasitic diseases: Secondary | ICD-10-CM

## 2024-08-16 DIAGNOSIS — Z09 Encounter for follow-up examination after completed treatment for conditions other than malignant neoplasm: Secondary | ICD-10-CM

## 2024-08-16 DIAGNOSIS — Z48816 Encounter for surgical aftercare following surgery on the genitourinary system: Secondary | ICD-10-CM

## 2024-08-16 NOTE — Progress Notes (Signed)
    PostOp Visit Note  Isabel Conner is a 28 y.o. [phone] female who presents for a postoperative visit. She is 1 week postop following a Robotic assisted- laparoscopic lysis of adhesions, right para-ovarian cystectomy and right salpingectomy completed 08/08/2024  Today she notes that she is doing well.  She has had some intermittent throbbing pain on her right side, which is well relieved with medication.  She denies vaginal discharge, itch or irritation.  She did have her menses it was much lighter and notes less dysmenorrhea. Denies fever or chills.  Tolerating gen diet.  +Flatus, Regular BMs.   Overall doing well and reports no acute complaints   Review of Systems Pertinent items are noted in HPI.    Objective:  BP 106/71 (BP Location: Right Arm, Patient Position: Sitting, Cuff Size: Normal)   Pulse 61   Ht 5' 3 (1.6 m)   Wt 162 lb (73.5 kg)   LMP 08/13/2024   BMI 28.70 kg/m    Physical Examination:  GENERAL ASSESSMENT: well developed and well nourished SKIN: normal color, no lesions CHEST: normal air exchange, respiratory effort normal with no retractions HEART: regular rate and rhythm ABDOMEN: soft, non-distended, no rebound or guarding INCISION: well healed with dermabond EXTREMITY: no edema PSYCH: mood appropriate, normal affect       Assessment:    Postop visit  Recent Chlamydia treated  Plan:   -Meeting postoperative milestones appropriately - Recommended another 1 to 2 weeks of pelvic rest - Will plan to return to work on September 23, letter written - Will plan for [facility] in 1 to 2 weeks regarding recent chlamydia  Tziporah Knoke, DO Attending Obstetrician & Gynecologist, [facility] [employer], [facility]

## 2024-08-30 ENCOUNTER — Ambulatory Visit

## 2024-10-13 ENCOUNTER — Emergency Department (HOSPITAL_COMMUNITY)
Admission: EM | Admit: 2024-10-13 | Discharge: 2024-10-13 | Disposition: A | Discharge status: Home / Self Care | Attending: Emergency Medicine | Admitting: Emergency Medicine

## 2024-10-13 ENCOUNTER — Emergency Department (HOSPITAL_COMMUNITY)

## 2024-10-13 ENCOUNTER — Encounter (HOSPITAL_COMMUNITY): Payer: Self-pay

## 2024-10-13 ENCOUNTER — Other Ambulatory Visit: Payer: Self-pay

## 2024-10-13 DIAGNOSIS — S0083XA Contusion of other part of head, initial encounter: Secondary | ICD-10-CM | POA: Insufficient documentation

## 2024-10-13 DIAGNOSIS — S066X1A Traumatic subarachnoid hemorrhage with loss of consciousness of 30 minutes or less, initial encounter: Secondary | ICD-10-CM | POA: Diagnosis not present

## 2024-10-13 DIAGNOSIS — Z23 Encounter for immunization: Secondary | ICD-10-CM | POA: Insufficient documentation

## 2024-10-13 DIAGNOSIS — Y906 Blood alcohol level of 120-199 mg/100 ml: Secondary | ICD-10-CM | POA: Insufficient documentation

## 2024-10-13 DIAGNOSIS — S0990XA Unspecified injury of head, initial encounter: Secondary | ICD-10-CM | POA: Diagnosis present

## 2024-10-13 LAB — CBC WITH DIFFERENTIAL/PLATELET
Abs Immature Granulocytes: 0.07 K/uL (ref 0.00–0.07)
Basophils Absolute: 0.1 K/uL (ref 0.0–0.1)
Basophils Relative: 0 %
Eosinophils Absolute: 0 K/uL (ref 0.0–0.5)
Eosinophils Relative: 0 %
HCT: 40.1 % (ref 36.0–46.0)
Hemoglobin: 12.4 g/dL (ref 12.0–15.0)
Immature Granulocytes: 1 %
Lymphocytes Relative: 20 %
Lymphs Abs: 2.8 K/uL (ref 0.7–4.0)
MCH: 23.4 pg — ABNORMAL LOW (ref 26.0–34.0)
MCHC: 30.9 g/dL (ref 30.0–36.0)
MCV: 75.7 fL — ABNORMAL LOW (ref 80.0–100.0)
Monocytes Absolute: 1.1 K/uL — ABNORMAL HIGH (ref 0.1–1.0)
Monocytes Relative: 8 %
Neutro Abs: 10 K/uL — ABNORMAL HIGH (ref 1.7–7.7)
Neutrophils Relative %: 71 %
Platelets: 213 K/uL (ref 150–400)
RBC: 5.3 MIL/uL — ABNORMAL HIGH (ref 3.87–5.11)
RDW: 16.1 % — ABNORMAL HIGH (ref 11.5–15.5)
WBC: 14 K/uL — ABNORMAL HIGH (ref 4.0–10.5)
nRBC: 0 % (ref 0.0–0.2)

## 2024-10-13 LAB — BASIC METABOLIC PANEL WITH GFR
Anion gap: 15 (ref 5–15)
BUN: 9 mg/dL (ref 6–20)
CO2: 21 mmol/L — ABNORMAL LOW (ref 22–32)
Calcium: 8.8 mg/dL — ABNORMAL LOW (ref 8.9–10.3)
Chloride: 105 mmol/L (ref 98–111)
Creatinine, Ser: 0.51 mg/dL (ref 0.44–1.00)
GFR, Estimated: >60 mL/min (ref >60)
Glucose, Bld: 107 mg/dL — ABNORMAL HIGH (ref 70–99)
Potassium: 3.7 mmol/L (ref 3.5–5.1)
Sodium: 141 mmol/L (ref 135–145)

## 2024-10-13 LAB — ETHANOL: Alcohol, Ethyl (B): 146 mg/dL — ABNORMAL HIGH (ref <15)

## 2024-10-13 MED ORDER — OXYCODONE-ACETAMINOPHEN 5-325 MG PO TABS
2.0000 | ORAL_TABLET | Freq: Once | ORAL | Status: AC
  Administered 2024-10-13: 2 via ORAL
  Filled 2024-10-13: qty 2

## 2024-10-13 MED ORDER — BACITRACIN ZINC 500 UNIT/GM EX OINT
TOPICAL_OINTMENT | Freq: Two times a day (BID) | CUTANEOUS | Status: DC
  Filled 2024-10-13: qty 0.9

## 2024-10-13 MED ORDER — TETANUS-DIPHTH-ACELL PERTUSSIS 5-2-15.5 LF-MCG/0.5 IM SUSP
0.5000 mL | Freq: Once | INTRAMUSCULAR | Status: AC
  Administered 2024-10-13: 0.5 mL via INTRAMUSCULAR
  Filled 2024-10-13: qty 0.5

## 2024-10-13 MED ORDER — ONDANSETRON 4 MG PO TBDP: 4.0000 mg | ORAL_TABLET | Freq: Once | ORAL | Status: DC

## 2024-10-13 MED ORDER — BACITRACIN ZINC 500 UNIT/GM EX OINT: TOPICAL_OINTMENT | Freq: Two times a day (BID) | CUTANEOUS | Status: DC

## 2024-10-13 MED ORDER — ONDANSETRON HCL 4 MG/2ML IJ SOLN
4.0000 mg | Freq: Once | INTRAMUSCULAR | Status: AC
  Administered 2024-10-13: 4 mg via INTRAVENOUS
  Filled 2024-10-13: qty 2
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
## 2024-10-13 NOTE — Discharge Instructions (Signed)
 Your CT scan show a small amount of blood around the brain. Repeat CT scan looks stable. Please avoid triggers that worsen symptoms like excessive screen time, loud noises or heavy exercise.  I would recommend coming back to ER with new or worsening symptoms.

## 2024-10-13 NOTE — ED Notes (Signed)
 Pt is tearful and agitated
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
## 2024-10-31 ENCOUNTER — Ambulatory Visit: Admitting: Women's Health

## 2024-10-31 ENCOUNTER — Other Ambulatory Visit (HOSPITAL_COMMUNITY)
Admission: RE | Admit: 2024-10-31 | Discharge: 2024-10-31 | Disposition: A | Source: Ambulatory Visit | Attending: Women's Health | Admitting: Women's Health

## 2024-10-31 ENCOUNTER — Encounter: Payer: Self-pay | Admitting: Women's Health

## 2024-10-31 VITALS — BP 117/72 | HR 75 | Ht 63.0 in | Wt 166.0 lb

## 2024-10-31 DIAGNOSIS — Z8619 Personal history of other infectious and parasitic diseases: Secondary | ICD-10-CM | POA: Diagnosis present

## 2024-10-31 DIAGNOSIS — R52 Pain, unspecified: Secondary | ICD-10-CM

## 2024-10-31 DIAGNOSIS — Z113 Encounter for screening for infections with a predominantly sexual mode of transmission: Secondary | ICD-10-CM | POA: Diagnosis present

## 2024-10-31 DIAGNOSIS — M549 Dorsalgia, unspecified: Secondary | ICD-10-CM

## 2024-10-31 DIAGNOSIS — M545 Low back pain, unspecified: Secondary | ICD-10-CM

## 2024-10-31 LAB — POCT URINALYSIS DIPSTICK OB
Blood, UA: NEGATIVE
Glucose, UA: NEGATIVE
Ketones, UA: NEGATIVE
Leukocytes, UA: NEGATIVE
Nitrite, UA: NEGATIVE
POC,PROTEIN,UA: NEGATIVE

## 2024-10-31 NOTE — Progress Notes (Signed)
 Work-in GYN VISIT Patient name: Isabel Conner MRN [id number]  Date of birth: [birth year] Chief Complaint:   Back Pain (Thing might have uti)  History of Present Illness:   Isabel Conner is a 28 y.o. [phone] female being seen today as work-in for report of constant Lt lower back pain x 3d, thinks may be UTI. No frequency/burning/discomfort. Pain during sex last night in same area and around to front some. Denies abnormal discharge, itching/odor/irritation.  Had +CT in Sept, no poc. Had Rt salpingectomy d/t Rt ovarian cyst in Sept, some occ sharp pains in that area. Lt side feels like that some.  Patient's last menstrual period was 10/07/2024 (exact date). The current method of family planning is OCP (estrogen/progesterone).  Last pap 07/16/24. Results were: NILM w/ HRHPV negative     03/19/2021    9:53 AM  Depression screen PHQ 2/9  Decreased Interest 2  Down, Depressed, Hopeless 3  PHQ - 2 Score 5  Altered sleeping 2  Tired, decreased energy 2  Change in appetite 2  Feeling bad or failure about yourself  1  Trouble concentrating 0  Moving slowly or fidgety/restless 0  Suicidal thoughts 1  PHQ-9 Score 13      Data saved with a previous flowsheet row definition        03/19/2021    9:53 AM  GAD 7 : Generalized Anxiety Score  Nervous, Anxious, on Edge 0  Control/stop worrying 1  Worry too much - different things 1  Trouble relaxing 0  Restless 0  Easily annoyed or irritable 1  Afraid - awful might happen 1  Total GAD 7 Score 4     Review of Systems:   Pertinent items are noted in HPI Denies fever/chills, dizziness, headaches, visual disturbances, fatigue, shortness of breath, chest pain, abdominal pain, vomiting, abnormal vaginal discharge/itching/odor/irritation, problems with periods, bowel movements, urination, or intercourse unless otherwise stated above.  Pertinent History Reviewed:  Reviewed past medical,surgical, social, obstetrical and family history.  Reviewed  problem list, medications and allergies. Physical Assessment:   Vitals:   10/31/24 1339  BP: 117/72  Pulse: 75  Weight: 166 lb (75.3 kg)  Height: 5' 3 (1.6 m)  Body mass index is 29.41 kg/m.       Physical Examination:   General appearance: alert, well appearing, and in no distress  Mental status: alert, oriented to person, place, and time  Skin: warm & dry   Cardiovascular: normal heart rate noted  Respiratory: normal respiratory effort, no distress  Abdomen: soft, non-tender   Pelvic: VULVA: normal appearing vulva with no masses, tenderness or lesions, VAGINA: normal appearing vagina with normal color and discharge, no lesions, CERVIX: normal appearing cervix without discharge or lesions, UTERUS: uterus is normal size, shape, consistency and nontender, ADNEXA: normal adnexa in size, slightly tender Rt side and no masses  Extremities: no edema   Chaperone: Winton Cherry  Results for orders placed or performed in visit on 10/31/24 (from the past 24 hours)  POC Urinalysis Dipstick OB   Collection Time: 10/31/24  2:00 PM  Result Value Ref Range   Color, UA     Clarity, UA     Glucose, UA Negative Negative   Bilirubin, UA     Ketones, UA negative    Spec Grav, UA     Blood, UA negative    pH, UA     POC,PROTEIN,UA Negative Negative, Trace, Small (1+), Moderate (2+), Large (3+), 4+   Urobilinogen, UA  Nitrite, UA negative    Leukocytes, UA Negative Negative   Appearance     Odor      Assessment & Plan:  1) Constant Lt lower back pain> urine dipstick neg, no other UTI sx. Will send urine culture. If swab/cx neg, consider pelvic u/s (says feels like Rt side felt when had cyst). For now can alternate heat/ice, take ibuprofen  prn  2) Recent +CT> CV swab for STD screen  Meds: No orders of the defined types were placed in this encounter.   Orders Placed This Encounter  Procedures   Urine Culture   POC Urinalysis Dipstick OB    Return in about 1 year (around 10/31/2025)  for Physical.  Suzen JONELLE Fetters CNM, WHNP-BC 10/31/2024 2:19 PM

## 2024-11-01 LAB — CERVICOVAGINAL ANCILLARY ONLY
Bacterial Vaginitis (gardnerella): NEGATIVE
Candida Glabrata: NEGATIVE
Candida Vaginitis: POSITIVE — AB
Chlamydia: NEGATIVE
Comment: NEGATIVE
Comment: NEGATIVE
Comment: NEGATIVE
Comment: NEGATIVE
Comment: NEGATIVE
Comment: NORMAL
Neisseria Gonorrhea: NEGATIVE
Trichomonas: NEGATIVE

## 2024-11-03 LAB — URINE CULTURE

## 2024-11-05 ENCOUNTER — Ambulatory Visit: Payer: Self-pay | Admitting: Women's Health

## 2024-11-05 MED ORDER — FLUCONAZOLE 150 MG PO TABS: 150.0000 mg | ORAL_TABLET | Freq: Once | ORAL | 0 refills | Status: AC

## 2024-11-05 MED ORDER — NITROFURANTOIN MONOHYD MACRO 100 MG PO CAPS: 100.0000 mg | ORAL_CAPSULE | Freq: Two times a day (BID) | ORAL | 0 refills | Status: AC

## 2025-01-04 ENCOUNTER — Other Ambulatory Visit (HOSPITAL_COMMUNITY): Payer: Self-pay

## 2025-01-04 MED ORDER — ONDANSETRON HCL 4 MG PO TABS
4.0000 mg | ORAL_TABLET | Freq: Three times a day (TID) | ORAL | 0 refills | Status: AC | PRN
  Filled 2025-01-04: qty 20, 7d supply, fill #0

## 2025-01-04 MED ORDER — OXYCODONE HCL 5 MG PO TABS
5.0000 mg | ORAL_TABLET | ORAL | 0 refills | Status: AC | PRN
  Filled 2025-01-04: qty 10, 2d supply, fill #0

## 2025-01-04 MED ORDER — DOCUSATE SODIUM 100 MG PO CAPS
100.0000 mg | ORAL_CAPSULE | Freq: Two times a day (BID) | ORAL | 0 refills | Status: AC
  Filled 2025-01-04: qty 20, 10d supply, fill #0
